# Patient Record
Sex: Male | Born: 1940 | Race: White | Hispanic: No | Marital: Married | Smoking: Former smoker
Health system: Southern US, Community
[De-identification: ages and names within clinical notes are randomized; demographics above are authoritative.]

## PROBLEM LIST (undated history)

## (undated) DIAGNOSIS — I639 Cerebral infarction, unspecified: Secondary | ICD-10-CM

## (undated) DIAGNOSIS — E039 Hypothyroidism, unspecified: Secondary | ICD-10-CM

## (undated) DIAGNOSIS — Z860100 Personal history of colon polyps, unspecified: Secondary | ICD-10-CM

## (undated) DIAGNOSIS — N200 Calculus of kidney: Secondary | ICD-10-CM

## (undated) DIAGNOSIS — R569 Unspecified convulsions: Secondary | ICD-10-CM

## (undated) DIAGNOSIS — M1990 Unspecified osteoarthritis, unspecified site: Secondary | ICD-10-CM

## (undated) DIAGNOSIS — Z87442 Personal history of urinary calculi: Secondary | ICD-10-CM

## (undated) HISTORY — PX: VASECTOMY: SHX75

## (undated) HISTORY — PX: ELBOW SURGERY: SHX618

## (undated) HISTORY — PX: TONSILLECTOMY: SUR1361

---

## 2004-10-31 ENCOUNTER — Ambulatory Visit: Payer: Self-pay | Admitting: Internal Medicine

## 2004-10-31 ENCOUNTER — Ambulatory Visit (HOSPITAL_COMMUNITY): Admission: RE | Admit: 2004-10-31 | Discharge: 2004-10-31 | Payer: Self-pay | Admitting: Internal Medicine

## 2005-11-08 ENCOUNTER — Ambulatory Visit: Payer: Self-pay | Admitting: Internal Medicine

## 2006-05-09 ENCOUNTER — Ambulatory Visit: Payer: Self-pay | Admitting: Internal Medicine

## 2009-03-10 ENCOUNTER — Ambulatory Visit: Payer: Self-pay | Admitting: Vascular Surgery

## 2009-11-08 ENCOUNTER — Encounter (INDEPENDENT_AMBULATORY_CARE_PROVIDER_SITE_OTHER): Payer: Self-pay

## 2010-08-23 NOTE — Letter (Signed)
Summary: Recall, Screening Colonoscopy Only  [facility]  [address]   [place], [place] [address]   Phone: [phone]  Fax: [phone]    November 08, 2009  [name] [address] [place], [place]  [address] 04/27/1941   Dear Mr. Teehan,   Our records indicate it is time to schedule your colonoscopy.  Please call our office at [phone] and ask for the nurse to get triaged and scheduled.   Thank you,    Hendricks Limes, LPN Cloria Spring, LPN  [facility] Ph: [phone]   Fax: [phone]

## 2010-12-06 NOTE — Procedures (Signed)
LOWER EXTREMITY VENOUS REFLUX EXAM   INDICATION:  Right lower extremity varicose vein.   EXAM:  Using color-flow imaging and pulse Doppler spectral analysis, the  right common femoral, superficial femoral, popliteal, posterior tibial,  greater and lesser saphenous veins are evaluated.  There is evidence  suggesting deep venous insufficiency in the right lower extremity.   The right saphenofemoral junction is not competent.  The right GSV is  not competent with the caliber as described below.   The right proximal short saphenous vein demonstrates competency.   GSV Diameter (used if found to be incompetent only)                                            Right    Left  Proximal Greater Saphenous Vein           0.9 cm   cm  Proximal-to-mid-thigh                     cm       cm  Mid thigh                                 0.63 cm  cm  Mid-distal thigh                          cm       cm  Distal thigh                              0.5 cm   cm  Knee                                      0.4 cm   cm   IMPRESSION:  1. Right greater saphenous vein reflux is identified as described      above and on the attached worksheet.  2. The right greater saphenous vein is not tortuous.  3. The deep venous system is not competent at the right superficial      femoral and popliteal vein levels.  4. The right lesser saphenous vein is not competent.   ___________________________________________  Larina Earthly, M.D.   CH/MEDQ  D:  03/10/2009  T:  03/11/2009  Job:  [id number]

## 2010-12-06 NOTE — Consult Note (Signed)
NEW PATIENT CONSULTATION   [name], [name]  DOB:  [birth year]                                       03/10/2009  CHART#:[id number]   [name] presents today for evaluation of right leg venous  varicosities.  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  He does have some mild  aching sensation over this but was more concerned as to whether or not  this was a sing of a more serious complication.  He does not have any  history of bleeding and does not have any history of deep venous  thrombosis.  He is quite active and he walks several miles several times  per week.   PAST MEDICAL HISTORY:  Negative for diabetes, cardiac disease, elevated  cholesterol.   FAMILY HISTORY:  Negative for premature atherosclerotic carotid disease.   SOCIAL HISTORY:  He is married with 2 children.  He is retired.  He quit  smoking 30 years ago.  He does not drink alcohol on a regular basis.   REVIEW OF SYSTEMS:  Noted for a weight of 178 pounds.  He is 5 foot 8  inches tall.  Negative cardiac, pulmonary, GI.  He does have urinary  frequency.  No history of seizures.   MEDICATION ALLERGIES:  None.   CURRENT MEDICATIONS:  Listed in his office chart.   PHYSICAL EXAM:  Blood pressure 130/68, pulse 56, respirations 18.  His  radial and dorsalis pedis pulse are 2+ bilaterally.  He does have a few  scattered varicosities in his left medial calf.  He does have marked  varicosities throughout his right medial calf and right distal thigh.  He does not have any skin changes of venous hypertension.   He underwent a focal venous duplex today and this reveals reflux  throughout his right great saphenous vein and he has mild reflux in his  right popliteal vein.  I discussed the significance of this with [name].  I explained that this is a slowly progressive  process.  I  explained the indications for fixing this is progressive pain or concern  regarding appearance.  He is not concerned regarding appearance and has  mild symptoms related to this currently.  He knows to look for signs of  progressive changes and will notify us should he wish further evaluation  and treatment.  He understands the importance of elevation when  possible.   Larina Earthly, M.D.  Electronically Signed   TFE/MEDQ  D:  03/10/2009  T:  03/11/2009  Job:  [id number]   cc:   Selinda Flavin

## 2010-12-09 NOTE — Op Note (Signed)
NAME:  [name], [name]                ACCOUNT NO.:  [id number]   MEDICAL RECORD NO.:  [id number]          PATIENT TYPE:  AMB   LOCATION:  DAY                           FACILITY:  [facility]   PHYSICIAN:  Lionel December, M.D.    DATE OF BIRTH:  1940/08/26   DATE OF PROCEDURE:  10/31/2004  DATE OF DISCHARGE:                                 OPERATIVE REPORT   PROCEDURE:  Colonoscopy.   INDICATION:  [name] is a 70 year old Caucasian male who is here for high-risk  screening colonoscopy. His last exam was six years ago. His mother who is  now 37, had surgery for colon carcinoma at age 26. He denies change in his  bowel habits or rectal bleeding. Procedure risks were reviewed with the  patient, informed consent was obtained.   PREMEDICATION:  Demerol 25 milligrams IV, Versed 5 milligrams IV in divided  dose.   FINDINGS:  Procedure performed in endoscopy suite. The patient's vital signs  and O2 sat were monitored during the procedure and remained stable. The  patient was placed left lateral position and rectal examination performed.  No abnormality noted external or digital exam. Olympus videoscope was placed  rectum and advanced under vision into sigmoid colon and beyond. Preparation  was excellent except he had some stool in the ascending colon and cecum.  Ileocecal valve was well seen. He had to be turnaround to move the stool  around and the blind end of the cecum was examined and no abnormality was  noted. Unable to clearly see the appendiceal orifice because of stool.  Pictures taken for the record. As the scope was withdrawn colonic mucosa was  carefully examined and was normal except two tiny diverticula at sigmoid  colon. Rectal mucosa was normal. Scope was retroflexed to examine anorectal  junction which was unremarkable. Endoscope was then withdrawn. The patient  tolerated the procedure well.   FINAL DIAGNOSES:  1.  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  2.  Examination of the cecum  was somewhat suboptimal because of formed      stool.   RECOMMENDATIONS:  Yearly Hemoccults.  High-fiber diet. He should have next  screening exam in no later than five years from now. October 30, 2004      NR/MEDQ  D:  10/31/2004  T:  10/31/2004  Job:  [id number]   cc:   Selinda Flavin  [address]  [place]  [place] [address]  Fax: [phone]

## 2011-07-31 DIAGNOSIS — E785 Hyperlipidemia, unspecified: Secondary | ICD-10-CM | POA: Diagnosis not present

## 2011-07-31 DIAGNOSIS — Z0000 Encounter for general adult medical examination without abnormal findings: Secondary | ICD-10-CM | POA: Diagnosis not present

## 2011-07-31 DIAGNOSIS — Z79899 Other long term (current) drug therapy: Secondary | ICD-10-CM | POA: Diagnosis not present

## 2011-07-31 DIAGNOSIS — N320 Bladder-neck obstruction: Secondary | ICD-10-CM | POA: Diagnosis not present

## 2011-08-10 ENCOUNTER — Encounter (INDEPENDENT_AMBULATORY_CARE_PROVIDER_SITE_OTHER): Payer: Self-pay | Admitting: *Deleted

## 2011-08-24 ENCOUNTER — Other Ambulatory Visit (INDEPENDENT_AMBULATORY_CARE_PROVIDER_SITE_OTHER): Payer: Self-pay | Admitting: *Deleted

## 2011-08-24 ENCOUNTER — Telehealth (INDEPENDENT_AMBULATORY_CARE_PROVIDER_SITE_OTHER): Payer: Self-pay | Admitting: *Deleted

## 2011-08-24 DIAGNOSIS — Z800 Family history of malignant neoplasm of digestive organs: Secondary | ICD-10-CM

## 2011-08-24 NOTE — Telephone Encounter (Signed)
Patient needs movi prep 

## 2011-08-25 MED ORDER — PEG-KCL-NACL-NASULF-NA ASC-C 100 G PO SOLR: 1.0000 | Freq: Once | ORAL | Status: DC

## 2011-09-19 ENCOUNTER — Encounter (INDEPENDENT_AMBULATORY_CARE_PROVIDER_SITE_OTHER): Payer: Self-pay | Admitting: *Deleted

## 2011-09-21 ENCOUNTER — Encounter (INDEPENDENT_AMBULATORY_CARE_PROVIDER_SITE_OTHER): Payer: Self-pay | Admitting: *Deleted

## 2011-09-21 DIAGNOSIS — J0100 Acute maxillary sinusitis, unspecified: Secondary | ICD-10-CM | POA: Diagnosis not present

## 2011-10-30 ENCOUNTER — Telehealth (INDEPENDENT_AMBULATORY_CARE_PROVIDER_SITE_OTHER): Payer: Self-pay | Admitting: *Deleted

## 2011-10-30 NOTE — Telephone Encounter (Signed)
Requesting MD: Dimas Aguas  PCP:  howard     Name & DOB: [name] [birth year]     Procedure: tcs  Reason/Indication:  fam hx colon ca  Has patient had this procedure before?  yes  If so, when, by whom and where?  2006  Is there a family history of colon cancer?  yes  Who?  What age when diagnosed?  mother  Is patient diabetic?   no      Does patient have prosthetic heart valve?  no  Do you have a pacemaker?  no  Has patient had joint replacement within last 12 months?  no  Is patient on Coumadin, Plavix and/or Aspirin? yes  Medications: asa 81 mg daily, zonegran 100 mg qid, oxcarbazepine 300 mg bid, multi vit   Allergies: nkda  Pharmacy: [facility]  Medication Adjustment: asa 2 days  Procedure date & time: 11/15/11 at 1030

## 2011-11-01 NOTE — Telephone Encounter (Signed)
agree

## 2011-11-14 MED ORDER — SODIUM CHLORIDE 0.45 % IV SOLN
Freq: Once | INTRAVENOUS | Status: AC
  Administered 2011-11-15: 10:00:00 via INTRAVENOUS

## 2011-11-15 ENCOUNTER — Encounter (HOSPITAL_COMMUNITY): Payer: Self-pay | Admitting: *Deleted

## 2011-11-15 ENCOUNTER — Encounter (HOSPITAL_COMMUNITY)
Admission: RE | Disposition: A | Discharge status: Home / Self Care | Payer: Self-pay | Source: Ambulatory Visit | Attending: Internal Medicine

## 2011-11-15 ENCOUNTER — Ambulatory Visit (HOSPITAL_COMMUNITY)
Admission: RE | Admit: 2011-11-15 | Discharge: 2011-11-15 | Disposition: A | Discharge status: Home / Self Care | Payer: Medicare Other | Source: Ambulatory Visit | Attending: Internal Medicine | Admitting: Internal Medicine

## 2011-11-15 DIAGNOSIS — Z7982 Long term (current) use of aspirin: Secondary | ICD-10-CM | POA: Diagnosis not present

## 2011-11-15 DIAGNOSIS — K644 Residual hemorrhoidal skin tags: Secondary | ICD-10-CM

## 2011-11-15 DIAGNOSIS — K5730 Diverticulosis of large intestine without perforation or abscess without bleeding: Secondary | ICD-10-CM

## 2011-11-15 DIAGNOSIS — Z1211 Encounter for screening for malignant neoplasm of colon: Secondary | ICD-10-CM | POA: Insufficient documentation

## 2011-11-15 DIAGNOSIS — D126 Benign neoplasm of colon, unspecified: Secondary | ICD-10-CM | POA: Insufficient documentation

## 2011-11-15 DIAGNOSIS — Z800 Family history of malignant neoplasm of digestive organs: Secondary | ICD-10-CM | POA: Insufficient documentation

## 2011-11-15 HISTORY — DX: Unspecified convulsions: R56.9

## 2011-11-15 HISTORY — PX: COLONOSCOPY: SHX5424

## 2011-11-15 SURGERY — COLONOSCOPY
Anesthesia: Moderate Sedation

## 2011-11-15 MED ORDER — MIDAZOLAM HCL 5 MG/5ML IJ SOLN
INTRAMUSCULAR | Status: AC
  Filled 2011-11-15: qty 10

## 2011-11-15 MED ORDER — MEPERIDINE HCL 50 MG/ML IJ SOLN
INTRAMUSCULAR | Status: DC | PRN
  Administered 2011-11-15 (×2): 25 mg via INTRAVENOUS

## 2011-11-15 MED ORDER — STERILE WATER FOR IRRIGATION IR SOLN
Status: DC | PRN
  Administered 2011-11-15: 12:00:00

## 2011-11-15 MED ORDER — MIDAZOLAM HCL 5 MG/5ML IJ SOLN
INTRAMUSCULAR | Status: DC | PRN
  Administered 2011-11-15: 1 mg via INTRAVENOUS
  Administered 2011-11-15: 2 mg via INTRAVENOUS

## 2011-11-15 MED ORDER — MEPERIDINE HCL 50 MG/ML IJ SOLN
INTRAMUSCULAR | Status: AC
  Filled 2011-11-15: qty 1

## 2011-11-15 NOTE — H&P (Signed)
[name] is an 71 y.o. male.   Chief Complaint: Patient is here for colonoscopy. HPI: This 71 year old Caucasian male who is in for a screening colonoscopy. His last exam was in April 2006. He denies abdominal pain change in his bowel habits or rectal bleeding. His family history significant for colon carcinoma in his mother with surgery at in her 67s and lived to be 40.  Past Medical History  Diagnosis Date  . Seizures     Past Surgical History  Procedure Date  . Tonsillectomy   . Elbow surgery     chipped bone right elbow  . Vasectomy     History reviewed. No pertinent family history. Social History:  does not have a smoking history on file. He does not have any smokeless tobacco history on file. He reports that he drinks about .6 ounces of alcohol per week. He reports that he does not use illicit drugs.  Allergies: Not on File  Medications Prior to Admission  Medication Sig Dispense Refill  . aspirin 81 MG tablet Take 81 mg by mouth daily.      . Oxcarbazepine (TRILEPTAL) 300 MG tablet Take 300 mg by mouth 2 (two) times daily.      Marland Kitchen zonisamide (ZONEGRAN) 100 MG capsule Take 400 mg by mouth daily.      . peg 3350 powder (MOVIPREP) 100 G SOLR Take 1 kit (100 g total) by mouth once.  1 kit  0    No results found for this or any previous visit (from the past 48 hour(s)). No results found.  Review of Systems  Constitutional: Negative for weight loss.  Gastrointestinal: Negative for abdominal pain, diarrhea, constipation, blood in stool and melena.    Blood pressure 125/82, pulse 70, temperature 97.6 F (36.4 C), temperature source Oral, resp. rate 19, height 5\' 8"  (1.727 m), weight 177 lb (80.287 kg). Physical Exam  Constitutional: He appears well-developed and well-nourished.  HENT:  Mouth/Throat: Oropharynx is clear and moist.  Eyes: Conjunctivae are normal. No scleral icterus.  Neck: No thyromegaly present.  Cardiovascular: Normal rate, regular rhythm and normal  heart sounds.   No murmur heard. Respiratory: Effort normal and breath sounds normal.  GI: He exhibits no distension and no mass. There is no tenderness.  Musculoskeletal: He exhibits no edema.  Lymphadenopathy:    He has no cervical adenopathy.  Neurological: He is alert.  Skin: Skin is warm and dry.     Assessment/Plan High-risk screening colonoscopy  Joely Losier U 11/15/2011, 11:38 AM

## 2011-11-15 NOTE — Discharge Instructions (Signed)
No aspirin for one week. Resume other medications as before. Resume usual diet. No driving for 24 hours. Physician Will contact him with biopsy results.  Colonoscopy Care After Read the instructions outlined below and refer to this sheet in the next few weeks. These discharge instructions provide you with general information on caring for yourself after you leave the hospital. Your doctor may also give you specific instructions. While your treatment has been planned according to the most current medical practices available, unavoidable complications occasionally occur. If you have any problems or questions after discharge, call your doctor. HOME CARE INSTRUCTIONS ACTIVITY:  You may resume your regular activity, but move at a slower pace for the next 24 hours.   Take frequent rest periods for the next 24 hours.   Walking will help get rid of the air and reduce the bloated feeling in your belly (abdomen).   No driving for 24 hours (because of the medicine (anesthesia) used during the test).   You may shower.   Do not sign any important legal documents or operate any machinery for 24 hours (because of the anesthesia used during the test).  NUTRITION:  Drink plenty of fluids.   You may resume your normal diet as instructed by your doctor.   Begin with a light meal and progress to your normal diet. Heavy or fried foods are harder to digest and may make you feel sick to your stomach (nauseated).   Avoid alcoholic beverages for 24 hours or as instructed.  MEDICATIONS:  You may resume your normal medications unless your doctor tells you otherwise.  WHAT TO EXPECT TODAY:  Some feelings of bloating in the abdomen.   Passage of more gas than usual.   Spotting of blood in your stool or on the toilet paper.  IF YOU HAD POLYPS REMOVED DURING THE COLONOSCOPY:  No aspirin products for 7 days or as instructed.   No alcohol for 7 days or as instructed.   Eat a soft diet for the next 24  hours.  FINDING OUT THE RESULTS OF YOUR TEST Not all test results are available during your visit. If your test results are not back during the visit, make an appointment with your caregiver to find out the results. Do not assume everything is normal if you have not heard from your caregiver or the medical facility. It is important for you to follow up on all of your test results.  SEEK IMMEDIATE MEDICAL CARE IF:  You have more than a spotting of blood in your stool.   Your belly is swollen (abdominal distention).   You are nauseated or vomiting.   You have a fever.   You have abdominal pain or discomfort that is severe or gets worse throughout the day.  Document Released: 02/22/2004 Document Revised: 06/29/2011 Document Reviewed: 02/20/2008 [facility] Patient Information 2012 [place], [place].

## 2011-11-15 NOTE — Op Note (Signed)
COLONOSCOPY PROCEDURE REPORT  PATIENT:  [name]  MR#:  [id number] Birthdate:  [birth year], 71 y.o., male Endoscopist:  Dr. Malissa Hippo, MD Referred By:  Dr. Gillie Manners. Dimas Aguas, MD Procedure Date: 11/15/2011  Procedure:   Colonoscopy with snare polypectomy.  Indications:  Patient is a 71 year old Caucasian male who is undergoing high-risk screening colonoscopy. Patient's mother was diagnosed with colon carcinoma in her 25s. Patient's last exam was in April 2006.  Informed Consent:  The procedure and risks were reviewed with the patient and informed consent was obtained.  Medications:  Demerol 50 mg IV Versed 3 mg IV  Description of procedure:  After a digital rectal exam was performed, that colonoscope was advanced from the anus through the rectum and colon to the area of the cecum, ileocecal valve and appendiceal orifice. The cecum was deeply intubated. These structures were well-seen and photographed for the record. From the level of the cecum and ileocecal valve, the scope was slowly and cautiously withdrawn. The mucosal surfaces were carefully surveyed utilizing scope tip to flexion to facilitate fold flattening as needed. The scope was pulled down into the rectum where a thorough exam including retroflexion was performed.  Findings:   Prep satisfactory. 6 mm polyp snared from proximal transverse colon. Most of the polyp coagulated during this process. Another 6 mm polyp snared from proximal transverse colon and once again most of the polyp was coagulated during this process. Tissue from both of these polyps weren't submitted together. Small polyp at splenic flexure was coagulated using snare tip. Few small diverticula and sigmoid colon. Small hemorrhoids below the dentate line.  Therapeutic/Diagnostic Maneuvers Performed:  See above  Complications:  None  Cecal Withdrawal Time:  22 minutes  Impression:  Examination performed to cecum. Two small polyps were snared from  proximal transverse colon mostly coagulated. Tissue from both of these polyps was submitted together. Another small polyp at splenic flexure was coagulated. Few small diverticula sigmoid colon and external hemorrhoids.  Recommendations:  Standard instructions given. I will be contacting patient with results of biopsy.  Laketha Leopard U  11/15/2011 12:20 PM  CC: Dr. Criss Rosales, MD, MD & Dr. Bonnetta Barry ref. provider found

## 2011-11-17 ENCOUNTER — Encounter (HOSPITAL_COMMUNITY): Payer: Self-pay | Admitting: Internal Medicine

## 2011-11-22 ENCOUNTER — Encounter (INDEPENDENT_AMBULATORY_CARE_PROVIDER_SITE_OTHER): Payer: Self-pay | Admitting: *Deleted

## 2012-03-22 DIAGNOSIS — R42 Dizziness and giddiness: Secondary | ICD-10-CM | POA: Diagnosis not present

## 2012-04-05 DIAGNOSIS — I658 Occlusion and stenosis of other precerebral arteries: Secondary | ICD-10-CM | POA: Diagnosis not present

## 2012-04-05 DIAGNOSIS — G459 Transient cerebral ischemic attack, unspecified: Secondary | ICD-10-CM | POA: Diagnosis not present

## 2012-06-28 DIAGNOSIS — Z23 Encounter for immunization: Secondary | ICD-10-CM | POA: Diagnosis not present

## 2012-07-03 DIAGNOSIS — N200 Calculus of kidney: Secondary | ICD-10-CM | POA: Diagnosis not present

## 2012-07-03 DIAGNOSIS — I517 Cardiomegaly: Secondary | ICD-10-CM | POA: Diagnosis not present

## 2012-07-03 DIAGNOSIS — K4020 Bilateral inguinal hernia, without obstruction or gangrene, not specified as recurrent: Secondary | ICD-10-CM | POA: Diagnosis not present

## 2012-07-03 DIAGNOSIS — Z7982 Long term (current) use of aspirin: Secondary | ICD-10-CM | POA: Diagnosis not present

## 2012-07-03 DIAGNOSIS — Z79899 Other long term (current) drug therapy: Secondary | ICD-10-CM | POA: Diagnosis not present

## 2012-07-04 DIAGNOSIS — N200 Calculus of kidney: Secondary | ICD-10-CM | POA: Diagnosis not present

## 2012-07-04 DIAGNOSIS — K4020 Bilateral inguinal hernia, without obstruction or gangrene, not specified as recurrent: Secondary | ICD-10-CM | POA: Diagnosis not present

## 2012-07-05 DIAGNOSIS — R300 Dysuria: Secondary | ICD-10-CM | POA: Diagnosis not present

## 2012-07-05 DIAGNOSIS — R112 Nausea with vomiting, unspecified: Secondary | ICD-10-CM | POA: Diagnosis not present

## 2012-07-05 DIAGNOSIS — R1011 Right upper quadrant pain: Secondary | ICD-10-CM | POA: Diagnosis not present

## 2012-07-05 DIAGNOSIS — K828 Other specified diseases of gallbladder: Secondary | ICD-10-CM | POA: Diagnosis not present

## 2012-07-10 ENCOUNTER — Encounter (INDEPENDENT_AMBULATORY_CARE_PROVIDER_SITE_OTHER): Payer: Self-pay

## 2012-07-11 ENCOUNTER — Encounter (INDEPENDENT_AMBULATORY_CARE_PROVIDER_SITE_OTHER): Payer: Self-pay | Admitting: General Surgery

## 2012-07-12 ENCOUNTER — Telehealth (INDEPENDENT_AMBULATORY_CARE_PROVIDER_SITE_OTHER): Payer: Self-pay | Admitting: General Surgery

## 2012-07-12 ENCOUNTER — Encounter (INDEPENDENT_AMBULATORY_CARE_PROVIDER_SITE_OTHER): Payer: Self-pay | Admitting: General Surgery

## 2012-07-12 ENCOUNTER — Ambulatory Visit (INDEPENDENT_AMBULATORY_CARE_PROVIDER_SITE_OTHER): Payer: Medicare Other | Admitting: General Surgery

## 2012-07-12 VITALS — BP 118/82 | HR 66 | Temp 98.3°F | Resp 16 | Ht 68.0 in | Wt 178.4 lb

## 2012-07-12 DIAGNOSIS — R1011 Right upper quadrant pain: Secondary | ICD-10-CM | POA: Insufficient documentation

## 2012-07-12 NOTE — Progress Notes (Signed)
Dr. Derrell Lolling: when you can please, we need orders put in [facility] on Dominiq Sunderlin Surg is 07/26/11 - coming for preop 07/19/12 Thank you

## 2012-07-12 NOTE — Telephone Encounter (Signed)
Called to see if patient could come in sooner for today's appt per Dr.Ramirez..called home and mobile phone lmom

## 2012-07-12 NOTE — Progress Notes (Signed)
Patient ID: [name], male   DOB: 04/19/1941, 71 y.o.   MRN: [id number]  Chief Complaint  Patient presents with  . Abdominal Pain    Evaluate gallbladder    HPI [name] is a 71 y.o. male.  Who is referred by Dr. Howard for evaluation secondary to right quadrant pain. Patient procedure ED secondary to midback pain was evaluated for possible cardiac event which is negative.  The patient is a CT scan which revealed no stones, and the patient was followed up by his PCP and underwent ultrasound which revealed thickening of the gallbladder. Patient also states that he had labs that showed symptoms of possible cholelithiasis, the laps were not sent did not have access to see.  The patient after pain had soreness in his right upper quadrant. He states this was his first occurrence. HPI  Past Medical History  Diagnosis Date  . Seizures     Past Surgical History  Procedure Date  . Tonsillectomy   . Elbow surgery     chipped bone right elbow  . Vasectomy   . Colonoscopy 11/15/2011    Procedure: COLONOSCOPY;  Surgeon: Najeeb U Rehman, MD;  Location: [facility];  Service: Endoscopy;  Laterality: N/A;  730    No family history on file.  Social History History  Substance Use Topics  . Smoking status: Never Smoker   . Smokeless tobacco: Not on file  . Alcohol Use: 0.6 oz/week    1 Glasses of wine per week     Comment: mixed drink  once in awhile    No Known Allergies  Current Outpatient Prescriptions  Medication Sig Dispense Refill  . aspirin 81 MG tablet Take 81 mg by mouth daily.      . fluticasone (FLONASE) 50 MCG/ACT nasal spray Place 2 sprays into the nose daily.      . multivitamin-iron-minerals-folic acid (CENTRUM) chewable tablet Chew 1 tablet by mouth daily.      . Oxcarbazepine (TRILEPTAL) 300 MG tablet Take 300 mg by mouth 2 (two) times daily.      . zonisamide (ZONEGRAN) 100 MG capsule Take 400 mg by mouth daily.        Review of Systems Review of Systems    Constitutional: Negative.   HENT: Negative.   Respiratory: Negative.   Cardiovascular: Negative.   Gastrointestinal: Negative.   Neurological: Negative.     Blood pressure 118/82, pulse 66, temperature 98.3 F (36.8 C), temperature source Temporal, resp. rate 16, height 5' 8" (1.727 m), weight 178 lb 6.4 oz (80.922 kg).  Physical Exam Physical Exam  Constitutional: He is oriented to person, place, and time. He appears well-developed and well-nourished.  HENT:  Head: Normocephalic and atraumatic.  Eyes: Conjunctivae normal and EOM are normal. Pupils are equal, round, and reactive to light.  Neck: Normal range of motion. Neck supple.  Cardiovascular: Normal rate and normal heart sounds.   Pulmonary/Chest: Effort normal and breath sounds normal.  Abdominal: Soft. Bowel sounds are normal. There is no tenderness. There is no rebound.  Musculoskeletal: Normal range of motion.  Neurological: He is alert and oriented to person, place, and time.    Data Reviewed Ultrasound:revealed gallbladder wall thickening no stones or sludge per ultrasound.  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    Plan    1. After discussing options with the patient the patient would like to proceed preoperative for laparoscopic   cholecystectomy. I discussed with the patient that this may not resolve his right quadrant pain or pain he had to come to the ED. He voiced understanding and wished to proceed.  2.All risks and benefits were discussed with the patient, to generally include infection, bleeding, damage to surrounding structures, and recurrence. Alternatives were offered and described.  All questions were answered and the patient voiced understanding of the procedure and wishes to proceed at this point.        Maijor Hornig Jr., [name] 07/12/2012, 2:28 PM    

## 2012-07-15 ENCOUNTER — Other Ambulatory Visit (INDEPENDENT_AMBULATORY_CARE_PROVIDER_SITE_OTHER): Payer: Self-pay | Admitting: General Surgery

## 2012-07-16 ENCOUNTER — Encounter (HOSPITAL_COMMUNITY): Payer: Self-pay | Admitting: Pharmacy Technician

## 2012-07-19 ENCOUNTER — Encounter (HOSPITAL_COMMUNITY)
Admission: RE | Admit: 2012-07-19 | Discharge: 2012-07-19 | Disposition: A | Discharge status: Home / Self Care | Payer: Medicare Other | Source: Ambulatory Visit | Attending: General Surgery | Admitting: General Surgery

## 2012-07-19 ENCOUNTER — Encounter (HOSPITAL_COMMUNITY): Payer: Self-pay

## 2012-07-19 DIAGNOSIS — K8010 Calculus of gallbladder with chronic cholecystitis without obstruction: Secondary | ICD-10-CM | POA: Diagnosis not present

## 2012-07-19 DIAGNOSIS — Z01812 Encounter for preprocedural laboratory examination: Secondary | ICD-10-CM | POA: Diagnosis not present

## 2012-07-19 HISTORY — DX: Personal history of colonic polyps: Z86.010

## 2012-07-19 HISTORY — DX: Personal history of colon polyps, unspecified: Z86.0100

## 2012-07-19 HISTORY — DX: Calculus of kidney: N20.0

## 2012-07-19 LAB — CBC
MCH: 30 pg (ref 26.0–34.0)
MCV: 89.5 fL (ref 78.0–100.0)
Platelets: 329 10*3/uL (ref 150–400)
RDW: 12.6 % (ref 11.5–15.5)
WBC: 7.7 10*3/uL (ref 4.0–10.5)

## 2012-07-19 LAB — BASIC METABOLIC PANEL
Calcium: 10.1 mg/dL (ref 8.4–10.5)
Chloride: 105 mEq/L (ref 96–112)
Creatinine, Ser: 1.02 mg/dL (ref 0.50–1.35)
GFR calc Af Amer: 85 mL/min — ABNORMAL LOW (ref >90)
GFR calc non Af Amer: 73 mL/min — ABNORMAL LOW (ref >90)

## 2012-07-19 NOTE — Patient Instructions (Addendum)
20 [name]  07/19/2012   Your procedure is scheduled on:  1-2 -2014  Report to [facility] at   0700     AM .  Call this number if you have problems the morning of surgery: [phone]  Or Presurgical Testing [phone](Savera Donson)   Remember: Follow any bowel prep instructions per MD office. For Cpap use: Bring mask and tubing only.   Do not eat food:After Midnight.    Take these medicines the morning of surgery with A SIP OF WATER: Trileptal, Zonegram   Do not wear jewelry, make-up or nail polish.  Do not wear lotions, powders, or perfumes. You may wear deodorant.  Do not shave 48 hours prior to surgery.(face and neck okay, no shaving of legs)  Do not bring valuables to the hospital.  Contacts, dentures or bridgework,body piercing,  may not be worn into surgery.  Leave suitcase in the car. After surgery it may be brought to your room.  For patients admitted to the hospital, checkout time is 11:00 AM the day of discharge.   Patients discharged the day of surgery will not be allowed to drive home. Must have responsible person with you x 24 hours once discharged.  Name and phone number of your driver: [phone] spouse -Psychologist, sport and exercise  Special Instructions: CHG Shower Use Special Wash: see special instructions.(avoid face and genitals)   Please read over the following fact sheets that you were given: MRSA Information.    Failure to follow these instructions may result in Cancellation of your surgery.   Patient signature_______________________________________________________

## 2012-07-19 NOTE — Pre-Procedure Instructions (Addendum)
07-19-12 EKG(07-03-12) with chart. 07-19-12 1550 pt. Notified Positive PCR screen for Staph aureus- will use Mupirocin Oint  As directed. W. Kennon Portela

## 2012-07-24 MED ORDER — CEFAZOLIN SODIUM-DEXTROSE 2-3 GM-% IV SOLR
2.0000 g | INTRAVENOUS | Status: AC
  Administered 2012-07-25: 2 g via INTRAVENOUS

## 2012-07-25 ENCOUNTER — Ambulatory Visit (HOSPITAL_COMMUNITY): Payer: Medicare Other | Admitting: Anesthesiology

## 2012-07-25 ENCOUNTER — Ambulatory Visit (HOSPITAL_COMMUNITY)
Admission: RE | Admit: 2012-07-25 | Discharge: 2012-07-25 | Disposition: A | Discharge status: Home / Self Care | Payer: Medicare Other | Source: Ambulatory Visit | Attending: General Surgery | Admitting: General Surgery

## 2012-07-25 ENCOUNTER — Ambulatory Visit (HOSPITAL_COMMUNITY): Payer: Medicare Other

## 2012-07-25 ENCOUNTER — Encounter (HOSPITAL_COMMUNITY): Payer: Self-pay | Admitting: Anesthesiology

## 2012-07-25 ENCOUNTER — Encounter (HOSPITAL_COMMUNITY): Payer: Self-pay | Admitting: *Deleted

## 2012-07-25 ENCOUNTER — Encounter (HOSPITAL_COMMUNITY)
Admission: RE | Disposition: A | Discharge status: Home / Self Care | Payer: Self-pay | Source: Ambulatory Visit | Attending: General Surgery

## 2012-07-25 DIAGNOSIS — K8010 Calculus of gallbladder with chronic cholecystitis without obstruction: Secondary | ICD-10-CM | POA: Insufficient documentation

## 2012-07-25 DIAGNOSIS — K838 Other specified diseases of biliary tract: Secondary | ICD-10-CM | POA: Diagnosis not present

## 2012-07-25 DIAGNOSIS — Z01812 Encounter for preprocedural laboratory examination: Secondary | ICD-10-CM | POA: Diagnosis not present

## 2012-07-25 DIAGNOSIS — K828 Other specified diseases of gallbladder: Secondary | ICD-10-CM | POA: Diagnosis not present

## 2012-07-25 HISTORY — PX: CHOLECYSTECTOMY: SHX55

## 2012-07-25 SURGERY — LAPAROSCOPIC CHOLECYSTECTOMY WITH INTRAOPERATIVE CHOLANGIOGRAM
Anesthesia: General | Wound class: Clean Contaminated

## 2012-07-25 MED ORDER — NEOSTIGMINE METHYLSULFATE 1 MG/ML IJ SOLN
INTRAMUSCULAR | Status: DC | PRN
  Administered 2012-07-25: 4 mg via INTRAVENOUS

## 2012-07-25 MED ORDER — OXYCODONE-ACETAMINOPHEN 5-325 MG PO TABS: 1.0000 | ORAL_TABLET | ORAL | Status: DC | PRN

## 2012-07-25 MED ORDER — FENTANYL CITRATE 0.05 MG/ML IJ SOLN
25.0000 ug | INTRAMUSCULAR | Status: DC | PRN
  Administered 2012-07-25 (×3): 50 ug via INTRAVENOUS

## 2012-07-25 MED ORDER — ONDANSETRON HCL 4 MG/2ML IJ SOLN: 4.0000 mg | Freq: Four times a day (QID) | INTRAMUSCULAR | Status: DC | PRN

## 2012-07-25 MED ORDER — ONDANSETRON HCL 4 MG/2ML IJ SOLN
INTRAMUSCULAR | Status: DC | PRN
  Administered 2012-07-25: 4 mg via INTRAVENOUS

## 2012-07-25 MED ORDER — ACETAMINOPHEN 10 MG/ML IV SOLN
INTRAVENOUS | Status: AC
  Filled 2012-07-25: qty 100

## 2012-07-25 MED ORDER — GLYCOPYRROLATE 0.2 MG/ML IJ SOLN
INTRAMUSCULAR | Status: DC | PRN
  Administered 2012-07-25: 0.6 mg via INTRAVENOUS

## 2012-07-25 MED ORDER — LACTATED RINGERS IV SOLN
INTRAVENOUS | Status: DC
  Administered 2012-07-25: 09:00:00 via INTRAVENOUS
  Administered 2012-07-25: 1000 mL via INTRAVENOUS

## 2012-07-25 MED ORDER — SODIUM CHLORIDE 0.9 % IJ SOLN: 3.0000 mL | Freq: Two times a day (BID) | INTRAMUSCULAR | Status: DC

## 2012-07-25 MED ORDER — FENTANYL CITRATE 0.05 MG/ML IJ SOLN
INTRAMUSCULAR | Status: DC | PRN
  Administered 2012-07-25 (×2): 50 ug via INTRAVENOUS

## 2012-07-25 MED ORDER — CISATRACURIUM BESYLATE (PF) 10 MG/5ML IV SOLN
INTRAVENOUS | Status: DC | PRN
  Administered 2012-07-25: 6 mg via INTRAVENOUS

## 2012-07-25 MED ORDER — SODIUM CHLORIDE 0.9 % IV SOLN: 250.0000 mL | INTRAVENOUS | Status: DC | PRN

## 2012-07-25 MED ORDER — LACTATED RINGERS IR SOLN
Status: DC | PRN
  Administered 2012-07-25: 1000 mL

## 2012-07-25 MED ORDER — BUPIVACAINE-EPINEPHRINE PF 0.25-1:200000 % IJ SOLN
INTRAMUSCULAR | Status: AC
  Filled 2012-07-25: qty 30

## 2012-07-25 MED ORDER — EPHEDRINE SULFATE 50 MG/ML IJ SOLN
INTRAMUSCULAR | Status: DC | PRN
  Administered 2012-07-25: 7.5 mg via INTRAVENOUS

## 2012-07-25 MED ORDER — FENTANYL CITRATE 0.05 MG/ML IJ SOLN
INTRAMUSCULAR | Status: AC
  Filled 2012-07-25: qty 2

## 2012-07-25 MED ORDER — IOHEXOL 300 MG/ML  SOLN
INTRAMUSCULAR | Status: AC
  Filled 2012-07-25: qty 1

## 2012-07-25 MED ORDER — PROPOFOL 10 MG/ML IV BOLUS
INTRAVENOUS | Status: DC | PRN
  Administered 2012-07-25: 150 mg via INTRAVENOUS

## 2012-07-25 MED ORDER — IOHEXOL 300 MG/ML  SOLN
INTRAMUSCULAR | Status: DC | PRN
  Administered 2012-07-25: 8 mL

## 2012-07-25 MED ORDER — ACETAMINOPHEN 325 MG PO TABS: 650.0000 mg | ORAL_TABLET | ORAL | Status: DC | PRN

## 2012-07-25 MED ORDER — ACETAMINOPHEN 650 MG RE SUPP
650.0000 mg | RECTAL | Status: DC | PRN
  Filled 2012-07-25: qty 1

## 2012-07-25 MED ORDER — PROMETHAZINE HCL 25 MG/ML IJ SOLN: 6.2500 mg | INTRAMUSCULAR | Status: DC | PRN

## 2012-07-25 MED ORDER — SODIUM CHLORIDE 0.9 % IJ SOLN: 3.0000 mL | INTRAMUSCULAR | Status: DC | PRN

## 2012-07-25 MED ORDER — BUPIVACAINE-EPINEPHRINE PF 0.25-1:200000 % IJ SOLN
INTRAMUSCULAR | Status: DC | PRN
  Administered 2012-07-25: 14 mL

## 2012-07-25 MED ORDER — KETOROLAC TROMETHAMINE 30 MG/ML IJ SOLN: 15.0000 mg | Freq: Once | INTRAMUSCULAR | Status: DC | PRN

## 2012-07-25 MED ORDER — OXYCODONE HCL 5 MG PO TABS
5.0000 mg | ORAL_TABLET | ORAL | Status: DC | PRN
  Administered 2012-07-25: 5 mg via ORAL
  Filled 2012-07-25: qty 1

## 2012-07-25 MED ORDER — SUCCINYLCHOLINE CHLORIDE 20 MG/ML IJ SOLN
INTRAMUSCULAR | Status: DC | PRN
  Administered 2012-07-25: 100 mg via INTRAVENOUS

## 2012-07-25 MED ORDER — CEFAZOLIN SODIUM-DEXTROSE 2-3 GM-% IV SOLR
INTRAVENOUS | Status: AC
  Filled 2012-07-25: qty 50

## 2012-07-25 MED ORDER — MIDAZOLAM HCL 5 MG/5ML IJ SOLN
INTRAMUSCULAR | Status: DC | PRN
  Administered 2012-07-25: 2 mg via INTRAVENOUS

## 2012-07-25 MED ORDER — ACETAMINOPHEN 10 MG/ML IV SOLN
INTRAVENOUS | Status: DC | PRN
  Administered 2012-07-25: 1000 mg via INTRAVENOUS

## 2012-07-25 SURGICAL SUPPLY — 47 items
APL SKNCLS STERI-STRIP NONHPOA (GAUZE/BANDAGES/DRESSINGS) ×2
APPLIER CLIP 5 13 M/L LIGAMAX5 (MISCELLANEOUS) ×3
APR CLP MED LRG 5 ANG JAW (MISCELLANEOUS) ×2
BAG SPEC RTRVL LRG 6X4 10 (ENDOMECHANICALS)
BENZOIN TINCTURE PRP APPL 2/3 (GAUZE/BANDAGES/DRESSINGS) ×3 IMPLANT
CABLE HIGH FREQUENCY MONO STRZ (ELECTRODE) ×3 IMPLANT
CANISTER SUCTION 2500CC (MISCELLANEOUS) ×3 IMPLANT
CHLORAPREP W/TINT 26ML (MISCELLANEOUS) ×3 IMPLANT
CLIP APPLIE 5 13 M/L LIGAMAX5 (MISCELLANEOUS) ×2 IMPLANT
CLOTH BEACON ORANGE TIMEOUT ST (SAFETY) ×3 IMPLANT
COVER MAYO STAND STRL (DRAPES) ×2 IMPLANT
DECANTER SPIKE VIAL GLASS SM (MISCELLANEOUS) ×3 IMPLANT
DEVICE TROCAR PUNCTURE CLOSURE (ENDOMECHANICALS) ×3 IMPLANT
DRAPE C-ARM 42X72 X-RAY (DRAPES) ×2 IMPLANT
DRAPE LAPAROSCOPIC ABDOMINAL (DRAPES) ×3 IMPLANT
ELECT REM PT RETURN 9FT ADLT (ELECTROSURGICAL) ×3
ELECTRODE REM PT RTRN 9FT ADLT (ELECTROSURGICAL) ×2 IMPLANT
GAUZE SPONGE 2X2 8PLY STRL LF (GAUZE/BANDAGES/DRESSINGS) ×2 IMPLANT
GLOVE BIO SURGEON STRL SZ7.5 (GLOVE) ×3 IMPLANT
GLOVE BIOGEL PI IND STRL 7.0 (GLOVE) ×1 IMPLANT
GLOVE BIOGEL PI INDICATOR 7.0 (GLOVE) ×1
GLOVE SURG SS PI 6.5 STRL IVOR (GLOVE) ×2 IMPLANT
GLOVE SURG SS PI 7.0 STRL IVOR (GLOVE) ×2 IMPLANT
GOWN STRL NON-REIN LRG LVL3 (GOWN DISPOSABLE) ×3 IMPLANT
GOWN STRL REIN XL XLG (GOWN DISPOSABLE) ×6 IMPLANT
HEMOSTAT SURGICEL 4X8 (HEMOSTASIS) IMPLANT
KIT BASIN OR (CUSTOM PROCEDURE TRAY) ×3 IMPLANT
NDL INSUFFLATION 14GA 120MM (NEEDLE) ×1 IMPLANT
NEEDLE INSUFFLATION 14GA 120MM (NEEDLE) ×3 IMPLANT
NS IRRIG 1000ML POUR BTL (IV SOLUTION) IMPLANT
POUCH SPECIMEN RETRIEVAL 10MM (ENDOMECHANICALS) IMPLANT
SCISSORS LAP 5X35 DISP (ENDOMECHANICALS) ×3 IMPLANT
SET CHOLANGIOGRAPH MIX (MISCELLANEOUS) IMPLANT
SET IRRIG TUBING LAPAROSCOPIC (IRRIGATION / IRRIGATOR) ×3 IMPLANT
SOLUTION ANTI FOG 6CC (MISCELLANEOUS) ×3 IMPLANT
SPONGE GAUZE 2X2 STER 10/PKG (GAUZE/BANDAGES/DRESSINGS) ×1
SPONGE GAUZE 4X4 12PLY (GAUZE/BANDAGES/DRESSINGS) ×3 IMPLANT
STRIP CLOSURE SKIN 1/2X4 (GAUZE/BANDAGES/DRESSINGS) ×3 IMPLANT
SUT MNCRL AB 4-0 PS2 18 (SUTURE) ×3 IMPLANT
SUT VIC AB 0 CTX 27 (SUTURE) ×2 IMPLANT
TAPE CLOTH SURG 4X10 WHT LF (GAUZE/BANDAGES/DRESSINGS) ×2 IMPLANT
TOWEL OR 17X26 10 PK STRL BLUE (TOWEL DISPOSABLE) ×3 IMPLANT
TRAY LAP CHOLE (CUSTOM PROCEDURE TRAY) ×3 IMPLANT
TROCAR BLADELESS OPT 5 75 (ENDOMECHANICALS) ×5 IMPLANT
TROCAR SLEEVE XCEL 5X75 (ENDOMECHANICALS) ×4 IMPLANT
TROCAR XCEL NON-BLD 11X100MML (ENDOMECHANICALS) ×3 IMPLANT
TUBING INSUFFLATION 10FT LAP (TUBING) ×3 IMPLANT

## 2012-07-25 NOTE — Interval H&P Note (Signed)
History and Physical Interval Note:  07/25/2012 8:21 AM  [name]  has presented today for surgery, with the diagnosis of bilinary dyskensia  The various methods of treatment have been discussed with the patient and family. After consideration of risks, benefits and other options for treatment, the patient has consented to  Procedure(s) (LRB) with comments: LAPAROSCOPIC CHOLECYSTECTOMY (N/A) as a surgical intervention .  The patient's history has been reviewed, patient examined, no change in status, stable for surgery.  I have reviewed the patient's chart and labs.  Questions were answered to the patient's satisfaction.     Marigene Ehlers., Jed Limerick

## 2012-07-25 NOTE — Transfer of Care (Signed)
Immediate Anesthesia Transfer of Care Note  Patient: [name]  Procedure(s) Performed: Procedure(s) (LRB) with comments: LAPAROSCOPIC CHOLECYSTECTOMY WITH INTRAOPERATIVE CHOLANGIOGRAM ()  Patient Location: PACU  Anesthesia Type:General  Level of Consciousness: awake, oriented, sedated and patient cooperative  Airway & Oxygen Therapy: Patient Spontanous Breathing and Patient connected to face mask oxygen  Post-op Assessment: Report given to PACU RN and Post -op Vital signs reviewed and stable  Post vital signs: Reviewed and stable  Complications: No apparent anesthesia complications

## 2012-07-25 NOTE — Op Note (Signed)
Pre Operative Diagnosis: sx gallstones  Post Operative Diagnosis: same  Surgeon: Dr. Axel Filler   Procedure: lap chole with IOC  Assistant: none  Anesthesia: Gen. Endotracheal anesthesia   EBL: 5cc  Complications:  Counts: reported as correct x 2   Findings: The patient had normal IOC  Indications for procedure: Pt is a 72 y/o M who prev presented to the ED for cardiac issues.  He was cleared from this and was seen to have Gallstones on [place].  Pt was seen in clinic and decided to have this electively removed.  Details of the procedure:  The patient was taken to the operating and placed in the supine position with bilateral SCDs in place. A time out was called and all facts were verified. A pneumoperitoneum was obtained via A Veress needle technique to a pressure of 14mm of mercury. A 5mm trochar was then placed in the right upper quadrant under visualization, and there were no injuries to any abdominal organs. A 11 mm port was then placed in the umbilical region after infiltrating with local anesthesia under direct visualization. A second and third epigastric port and right lower quadrant port placement under direct visualization, respectively. The gallbladder was identified and retracted, the peritoneum was then sharply dissected from the gallbladder and this dissection was carried down to Calot's triangle. The gallbladder was identified and stripped away circumferentially and seen going into the gallbladder 360. A Cook catheter was used to perform an intraoperative cholangiogram. The biliary radicals as well as the cystic duct and common bile duct were seen free of filling defects.  2 clips were placed proximally one distally and the cystic duct transected. The cystic artery was identified and 2 clips placed proximally and one distally and transected.  We then proceeded to remove the gallbladder off the hepatic fossa with Bovie cautery. A latex retrieval bag was then placed in the  abdomen and gallbladder placed in the bag. The hepatic fossa was then reexamined and hemostasis was achieved with Bovie cautery and was excellent at the end of the case. The subhepatic fossa and perihepatic fossa was then irrigated until the effluent was clear. The 11 mm trocar fascia was reapproximated with the Endo Close #1 Vicryl.  The pneumoperitoneum was evacuated and all trochars removed under direct visulalization.  The skin was then closed with 4-0 Monocryl and the skin dressed with Steri-Strips, gauze, and tape.  The patient was awaken from general anesthesia and taken to the recovery room in stable condition.

## 2012-07-25 NOTE — Anesthesia Postprocedure Evaluation (Signed)
  Anesthesia Post-op Note  Patient: [name]  Procedure(s) Performed: Procedure(s) (LRB): LAPAROSCOPIC CHOLECYSTECTOMY WITH INTRAOPERATIVE CHOLANGIOGRAM ()  Patient Location: PACU  Anesthesia Type: General  Level of Consciousness: awake and alert   Airway and Oxygen Therapy: Patient Spontanous Breathing  Post-op Pain: mild  Post-op Assessment: Post-op Vital signs reviewed, Patient's Cardiovascular Status Stable, Respiratory Function Stable, Patent Airway and No signs of Nausea or vomiting  Last Vitals:  Filed Vitals:   07/25/12 1039  BP:   Pulse: 71  Temp:   Resp: 17    Post-op Vital Signs: stable   Complications: No apparent anesthesia complications

## 2012-07-25 NOTE — Anesthesia Preprocedure Evaluation (Addendum)
Anesthesia Evaluation  Patient identified by MRN, date of birth, ID band Patient awake    Reviewed: Allergy & Precautions, H&P , NPO status , Patient's Chart, lab work & pertinent test results  Airway Mallampati: III TM Distance: <3 FB Neck ROM: Full    Dental  (+) Caps and Dental Advisory Given   Pulmonary neg pulmonary ROS,  breath sounds clear to auscultation  Pulmonary exam normal       Cardiovascular negative cardio ROS  Rhythm:Regular Rate:Normal     Neuro/Psych Seizures -, Well Controlled,  negative psych ROS   GI/Hepatic negative GI ROS, Neg liver ROS,   Endo/Other  negative endocrine ROS  Renal/GU negative Renal ROS  negative genitourinary   Musculoskeletal negative musculoskeletal ROS (+)   Abdominal   Peds negative pediatric ROS (+)  Hematology negative hematology ROS (+)   Anesthesia Other Findings   Reproductive/Obstetrics negative OB ROS                          Anesthesia Physical Anesthesia Plan  ASA: II  Anesthesia Plan: General   Post-op Pain Management:    Induction: Intravenous  Airway Management Planned: Oral ETT  Additional Equipment:   Intra-op Plan:   Post-operative Plan: Extubation in OR  Informed Consent: I have reviewed the patients History and Physical, chart, labs and discussed the procedure including the risks, benefits and alternatives for the proposed anesthesia with the patient or authorized representative who has indicated his/her understanding and acceptance.   Dental advisory given  Plan Discussed with: CRNA and Surgeon  Anesthesia Plan Comments:         Anesthesia Quick Evaluation

## 2012-07-25 NOTE — H&P (View-Only) (Signed)
Patient ID: [name], male   DOB: December 31, 1940, 72 y.o.   MRN: [id number]  Chief Complaint  Patient presents with  . Abdominal Pain    Evaluate gallbladder    HPI [name] is a 72 y.o. male.  Who is referred by Dr. Dimas Aguas for evaluation secondary to right quadrant pain. Patient procedure ED secondary to midback pain was evaluated for possible cardiac event which is negative.  The patient is a CT scan which revealed no stones, and the patient was followed up by his PCP and underwent ultrasound which revealed thickening of the gallbladder. Patient also states that he had labs that showed symptoms of possible cholelithiasis, the laps were not sent did not have access to see.  The patient after pain had soreness in his right upper quadrant. He states this was his first occurrence. HPI  Past Medical History  Diagnosis Date  . Seizures     Past Surgical History  Procedure Date  . Tonsillectomy   . Elbow surgery     chipped bone right elbow  . Vasectomy   . Colonoscopy 11/15/2011    Procedure: COLONOSCOPY;  Surgeon: Malissa Hippo, MD;  Location: [facility];  Service: Endoscopy;  Laterality: N/A;  730    No family history on file.  Social History History  Substance Use Topics  . Smoking status: Never Smoker   . Smokeless tobacco: Not on file  . Alcohol Use: 0.6 oz/week    1 Glasses of wine per week     Comment: mixed drink  once in awhile    No Known Allergies  Current Outpatient Prescriptions  Medication Sig Dispense Refill  . aspirin 81 MG tablet Take 81 mg by mouth daily.      . fluticasone (FLONASE) 50 MCG/ACT nasal spray Place 2 sprays into the nose daily.      . multivitamin-iron-minerals-folic acid (CENTRUM) chewable tablet Chew 1 tablet by mouth daily.      . Oxcarbazepine (TRILEPTAL) 300 MG tablet Take 300 mg by mouth 2 (two) times daily.      Marland Kitchen zonisamide (ZONEGRAN) 100 MG capsule Take 400 mg by mouth daily.        Review of Systems Review of Systems    Constitutional: Negative.   HENT: Negative.   Respiratory: Negative.   Cardiovascular: Negative.   Gastrointestinal: Negative.   Neurological: Negative.     Blood pressure 118/82, pulse 66, temperature 98.3 F (36.8 C), temperature source Temporal, resp. rate 16, height 5\' 8"  (1.727 m), weight 178 lb 6.4 oz (80.922 kg).  Physical Exam Physical Exam  Constitutional: He is oriented to person, place, and time. He appears well-developed and well-nourished.  HENT:  Head: Normocephalic and atraumatic.  Eyes: Conjunctivae normal and EOM are normal. Pupils are equal, round, and reactive to light.  Neck: Normal range of motion. Neck supple.  Cardiovascular: Normal rate and normal heart sounds.   Pulmonary/Chest: Effort normal and breath sounds normal.  Abdominal: Soft. Bowel sounds are normal. There is no tenderness. There is no rebound.  Musculoskeletal: Normal range of motion.  Neurological: He is alert and oriented to person, place, and time.    Data Reviewed Ultrasound:revealed gallbladder wall thickening no stones or sludge per ultrasound.  Assessment    72 year old male with likely biliary dyskinesia versus symptomatic cholelithiasis which is consistent with his ultrasound.    Plan    1. After discussing options with the patient the patient would like to proceed preoperative for laparoscopic  cholecystectomy. I discussed with the patient that this may not resolve his right quadrant pain or pain he had to come to the ED. He voiced understanding and wished to proceed.  2.All risks and benefits were discussed with the patient, to generally include infection, bleeding, damage to surrounding structures, and recurrence. Alternatives were offered and described.  All questions were answered and the patient voiced understanding of the procedure and wishes to proceed at this point.        Marigene Ehlers., Kyndall Amero 07/12/2012, 2:28 PM

## 2012-07-26 ENCOUNTER — Encounter (HOSPITAL_COMMUNITY): Payer: Self-pay | Admitting: General Surgery

## 2012-07-26 ENCOUNTER — Telehealth (INDEPENDENT_AMBULATORY_CARE_PROVIDER_SITE_OTHER): Payer: Self-pay | Admitting: General Surgery

## 2012-07-26 NOTE — Telephone Encounter (Signed)
Wife called with post op question.  Navel today looks slightly red.  Dressing has been removed and pt has showered.  No pain and no fever at this time.  Wife will monitor this overnight and call back if worsens or if fever develops.

## 2012-07-28 ENCOUNTER — Telehealth (INDEPENDENT_AMBULATORY_CARE_PROVIDER_SITE_OTHER): Payer: Self-pay | Admitting: General Surgery

## 2012-07-28 NOTE — Telephone Encounter (Signed)
Pt had lap chole last week with dr. Derrell Lolling.  He was having temps of 100 and some redness of umbilical wound. Also had recent MRSA + result.  Called in bactrim DS and told to come to urgent office tomorrow.

## 2012-07-29 ENCOUNTER — Ambulatory Visit (INDEPENDENT_AMBULATORY_CARE_PROVIDER_SITE_OTHER): Payer: Medicare Other | Admitting: General Surgery

## 2012-07-29 ENCOUNTER — Encounter (INDEPENDENT_AMBULATORY_CARE_PROVIDER_SITE_OTHER): Payer: Self-pay | Admitting: General Surgery

## 2012-07-29 VITALS — BP 132/78 | HR 70 | Temp 98.0°F | Ht 68.0 in | Wt 173.6 lb

## 2012-07-29 DIAGNOSIS — T8149XA Infection following a procedure, other surgical site, initial encounter: Secondary | ICD-10-CM

## 2012-07-29 DIAGNOSIS — T8140XA Infection following a procedure, unspecified, initial encounter: Secondary | ICD-10-CM

## 2012-07-29 NOTE — Patient Instructions (Addendum)
Observe the area of redness and call if it is enlarging or more red or if there is fever or other concerns. Take antibiotics until they are gone. May shower.

## 2012-07-29 NOTE — Progress Notes (Signed)
Chief complaint: Redness of umbilical incision post cholecystectomy  History:  Patient is 4 days following laparoscopic cholecystectomy for symptomatic gallstones. The first postoperative day the patient and his wife noticed an area of significant redness around the patient's umbilical incision. They called the physician on call and he was started on oral Bactrim. The following day, Saturday, was improved. Yesterday the area looked worse again but today they state it is definitely improved with less redness. He had fever to 100 yesterday.  He has some soreness around the umbilicus but no generalized abdominal pain or other worrisome symptoms.  Exam: BP 132/78  Pulse 70  Temp 98 F (36.7 C) (Temporal)  Ht 5\' 8"  (1.727 m)  Wt 173 lb 9.6 oz (78.744 kg)  BMI 26.40 kg/m2  SpO2 97% General: Does not appear ill Abdomen: There is an area of mild to moderate erythema extending about 10 cm out in all directions( 20 cm total diameter) away from his umbilical incision. There is mild induration of the incision but no fluctuance or drainage.  Assessment and plan: Wound cellulitis post cholecystectomy. The patient and his family status definitely looks and feels better today. We will therefore continue the oral Bactrim and plan close follow up later this week. They understand a call immediately if the area of redness gets any larger or more intense or if he has fever or other worrisome symptoms.

## 2012-08-01 ENCOUNTER — Encounter (INDEPENDENT_AMBULATORY_CARE_PROVIDER_SITE_OTHER): Payer: Medicare Other | Admitting: General Surgery

## 2012-08-02 ENCOUNTER — Encounter (INDEPENDENT_AMBULATORY_CARE_PROVIDER_SITE_OTHER): Payer: Self-pay | Admitting: General Surgery

## 2012-08-02 ENCOUNTER — Ambulatory Visit (INDEPENDENT_AMBULATORY_CARE_PROVIDER_SITE_OTHER): Payer: Medicare Other | Admitting: General Surgery

## 2012-08-02 VITALS — BP 140/68 | HR 68 | Temp 99.8°F | Resp 18 | Ht 68.0 in | Wt 172.4 lb

## 2012-08-02 DIAGNOSIS — L0390 Cellulitis, unspecified: Secondary | ICD-10-CM

## 2012-08-02 DIAGNOSIS — Z9049 Acquired absence of other specified parts of digestive tract: Secondary | ICD-10-CM

## 2012-08-02 DIAGNOSIS — L0291 Cutaneous abscess, unspecified: Secondary | ICD-10-CM

## 2012-08-02 DIAGNOSIS — Z9889 Other specified postprocedural states: Secondary | ICD-10-CM

## 2012-08-02 MED ORDER — SULFAMETHOXAZOLE-TRIMETHOPRIM 800-160 MG PO TABS: 1.0000 | ORAL_TABLET | Freq: Two times a day (BID) | ORAL | Status: AC

## 2012-08-02 NOTE — Progress Notes (Signed)
Patient ID: [name], male   DOB: Jan 21, 1941, 72 y.o.   MRN: [id number] Patient is a 72 year old male status post laparoscopic cholecystectomy.  Patient is superficial periumbilical cellulitis and treated with Bactrim. This is improved since that time. The area of erythema has decreased to be just around the umbilicus. He complains of no pain or drainage area. His appetite slowly returned normal. His previous pain in her quadrant has since resolved.  On exam: His clean dry and intact, his perineal wound has a small area of erythema surrounding the inferior edge.  Assessment and plan: I Will extend his antibiotics for 3 more days for a total of 10 days.  Patient's followup appointment on January 16 which she will keep to reevaluate his cellulitis.

## 2012-08-05 ENCOUNTER — Other Ambulatory Visit (INDEPENDENT_AMBULATORY_CARE_PROVIDER_SITE_OTHER): Payer: Self-pay | Admitting: General Surgery

## 2012-08-05 ENCOUNTER — Telehealth (INDEPENDENT_AMBULATORY_CARE_PROVIDER_SITE_OTHER): Payer: Self-pay | Admitting: General Surgery

## 2012-08-05 MED ORDER — METHYLPREDNISOLONE (PAK) 4 MG PO TABS: 4.0000 mg | ORAL_TABLET | Freq: Every day | ORAL | Status: AC

## 2012-08-05 NOTE — Telephone Encounter (Signed)
Dr Derrell Lolling stated the pt didn't need to be on anymore antibiotics.  He also ordered a steroid dose pack to go to his pharmacy for him to take.  I notified the wife.

## 2012-08-05 NOTE — Telephone Encounter (Signed)
Patient spouse, Gigi Gin called in stating that her husband had surgery on 07/25/12 and developed a rash from the antibiotic issued. Patient was taking it for 7 days and on Saturday when she called in was told to take him off the medication. She stated the rash has gone down some, but still visible. Also, the patient has been running a fever off and on. Saturday it was 101, no fever on Sunday until that evening before going to bed it was 100.5. Patient had chills and presents with no fever today. I advised I would page Dr. Derrell Lolling to consult and find out if the patient can take topical benadryl due to his allergy to antihistamine. She agreed to call back and confirmed number we have is correct.

## 2012-08-08 ENCOUNTER — Encounter (INDEPENDENT_AMBULATORY_CARE_PROVIDER_SITE_OTHER): Payer: Self-pay | Admitting: General Surgery

## 2012-08-08 ENCOUNTER — Ambulatory Visit (INDEPENDENT_AMBULATORY_CARE_PROVIDER_SITE_OTHER): Payer: Medicare Other | Admitting: General Surgery

## 2012-08-08 VITALS — BP 130/76 | HR 70 | Temp 97.5°F | Resp 12 | Ht 68.0 in | Wt 173.6 lb

## 2012-08-08 DIAGNOSIS — T8140XA Infection following a procedure, unspecified, initial encounter: Secondary | ICD-10-CM

## 2012-08-08 DIAGNOSIS — Z9049 Acquired absence of other specified parts of digestive tract: Secondary | ICD-10-CM

## 2012-08-08 DIAGNOSIS — Z9089 Acquired absence of other organs: Secondary | ICD-10-CM

## 2012-08-08 NOTE — Progress Notes (Signed)
Patient ID: [name], male   DOB: [birth year], 72 y.o.   MRN: [id number] The patient is a 72 year old illness status post cholecystectomy. Patient had an umbilical infection and treated with Bactrim. Patient subsequently has had it allergic reaction likely to sulfa. Patient has had previous contraindications to antihistamine and was started on steroid treatment. Patient currently has approximately 2 days left of treatment his rash is better.  On exam: His umbilical wound is nonerythematous no drainage She has a resolving rash in his anterior abdominal wall chest wall.  Assessment and plan: Return to clinic in one month The patient can just call if there is any other complications .

## 2012-08-14 DIAGNOSIS — G40909 Epilepsy, unspecified, not intractable, without status epilepticus: Secondary | ICD-10-CM | POA: Diagnosis not present

## 2012-08-14 DIAGNOSIS — Z79899 Other long term (current) drug therapy: Secondary | ICD-10-CM | POA: Diagnosis not present

## 2012-08-14 DIAGNOSIS — I803 Phlebitis and thrombophlebitis of lower extremities, unspecified: Secondary | ICD-10-CM | POA: Diagnosis not present

## 2012-08-14 DIAGNOSIS — E785 Hyperlipidemia, unspecified: Secondary | ICD-10-CM | POA: Diagnosis not present

## 2012-08-14 DIAGNOSIS — Z0000 Encounter for general adult medical examination without abnormal findings: Secondary | ICD-10-CM | POA: Diagnosis not present

## 2012-08-14 DIAGNOSIS — L719 Rosacea, unspecified: Secondary | ICD-10-CM | POA: Diagnosis not present

## 2012-09-12 ENCOUNTER — Encounter (INDEPENDENT_AMBULATORY_CARE_PROVIDER_SITE_OTHER): Payer: Self-pay | Admitting: General Surgery

## 2012-09-12 ENCOUNTER — Ambulatory Visit (INDEPENDENT_AMBULATORY_CARE_PROVIDER_SITE_OTHER): Payer: Medicare Other | Admitting: General Surgery

## 2012-09-12 VITALS — BP 124/83 | HR 70 | Temp 97.2°F | Resp 14 | Ht 67.5 in | Wt 179.6 lb

## 2012-09-12 DIAGNOSIS — Z9049 Acquired absence of other specified parts of digestive tract: Secondary | ICD-10-CM | POA: Insufficient documentation

## 2012-09-12 NOTE — Progress Notes (Signed)
Patient ID: [name], male   DOB: [birth year], 72 y.o.   MRN: [id number] The patient is a 72 year old male status post laparoscopic cholecystectomy. The patient has done well postoperatively. He continues E. Normal diet and has normal bowel function.  On exam: Wounds are clean dry and intact  Pathology: Chronic cholecystitis and cholelithiasis  Assessment & clinical:  72 year old male status post laparoscopic cholecystectomy. 1.the patient follow up when necessary

## 2012-12-12 DIAGNOSIS — N200 Calculus of kidney: Secondary | ICD-10-CM | POA: Diagnosis not present

## 2012-12-12 DIAGNOSIS — R3915 Urgency of urination: Secondary | ICD-10-CM | POA: Diagnosis not present

## 2012-12-12 DIAGNOSIS — R39198 Other difficulties with micturition: Secondary | ICD-10-CM | POA: Diagnosis not present

## 2012-12-12 DIAGNOSIS — R310 Gross hematuria: Secondary | ICD-10-CM | POA: Diagnosis not present

## 2012-12-17 DIAGNOSIS — R42 Dizziness and giddiness: Secondary | ICD-10-CM | POA: Diagnosis not present

## 2012-12-18 DIAGNOSIS — R3915 Urgency of urination: Secondary | ICD-10-CM | POA: Diagnosis not present

## 2012-12-18 DIAGNOSIS — R310 Gross hematuria: Secondary | ICD-10-CM | POA: Diagnosis not present

## 2012-12-18 DIAGNOSIS — Z125 Encounter for screening for malignant neoplasm of prostate: Secondary | ICD-10-CM | POA: Diagnosis not present

## 2012-12-19 DIAGNOSIS — N200 Calculus of kidney: Secondary | ICD-10-CM | POA: Diagnosis not present

## 2012-12-19 DIAGNOSIS — R310 Gross hematuria: Secondary | ICD-10-CM | POA: Diagnosis not present

## 2012-12-19 DIAGNOSIS — N201 Calculus of ureter: Secondary | ICD-10-CM | POA: Diagnosis not present

## 2013-01-07 DIAGNOSIS — R3915 Urgency of urination: Secondary | ICD-10-CM | POA: Diagnosis not present

## 2013-01-07 DIAGNOSIS — R39198 Other difficulties with micturition: Secondary | ICD-10-CM | POA: Diagnosis not present

## 2013-01-07 DIAGNOSIS — N200 Calculus of kidney: Secondary | ICD-10-CM | POA: Diagnosis not present

## 2013-01-07 DIAGNOSIS — R310 Gross hematuria: Secondary | ICD-10-CM | POA: Diagnosis not present

## 2013-05-05 DIAGNOSIS — G40109 Localization-related (focal) (partial) symptomatic epilepsy and epileptic syndromes with simple partial seizures, not intractable, without status epilepticus: Secondary | ICD-10-CM | POA: Diagnosis not present

## 2013-05-05 DIAGNOSIS — Z79899 Other long term (current) drug therapy: Secondary | ICD-10-CM | POA: Diagnosis not present

## 2013-06-24 DIAGNOSIS — L719 Rosacea, unspecified: Secondary | ICD-10-CM | POA: Diagnosis not present

## 2013-06-24 DIAGNOSIS — L570 Actinic keratosis: Secondary | ICD-10-CM | POA: Diagnosis not present

## 2013-06-24 DIAGNOSIS — L738 Other specified follicular disorders: Secondary | ICD-10-CM | POA: Diagnosis not present

## 2013-06-24 DIAGNOSIS — D485 Neoplasm of uncertain behavior of skin: Secondary | ICD-10-CM | POA: Diagnosis not present

## 2013-07-03 DIAGNOSIS — D485 Neoplasm of uncertain behavior of skin: Secondary | ICD-10-CM | POA: Diagnosis not present

## 2013-07-09 DIAGNOSIS — R310 Gross hematuria: Secondary | ICD-10-CM | POA: Diagnosis not present

## 2013-07-09 DIAGNOSIS — R39198 Other difficulties with micturition: Secondary | ICD-10-CM | POA: Diagnosis not present

## 2013-07-09 DIAGNOSIS — R3915 Urgency of urination: Secondary | ICD-10-CM | POA: Diagnosis not present

## 2013-07-09 DIAGNOSIS — N200 Calculus of kidney: Secondary | ICD-10-CM | POA: Diagnosis not present

## 2013-07-23 DIAGNOSIS — Z23 Encounter for immunization: Secondary | ICD-10-CM | POA: Diagnosis not present

## 2013-08-18 DIAGNOSIS — L719 Rosacea, unspecified: Secondary | ICD-10-CM | POA: Insufficient documentation

## 2013-08-18 DIAGNOSIS — N200 Calculus of kidney: Secondary | ICD-10-CM | POA: Diagnosis not present

## 2013-08-18 DIAGNOSIS — Z1331 Encounter for screening for depression: Secondary | ICD-10-CM | POA: Diagnosis not present

## 2013-08-18 DIAGNOSIS — G40909 Epilepsy, unspecified, not intractable, without status epilepticus: Secondary | ICD-10-CM | POA: Diagnosis not present

## 2013-08-18 DIAGNOSIS — G40309 Generalized idiopathic epilepsy and epileptic syndromes, not intractable, without status epilepticus: Secondary | ICD-10-CM | POA: Insufficient documentation

## 2013-08-18 DIAGNOSIS — N400 Enlarged prostate without lower urinary tract symptoms: Secondary | ICD-10-CM | POA: Diagnosis not present

## 2013-08-18 DIAGNOSIS — E7800 Pure hypercholesterolemia, unspecified: Secondary | ICD-10-CM | POA: Diagnosis not present

## 2013-08-18 DIAGNOSIS — L570 Actinic keratosis: Secondary | ICD-10-CM | POA: Diagnosis not present

## 2013-08-18 DIAGNOSIS — R42 Dizziness and giddiness: Secondary | ICD-10-CM | POA: Diagnosis not present

## 2013-08-18 DIAGNOSIS — Z0000 Encounter for general adult medical examination without abnormal findings: Secondary | ICD-10-CM | POA: Diagnosis not present

## 2013-08-18 DIAGNOSIS — Z23 Encounter for immunization: Secondary | ICD-10-CM | POA: Diagnosis not present

## 2013-11-24 DIAGNOSIS — H52229 Regular astigmatism, unspecified eye: Secondary | ICD-10-CM | POA: Diagnosis not present

## 2013-11-24 DIAGNOSIS — H2510 Age-related nuclear cataract, unspecified eye: Secondary | ICD-10-CM | POA: Diagnosis not present

## 2013-11-24 DIAGNOSIS — H25019 Cortical age-related cataract, unspecified eye: Secondary | ICD-10-CM | POA: Diagnosis not present

## 2013-11-24 DIAGNOSIS — H5200 Hypermetropia, unspecified eye: Secondary | ICD-10-CM | POA: Diagnosis not present

## 2014-01-06 DIAGNOSIS — R39198 Other difficulties with micturition: Secondary | ICD-10-CM | POA: Diagnosis not present

## 2014-01-06 DIAGNOSIS — R310 Gross hematuria: Secondary | ICD-10-CM | POA: Diagnosis not present

## 2014-01-06 DIAGNOSIS — R3915 Urgency of urination: Secondary | ICD-10-CM | POA: Diagnosis not present

## 2014-06-24 DIAGNOSIS — L570 Actinic keratosis: Secondary | ICD-10-CM | POA: Diagnosis not present

## 2014-06-24 DIAGNOSIS — D225 Melanocytic nevi of trunk: Secondary | ICD-10-CM | POA: Diagnosis not present

## 2014-06-24 DIAGNOSIS — D235 Other benign neoplasm of skin of trunk: Secondary | ICD-10-CM | POA: Diagnosis not present

## 2014-06-24 DIAGNOSIS — D485 Neoplasm of uncertain behavior of skin: Secondary | ICD-10-CM | POA: Diagnosis not present

## 2014-06-24 DIAGNOSIS — L820 Inflamed seborrheic keratosis: Secondary | ICD-10-CM | POA: Diagnosis not present

## 2014-07-07 DIAGNOSIS — N200 Calculus of kidney: Secondary | ICD-10-CM | POA: Diagnosis not present

## 2014-07-07 DIAGNOSIS — R3915 Urgency of urination: Secondary | ICD-10-CM | POA: Diagnosis not present

## 2014-07-07 DIAGNOSIS — R3919 Other difficulties with micturition: Secondary | ICD-10-CM | POA: Diagnosis not present

## 2014-07-07 DIAGNOSIS — R319 Hematuria, unspecified: Secondary | ICD-10-CM | POA: Diagnosis not present

## 2014-07-14 IMAGING — RF DG CHOLANGIOGRAM OPERATIVE
1 series · 4 of 4 positions shown · non-contrast
Comparison: None.

CLINICAL DATA: Biliary dyskinesia.

INTRAOPERATIVE CHOLANGIOGRAM
TECHNIQUE: Cholangiographic images from the C-arm fluoroscopic
device were submitted for interpretation post-operatively.  Please
see the procedural report for the amount of contrast and the
fluoroscopy time utilized.

[Series 1: run · 4 of 54 frames shown]
[frame 9/54]
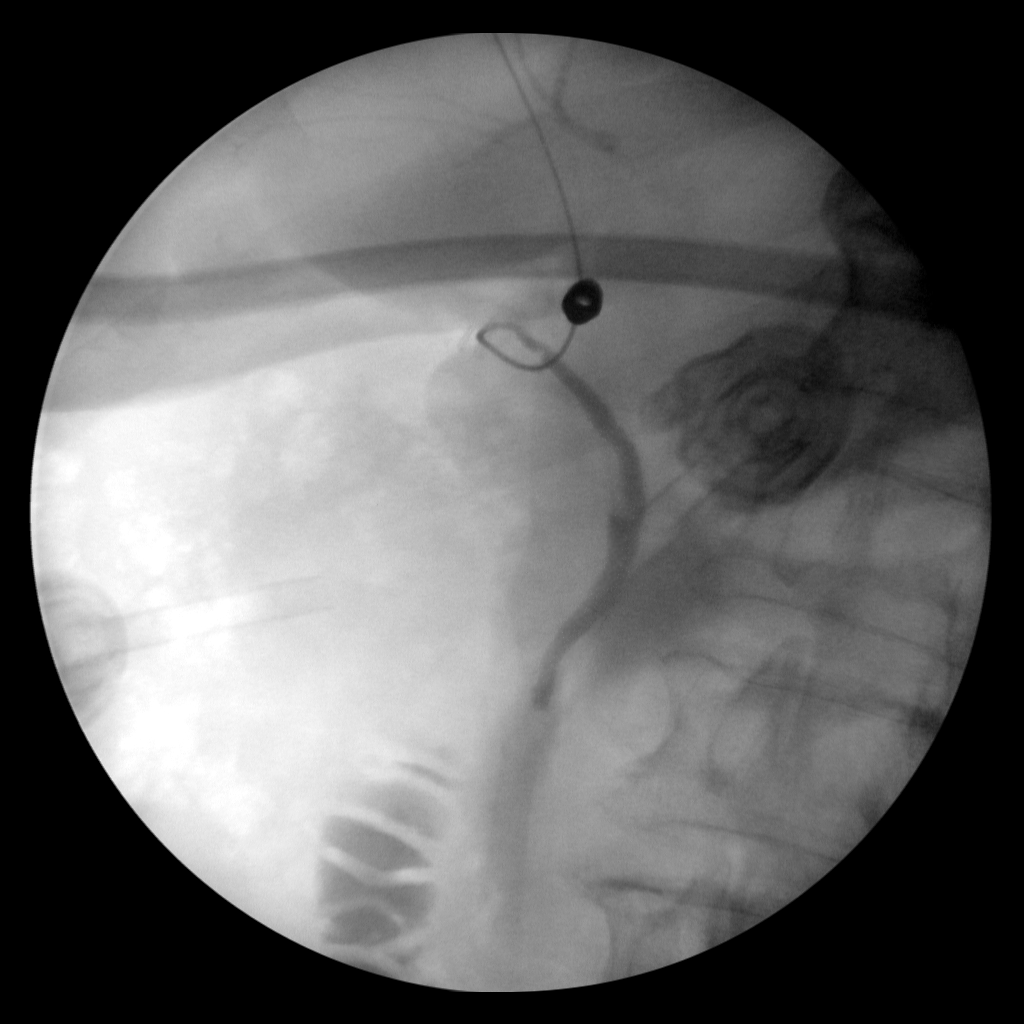
[frame 28/54]
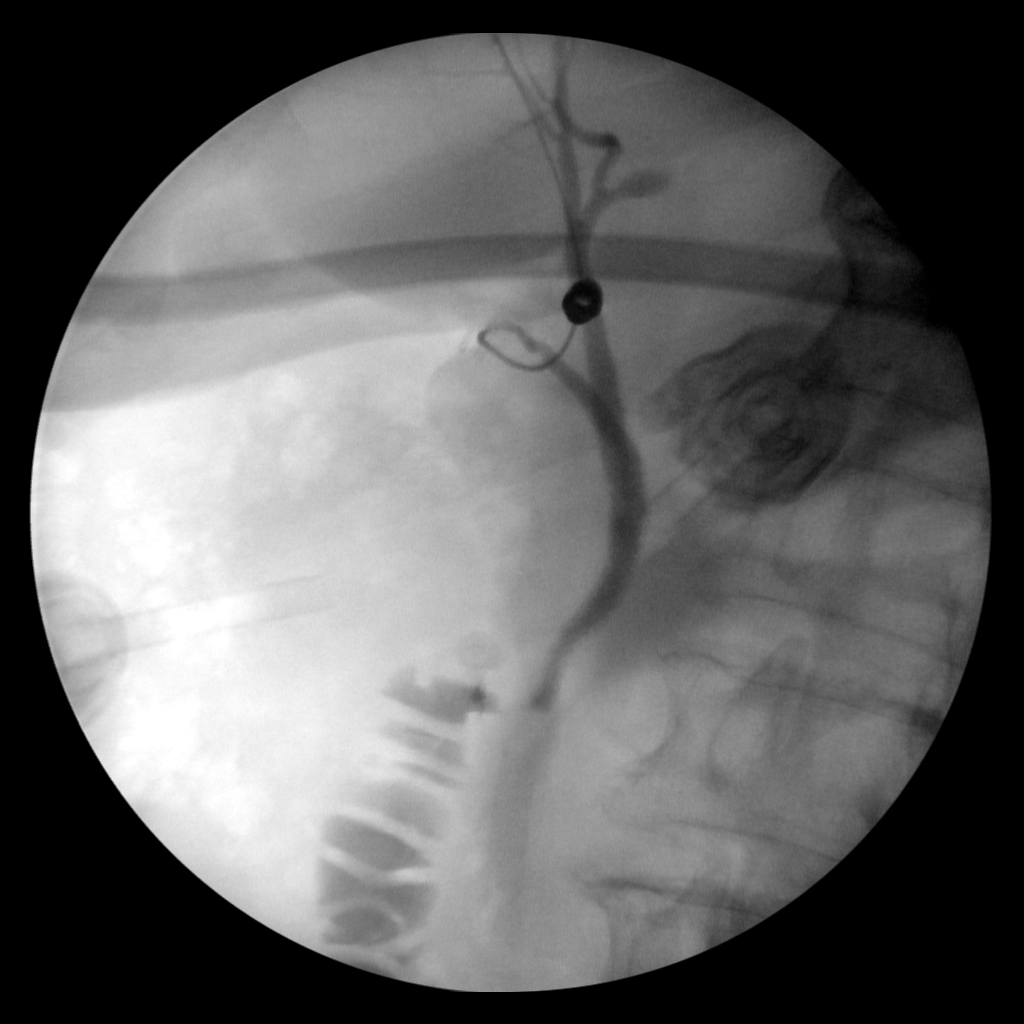
[frame 46/54]
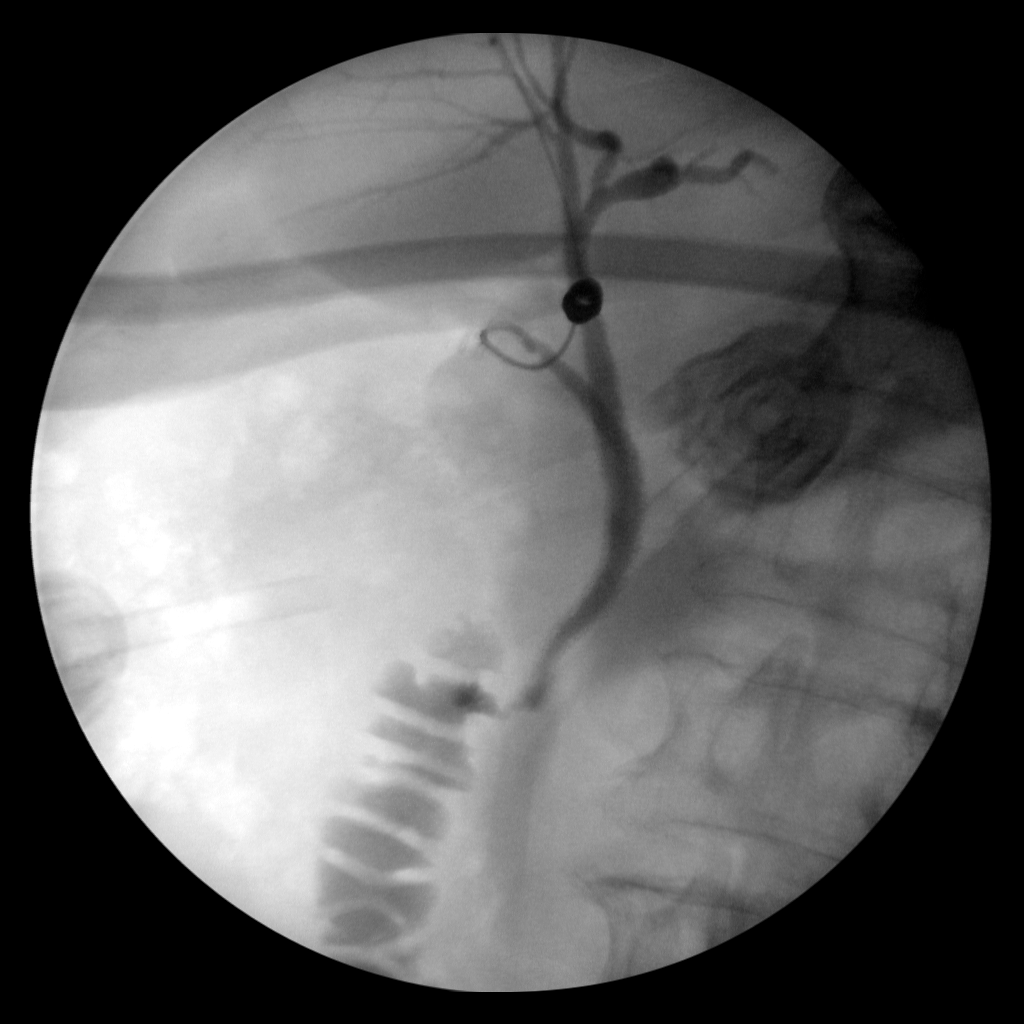
[frame 51/54]
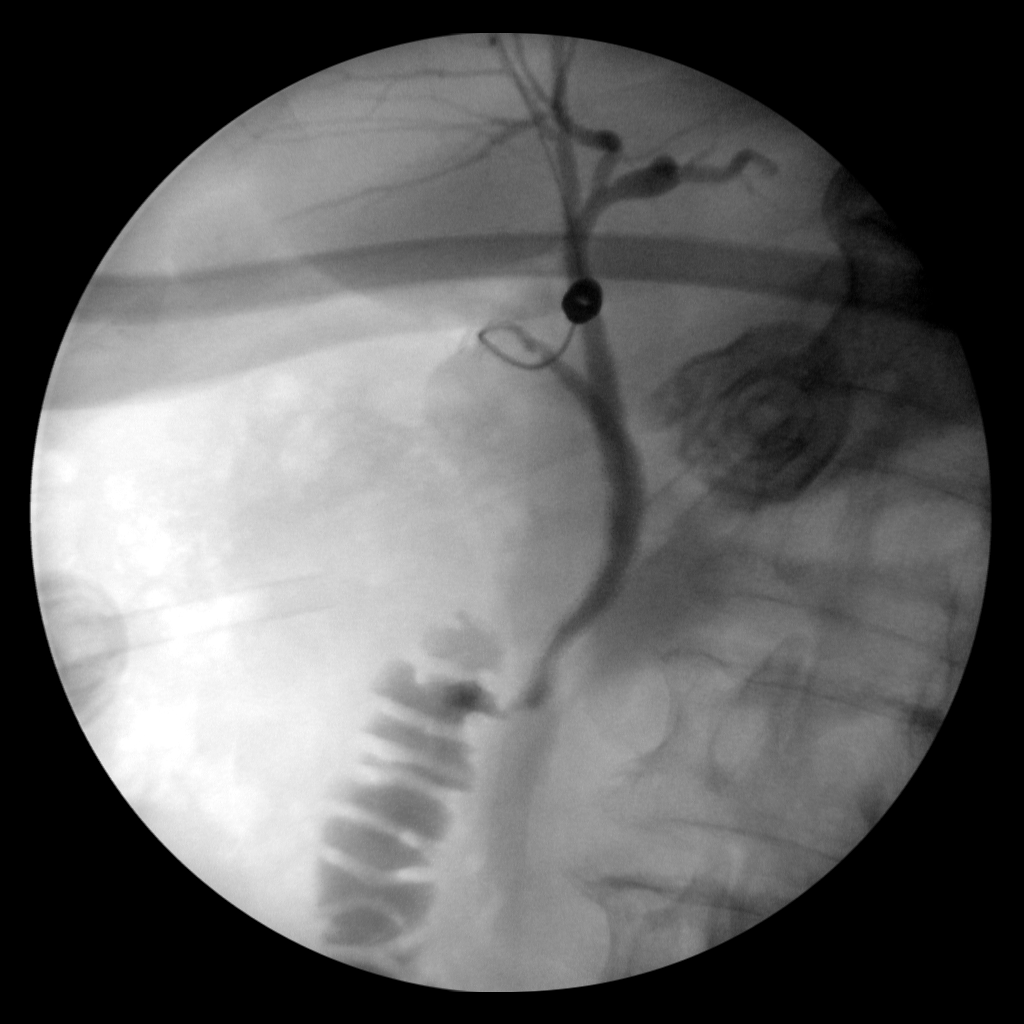

[4 of 4 positions shown; findings below may reference images not displayed]

FINDINGS: The gallbladder has been removed and the cystic duct
cannulated.  There is good opacification of the common bile duct,
common hepatic duct, and intrahepatic ducts.  No filling defects
are seen.  There is prompt passage of contrast into the duodenum.
IMPRESSION: Negative operative cholangiogram.

## 2014-08-05 DIAGNOSIS — Z23 Encounter for immunization: Secondary | ICD-10-CM | POA: Diagnosis not present

## 2014-08-19 DIAGNOSIS — N320 Bladder-neck obstruction: Secondary | ICD-10-CM | POA: Diagnosis not present

## 2014-08-19 DIAGNOSIS — Z1389 Encounter for screening for other disorder: Secondary | ICD-10-CM | POA: Diagnosis not present

## 2014-08-19 DIAGNOSIS — G40309 Generalized idiopathic epilepsy and epileptic syndromes, not intractable, without status epilepticus: Secondary | ICD-10-CM | POA: Diagnosis not present

## 2014-08-19 DIAGNOSIS — Z0000 Encounter for general adult medical examination without abnormal findings: Secondary | ICD-10-CM | POA: Diagnosis not present

## 2014-08-19 DIAGNOSIS — R5382 Chronic fatigue, unspecified: Secondary | ICD-10-CM | POA: Diagnosis not present

## 2014-08-19 DIAGNOSIS — L719 Rosacea, unspecified: Secondary | ICD-10-CM | POA: Diagnosis not present

## 2014-08-19 DIAGNOSIS — M5431 Sciatica, right side: Secondary | ICD-10-CM | POA: Diagnosis not present

## 2014-08-19 DIAGNOSIS — E780 Pure hypercholesterolemia: Secondary | ICD-10-CM | POA: Diagnosis not present

## 2014-08-21 DIAGNOSIS — M5441 Lumbago with sciatica, right side: Secondary | ICD-10-CM | POA: Diagnosis not present

## 2014-08-21 DIAGNOSIS — M6281 Muscle weakness (generalized): Secondary | ICD-10-CM | POA: Diagnosis not present

## 2014-08-21 DIAGNOSIS — M79604 Pain in right leg: Secondary | ICD-10-CM | POA: Diagnosis not present

## 2014-08-26 DIAGNOSIS — M6281 Muscle weakness (generalized): Secondary | ICD-10-CM | POA: Diagnosis not present

## 2014-08-26 DIAGNOSIS — M5441 Lumbago with sciatica, right side: Secondary | ICD-10-CM | POA: Diagnosis not present

## 2014-08-28 DIAGNOSIS — M5441 Lumbago with sciatica, right side: Secondary | ICD-10-CM | POA: Diagnosis not present

## 2014-08-28 DIAGNOSIS — M6281 Muscle weakness (generalized): Secondary | ICD-10-CM | POA: Diagnosis not present

## 2014-08-31 DIAGNOSIS — M6281 Muscle weakness (generalized): Secondary | ICD-10-CM | POA: Diagnosis not present

## 2014-08-31 DIAGNOSIS — M5441 Lumbago with sciatica, right side: Secondary | ICD-10-CM | POA: Diagnosis not present

## 2014-09-03 DIAGNOSIS — M5441 Lumbago with sciatica, right side: Secondary | ICD-10-CM | POA: Diagnosis not present

## 2014-09-03 DIAGNOSIS — M6281 Muscle weakness (generalized): Secondary | ICD-10-CM | POA: Diagnosis not present

## 2014-09-08 DIAGNOSIS — M5441 Lumbago with sciatica, right side: Secondary | ICD-10-CM | POA: Diagnosis not present

## 2014-09-08 DIAGNOSIS — M6281 Muscle weakness (generalized): Secondary | ICD-10-CM | POA: Diagnosis not present

## 2014-09-10 DIAGNOSIS — M5441 Lumbago with sciatica, right side: Secondary | ICD-10-CM | POA: Diagnosis not present

## 2014-09-10 DIAGNOSIS — M6281 Muscle weakness (generalized): Secondary | ICD-10-CM | POA: Diagnosis not present

## 2014-09-23 DIAGNOSIS — M5441 Lumbago with sciatica, right side: Secondary | ICD-10-CM | POA: Diagnosis not present

## 2014-09-23 DIAGNOSIS — M6281 Muscle weakness (generalized): Secondary | ICD-10-CM | POA: Diagnosis not present

## 2014-09-25 DIAGNOSIS — M6281 Muscle weakness (generalized): Secondary | ICD-10-CM | POA: Diagnosis not present

## 2014-09-25 DIAGNOSIS — M5441 Lumbago with sciatica, right side: Secondary | ICD-10-CM | POA: Diagnosis not present

## 2014-09-29 DIAGNOSIS — M5441 Lumbago with sciatica, right side: Secondary | ICD-10-CM | POA: Diagnosis not present

## 2014-09-29 DIAGNOSIS — M6281 Muscle weakness (generalized): Secondary | ICD-10-CM | POA: Diagnosis not present

## 2014-10-01 DIAGNOSIS — M6281 Muscle weakness (generalized): Secondary | ICD-10-CM | POA: Diagnosis not present

## 2014-10-01 DIAGNOSIS — M5441 Lumbago with sciatica, right side: Secondary | ICD-10-CM | POA: Diagnosis not present

## 2014-10-06 DIAGNOSIS — M6281 Muscle weakness (generalized): Secondary | ICD-10-CM | POA: Diagnosis not present

## 2014-10-06 DIAGNOSIS — M5441 Lumbago with sciatica, right side: Secondary | ICD-10-CM | POA: Diagnosis not present

## 2014-10-08 DIAGNOSIS — M5441 Lumbago with sciatica, right side: Secondary | ICD-10-CM | POA: Diagnosis not present

## 2014-10-08 DIAGNOSIS — M6281 Muscle weakness (generalized): Secondary | ICD-10-CM | POA: Diagnosis not present

## 2014-11-24 DIAGNOSIS — R5383 Other fatigue: Secondary | ICD-10-CM | POA: Diagnosis not present

## 2014-11-24 DIAGNOSIS — G40309 Generalized idiopathic epilepsy and epileptic syndromes, not intractable, without status epilepticus: Secondary | ICD-10-CM | POA: Diagnosis not present

## 2015-01-19 DIAGNOSIS — H524 Presbyopia: Secondary | ICD-10-CM | POA: Diagnosis not present

## 2015-01-19 DIAGNOSIS — H25813 Combined forms of age-related cataract, bilateral: Secondary | ICD-10-CM | POA: Diagnosis not present

## 2015-01-19 DIAGNOSIS — H52223 Regular astigmatism, bilateral: Secondary | ICD-10-CM | POA: Diagnosis not present

## 2015-01-19 DIAGNOSIS — H5203 Hypermetropia, bilateral: Secondary | ICD-10-CM | POA: Diagnosis not present

## 2015-03-01 DIAGNOSIS — E039 Hypothyroidism, unspecified: Secondary | ICD-10-CM | POA: Diagnosis not present

## 2015-03-01 DIAGNOSIS — G40309 Generalized idiopathic epilepsy and epileptic syndromes, not intractable, without status epilepticus: Secondary | ICD-10-CM | POA: Diagnosis not present

## 2015-03-08 DIAGNOSIS — E039 Hypothyroidism, unspecified: Secondary | ICD-10-CM | POA: Diagnosis not present

## 2015-03-08 DIAGNOSIS — G40309 Generalized idiopathic epilepsy and epileptic syndromes, not intractable, without status epilepticus: Secondary | ICD-10-CM | POA: Diagnosis not present

## 2015-04-15 DIAGNOSIS — G40109 Localization-related (focal) (partial) symptomatic epilepsy and epileptic syndromes with simple partial seizures, not intractable, without status epilepticus: Secondary | ICD-10-CM | POA: Diagnosis not present

## 2015-04-15 DIAGNOSIS — Z5181 Encounter for therapeutic drug level monitoring: Secondary | ICD-10-CM | POA: Diagnosis not present

## 2015-04-22 DIAGNOSIS — B029 Zoster without complications: Secondary | ICD-10-CM | POA: Diagnosis not present

## 2015-06-28 DIAGNOSIS — L710 Perioral dermatitis: Secondary | ICD-10-CM | POA: Diagnosis not present

## 2015-06-28 DIAGNOSIS — L570 Actinic keratosis: Secondary | ICD-10-CM | POA: Diagnosis not present

## 2015-07-08 DIAGNOSIS — R3915 Urgency of urination: Secondary | ICD-10-CM | POA: Diagnosis not present

## 2015-08-20 DIAGNOSIS — L719 Rosacea, unspecified: Secondary | ICD-10-CM | POA: Diagnosis not present

## 2015-08-20 DIAGNOSIS — G40309 Generalized idiopathic epilepsy and epileptic syndromes, not intractable, without status epilepticus: Secondary | ICD-10-CM | POA: Diagnosis not present

## 2015-08-20 DIAGNOSIS — Z125 Encounter for screening for malignant neoplasm of prostate: Secondary | ICD-10-CM | POA: Diagnosis not present

## 2015-08-20 DIAGNOSIS — M5431 Sciatica, right side: Secondary | ICD-10-CM | POA: Diagnosis not present

## 2015-08-20 DIAGNOSIS — E039 Hypothyroidism, unspecified: Secondary | ICD-10-CM | POA: Diagnosis not present

## 2015-08-27 DIAGNOSIS — N200 Calculus of kidney: Secondary | ICD-10-CM | POA: Diagnosis not present

## 2015-08-27 DIAGNOSIS — E039 Hypothyroidism, unspecified: Secondary | ICD-10-CM | POA: Diagnosis not present

## 2015-08-27 DIAGNOSIS — G40309 Generalized idiopathic epilepsy and epileptic syndromes, not intractable, without status epilepticus: Secondary | ICD-10-CM | POA: Diagnosis not present

## 2015-08-27 DIAGNOSIS — Z0001 Encounter for general adult medical examination with abnormal findings: Secondary | ICD-10-CM | POA: Diagnosis not present

## 2015-08-27 DIAGNOSIS — L719 Rosacea, unspecified: Secondary | ICD-10-CM | POA: Diagnosis not present

## 2015-09-06 DIAGNOSIS — Z23 Encounter for immunization: Secondary | ICD-10-CM | POA: Diagnosis not present

## 2015-12-27 DIAGNOSIS — E039 Hypothyroidism, unspecified: Secondary | ICD-10-CM | POA: Diagnosis not present

## 2016-03-28 ENCOUNTER — Other Ambulatory Visit: Payer: Self-pay

## 2016-06-26 DIAGNOSIS — L309 Dermatitis, unspecified: Secondary | ICD-10-CM | POA: Diagnosis not present

## 2016-06-26 DIAGNOSIS — L570 Actinic keratosis: Secondary | ICD-10-CM | POA: Diagnosis not present

## 2016-07-06 DIAGNOSIS — R319 Hematuria, unspecified: Secondary | ICD-10-CM | POA: Diagnosis not present

## 2016-08-16 DIAGNOSIS — Z23 Encounter for immunization: Secondary | ICD-10-CM | POA: Diagnosis not present

## 2016-08-24 DIAGNOSIS — E039 Hypothyroidism, unspecified: Secondary | ICD-10-CM | POA: Diagnosis not present

## 2016-08-24 DIAGNOSIS — N400 Enlarged prostate without lower urinary tract symptoms: Secondary | ICD-10-CM | POA: Diagnosis not present

## 2016-08-24 DIAGNOSIS — G40309 Generalized idiopathic epilepsy and epileptic syndromes, not intractable, without status epilepticus: Secondary | ICD-10-CM | POA: Diagnosis not present

## 2016-08-24 DIAGNOSIS — L719 Rosacea, unspecified: Secondary | ICD-10-CM | POA: Diagnosis not present

## 2016-08-24 DIAGNOSIS — M5431 Sciatica, right side: Secondary | ICD-10-CM | POA: Diagnosis not present

## 2016-08-30 DIAGNOSIS — Z6828 Body mass index (BMI) 28.0-28.9, adult: Secondary | ICD-10-CM | POA: Diagnosis not present

## 2016-08-30 DIAGNOSIS — L719 Rosacea, unspecified: Secondary | ICD-10-CM | POA: Diagnosis not present

## 2016-08-30 DIAGNOSIS — E039 Hypothyroidism, unspecified: Secondary | ICD-10-CM | POA: Diagnosis not present

## 2016-08-30 DIAGNOSIS — Z23 Encounter for immunization: Secondary | ICD-10-CM | POA: Diagnosis not present

## 2016-08-30 DIAGNOSIS — G40309 Generalized idiopathic epilepsy and epileptic syndromes, not intractable, without status epilepticus: Secondary | ICD-10-CM | POA: Diagnosis not present

## 2016-08-30 DIAGNOSIS — Z0001 Encounter for general adult medical examination with abnormal findings: Secondary | ICD-10-CM | POA: Diagnosis not present

## 2016-11-03 ENCOUNTER — Encounter (INDEPENDENT_AMBULATORY_CARE_PROVIDER_SITE_OTHER): Payer: Self-pay | Admitting: *Deleted

## 2016-11-03 DIAGNOSIS — E039 Hypothyroidism, unspecified: Secondary | ICD-10-CM | POA: Diagnosis not present

## 2016-11-06 DIAGNOSIS — Z6828 Body mass index (BMI) 28.0-28.9, adult: Secondary | ICD-10-CM | POA: Diagnosis not present

## 2016-11-06 DIAGNOSIS — E039 Hypothyroidism, unspecified: Secondary | ICD-10-CM | POA: Diagnosis not present

## 2017-02-09 DIAGNOSIS — H0230 Blepharochalasis unspecified eye, unspecified eyelid: Secondary | ICD-10-CM | POA: Diagnosis not present

## 2017-02-09 DIAGNOSIS — H5203 Hypermetropia, bilateral: Secondary | ICD-10-CM | POA: Diagnosis not present

## 2017-02-09 DIAGNOSIS — H25813 Combined forms of age-related cataract, bilateral: Secondary | ICD-10-CM | POA: Diagnosis not present

## 2017-02-09 DIAGNOSIS — H52223 Regular astigmatism, bilateral: Secondary | ICD-10-CM | POA: Diagnosis not present

## 2017-02-24 DIAGNOSIS — N410 Acute prostatitis: Secondary | ICD-10-CM | POA: Diagnosis not present

## 2017-02-24 DIAGNOSIS — Z6828 Body mass index (BMI) 28.0-28.9, adult: Secondary | ICD-10-CM | POA: Diagnosis not present

## 2017-03-07 DIAGNOSIS — E039 Hypothyroidism, unspecified: Secondary | ICD-10-CM | POA: Diagnosis not present

## 2017-03-12 DIAGNOSIS — G40109 Localization-related (focal) (partial) symptomatic epilepsy and epileptic syndromes with simple partial seizures, not intractable, without status epilepticus: Secondary | ICD-10-CM | POA: Diagnosis not present

## 2017-03-12 DIAGNOSIS — Z5181 Encounter for therapeutic drug level monitoring: Secondary | ICD-10-CM | POA: Diagnosis not present

## 2017-03-15 DIAGNOSIS — H35372 Puckering of macula, left eye: Secondary | ICD-10-CM | POA: Diagnosis not present

## 2017-03-15 DIAGNOSIS — H25013 Cortical age-related cataract, bilateral: Secondary | ICD-10-CM | POA: Diagnosis not present

## 2017-03-15 DIAGNOSIS — H2513 Age-related nuclear cataract, bilateral: Secondary | ICD-10-CM | POA: Diagnosis not present

## 2017-03-15 DIAGNOSIS — H35033 Hypertensive retinopathy, bilateral: Secondary | ICD-10-CM | POA: Diagnosis not present

## 2017-03-15 DIAGNOSIS — H2512 Age-related nuclear cataract, left eye: Secondary | ICD-10-CM | POA: Diagnosis not present

## 2017-03-17 DIAGNOSIS — S8002XA Contusion of left knee, initial encounter: Secondary | ICD-10-CM | POA: Diagnosis not present

## 2017-03-17 DIAGNOSIS — S60221A Contusion of right hand, initial encounter: Secondary | ICD-10-CM | POA: Diagnosis not present

## 2017-03-17 DIAGNOSIS — S0083XA Contusion of other part of head, initial encounter: Secondary | ICD-10-CM | POA: Diagnosis not present

## 2017-03-17 DIAGNOSIS — S8001XA Contusion of right knee, initial encounter: Secondary | ICD-10-CM | POA: Diagnosis not present

## 2017-03-17 DIAGNOSIS — Z6827 Body mass index (BMI) 27.0-27.9, adult: Secondary | ICD-10-CM | POA: Diagnosis not present

## 2017-05-15 DIAGNOSIS — H2512 Age-related nuclear cataract, left eye: Secondary | ICD-10-CM | POA: Diagnosis not present

## 2017-05-15 DIAGNOSIS — H25812 Combined forms of age-related cataract, left eye: Secondary | ICD-10-CM | POA: Diagnosis not present

## 2017-06-04 DIAGNOSIS — H25011 Cortical age-related cataract, right eye: Secondary | ICD-10-CM | POA: Diagnosis not present

## 2017-06-04 DIAGNOSIS — H2511 Age-related nuclear cataract, right eye: Secondary | ICD-10-CM | POA: Diagnosis not present

## 2017-06-12 DIAGNOSIS — H2511 Age-related nuclear cataract, right eye: Secondary | ICD-10-CM | POA: Diagnosis not present

## 2017-06-12 DIAGNOSIS — H25811 Combined forms of age-related cataract, right eye: Secondary | ICD-10-CM | POA: Diagnosis not present

## 2017-06-26 DIAGNOSIS — D485 Neoplasm of uncertain behavior of skin: Secondary | ICD-10-CM | POA: Diagnosis not present

## 2017-06-26 DIAGNOSIS — L710 Perioral dermatitis: Secondary | ICD-10-CM | POA: Diagnosis not present

## 2017-06-26 DIAGNOSIS — D0439 Carcinoma in situ of skin of other parts of face: Secondary | ICD-10-CM | POA: Diagnosis not present

## 2017-06-26 DIAGNOSIS — L570 Actinic keratosis: Secondary | ICD-10-CM | POA: Diagnosis not present

## 2017-07-03 DIAGNOSIS — C44329 Squamous cell carcinoma of skin of other parts of face: Secondary | ICD-10-CM | POA: Diagnosis not present

## 2017-07-30 DIAGNOSIS — Z23 Encounter for immunization: Secondary | ICD-10-CM | POA: Diagnosis not present

## 2017-09-06 DIAGNOSIS — E039 Hypothyroidism, unspecified: Secondary | ICD-10-CM | POA: Diagnosis not present

## 2017-09-06 DIAGNOSIS — R739 Hyperglycemia, unspecified: Secondary | ICD-10-CM | POA: Diagnosis not present

## 2017-09-06 DIAGNOSIS — G40309 Generalized idiopathic epilepsy and epileptic syndromes, not intractable, without status epilepticus: Secondary | ICD-10-CM | POA: Diagnosis not present

## 2017-09-06 DIAGNOSIS — R946 Abnormal results of thyroid function studies: Secondary | ICD-10-CM | POA: Diagnosis not present

## 2017-09-06 DIAGNOSIS — Z13220 Encounter for screening for lipoid disorders: Secondary | ICD-10-CM | POA: Diagnosis not present

## 2017-09-10 DIAGNOSIS — L719 Rosacea, unspecified: Secondary | ICD-10-CM | POA: Diagnosis not present

## 2017-09-10 DIAGNOSIS — E039 Hypothyroidism, unspecified: Secondary | ICD-10-CM | POA: Diagnosis not present

## 2017-09-10 DIAGNOSIS — M542 Cervicalgia: Secondary | ICD-10-CM | POA: Diagnosis not present

## 2017-09-10 DIAGNOSIS — G40309 Generalized idiopathic epilepsy and epileptic syndromes, not intractable, without status epilepticus: Secondary | ICD-10-CM | POA: Diagnosis not present

## 2017-09-10 DIAGNOSIS — Z6828 Body mass index (BMI) 28.0-28.9, adult: Secondary | ICD-10-CM | POA: Diagnosis not present

## 2017-09-18 DIAGNOSIS — M4802 Spinal stenosis, cervical region: Secondary | ICD-10-CM | POA: Diagnosis not present

## 2017-09-18 DIAGNOSIS — M47812 Spondylosis without myelopathy or radiculopathy, cervical region: Secondary | ICD-10-CM | POA: Diagnosis not present

## 2017-09-18 DIAGNOSIS — M5023 Other cervical disc displacement, cervicothoracic region: Secondary | ICD-10-CM | POA: Diagnosis not present

## 2017-12-13 DIAGNOSIS — M5412 Radiculopathy, cervical region: Secondary | ICD-10-CM | POA: Diagnosis not present

## 2017-12-13 DIAGNOSIS — Z6828 Body mass index (BMI) 28.0-28.9, adult: Secondary | ICD-10-CM | POA: Diagnosis not present

## 2017-12-13 DIAGNOSIS — R030 Elevated blood-pressure reading, without diagnosis of hypertension: Secondary | ICD-10-CM | POA: Diagnosis not present

## 2017-12-13 DIAGNOSIS — M47812 Spondylosis without myelopathy or radiculopathy, cervical region: Secondary | ICD-10-CM | POA: Diagnosis not present

## 2017-12-13 DIAGNOSIS — M542 Cervicalgia: Secondary | ICD-10-CM | POA: Diagnosis not present

## 2017-12-25 DIAGNOSIS — D0439 Carcinoma in situ of skin of other parts of face: Secondary | ICD-10-CM | POA: Diagnosis not present

## 2017-12-25 DIAGNOSIS — D485 Neoplasm of uncertain behavior of skin: Secondary | ICD-10-CM | POA: Diagnosis not present

## 2017-12-25 DIAGNOSIS — L570 Actinic keratosis: Secondary | ICD-10-CM | POA: Diagnosis not present

## 2017-12-25 DIAGNOSIS — Z85828 Personal history of other malignant neoplasm of skin: Secondary | ICD-10-CM | POA: Diagnosis not present

## 2018-01-17 DIAGNOSIS — C44329 Squamous cell carcinoma of skin of other parts of face: Secondary | ICD-10-CM | POA: Diagnosis not present

## 2018-02-22 ENCOUNTER — Other Ambulatory Visit: Payer: Self-pay

## 2018-03-12 DIAGNOSIS — Z5181 Encounter for therapeutic drug level monitoring: Secondary | ICD-10-CM | POA: Diagnosis not present

## 2018-03-12 DIAGNOSIS — G40109 Localization-related (focal) (partial) symptomatic epilepsy and epileptic syndromes with simple partial seizures, not intractable, without status epilepticus: Secondary | ICD-10-CM | POA: Diagnosis not present

## 2018-07-02 DIAGNOSIS — D485 Neoplasm of uncertain behavior of skin: Secondary | ICD-10-CM | POA: Diagnosis not present

## 2018-07-02 DIAGNOSIS — D040 Carcinoma in situ of skin of lip: Secondary | ICD-10-CM | POA: Diagnosis not present

## 2018-07-02 DIAGNOSIS — L570 Actinic keratosis: Secondary | ICD-10-CM | POA: Diagnosis not present

## 2018-07-25 DIAGNOSIS — C4402 Squamous cell carcinoma of skin of lip: Secondary | ICD-10-CM | POA: Diagnosis not present

## 2018-08-23 DIAGNOSIS — Z6827 Body mass index (BMI) 27.0-27.9, adult: Secondary | ICD-10-CM | POA: Diagnosis not present

## 2018-08-23 DIAGNOSIS — J101 Influenza due to other identified influenza virus with other respiratory manifestations: Secondary | ICD-10-CM | POA: Diagnosis not present

## 2018-08-23 DIAGNOSIS — J069 Acute upper respiratory infection, unspecified: Secondary | ICD-10-CM | POA: Diagnosis not present

## 2018-08-29 DIAGNOSIS — M940 Chondrocostal junction syndrome [Tietze]: Secondary | ICD-10-CM | POA: Diagnosis not present

## 2018-08-29 DIAGNOSIS — R05 Cough: Secondary | ICD-10-CM | POA: Diagnosis not present

## 2018-08-29 DIAGNOSIS — Z6828 Body mass index (BMI) 28.0-28.9, adult: Secondary | ICD-10-CM | POA: Diagnosis not present

## 2018-09-11 DIAGNOSIS — R946 Abnormal results of thyroid function studies: Secondary | ICD-10-CM | POA: Diagnosis not present

## 2018-09-11 DIAGNOSIS — R739 Hyperglycemia, unspecified: Secondary | ICD-10-CM | POA: Diagnosis not present

## 2018-09-11 DIAGNOSIS — Z13220 Encounter for screening for lipoid disorders: Secondary | ICD-10-CM | POA: Diagnosis not present

## 2018-09-11 DIAGNOSIS — E039 Hypothyroidism, unspecified: Secondary | ICD-10-CM | POA: Diagnosis not present

## 2018-09-20 DIAGNOSIS — Z6828 Body mass index (BMI) 28.0-28.9, adult: Secondary | ICD-10-CM | POA: Diagnosis not present

## 2018-09-20 DIAGNOSIS — Z0000 Encounter for general adult medical examination without abnormal findings: Secondary | ICD-10-CM | POA: Diagnosis not present

## 2018-12-31 DIAGNOSIS — Z85828 Personal history of other malignant neoplasm of skin: Secondary | ICD-10-CM | POA: Diagnosis not present

## 2018-12-31 DIAGNOSIS — L819 Disorder of pigmentation, unspecified: Secondary | ICD-10-CM | POA: Diagnosis not present

## 2018-12-31 DIAGNOSIS — D485 Neoplasm of uncertain behavior of skin: Secondary | ICD-10-CM | POA: Diagnosis not present

## 2018-12-31 DIAGNOSIS — L570 Actinic keratosis: Secondary | ICD-10-CM | POA: Diagnosis not present

## 2018-12-31 DIAGNOSIS — D0439 Carcinoma in situ of skin of other parts of face: Secondary | ICD-10-CM | POA: Diagnosis not present

## 2019-01-09 DIAGNOSIS — C44329 Squamous cell carcinoma of skin of other parts of face: Secondary | ICD-10-CM | POA: Diagnosis not present

## 2019-02-03 DIAGNOSIS — Z6827 Body mass index (BMI) 27.0-27.9, adult: Secondary | ICD-10-CM | POA: Diagnosis not present

## 2019-02-03 DIAGNOSIS — G5790 Unspecified mononeuropathy of unspecified lower limb: Secondary | ICD-10-CM | POA: Insufficient documentation

## 2019-02-03 DIAGNOSIS — T24231A Burn of second degree of right lower leg, initial encounter: Secondary | ICD-10-CM | POA: Diagnosis not present

## 2019-03-13 DIAGNOSIS — Z7189 Other specified counseling: Secondary | ICD-10-CM | POA: Diagnosis not present

## 2019-03-13 DIAGNOSIS — Z79899 Other long term (current) drug therapy: Secondary | ICD-10-CM | POA: Diagnosis not present

## 2019-03-13 DIAGNOSIS — G40109 Localization-related (focal) (partial) symptomatic epilepsy and epileptic syndromes with simple partial seizures, not intractable, without status epilepticus: Secondary | ICD-10-CM | POA: Diagnosis not present

## 2019-03-13 DIAGNOSIS — Z5181 Encounter for therapeutic drug level monitoring: Secondary | ICD-10-CM | POA: Diagnosis not present

## 2019-05-16 DIAGNOSIS — Z23 Encounter for immunization: Secondary | ICD-10-CM | POA: Diagnosis not present

## 2019-07-02 DIAGNOSIS — L821 Other seborrheic keratosis: Secondary | ICD-10-CM | POA: Diagnosis not present

## 2019-07-02 DIAGNOSIS — Z85828 Personal history of other malignant neoplasm of skin: Secondary | ICD-10-CM | POA: Diagnosis not present

## 2019-07-02 DIAGNOSIS — L570 Actinic keratosis: Secondary | ICD-10-CM | POA: Diagnosis not present

## 2019-07-02 DIAGNOSIS — D239 Other benign neoplasm of skin, unspecified: Secondary | ICD-10-CM | POA: Diagnosis not present

## 2019-08-13 ENCOUNTER — Ambulatory Visit: Payer: Medicare Other | Attending: Internal Medicine

## 2019-08-13 DIAGNOSIS — Z23 Encounter for immunization: Secondary | ICD-10-CM | POA: Diagnosis not present

## 2019-08-13 NOTE — Progress Notes (Signed)
   Covid-19 Vaccination Clinic  Name:  [name]    MRN: [id number] DOB: [birth year]  08/13/2019  [name] was observed post Covid-19 immunization for 15 minutes without incidence. He was provided with Vaccine Information Sheet and instruction to access the V-Safe system.   [name] was instructed to call 911 with any severe reactions post vaccine: Marland Kitchen Difficulty breathing  . Swelling of your face and throat  . A fast heartbeat  . A bad rash all over your body  . Dizziness and weakness    Immunizations Administered    Name Date Dose VIS Date Route   Pfizer COVID-19 Vaccine 08/13/2019  1:11 PM 0.3 mL 07/04/2019 Intramuscular   Manufacturer: [place]   Lot: [id number]   Newborn: [id number]

## 2019-09-03 ENCOUNTER — Ambulatory Visit: Payer: Medicare Other | Attending: Internal Medicine

## 2019-09-03 DIAGNOSIS — Z23 Encounter for immunization: Secondary | ICD-10-CM | POA: Insufficient documentation

## 2019-09-03 NOTE — Progress Notes (Signed)
   Covid-19 Vaccination Clinic  Name:  [name]    MRN: [id number] DOB: [birth year]  09/03/2019  [name] was observed post Covid-19 immunization for 15 minutes without incidence. He was provided with Vaccine Information Sheet and instruction to access the V-Safe system.   [name] was instructed to call 911 with any severe reactions post vaccine: Marland Kitchen Difficulty breathing  . Swelling of your face and throat  . A fast heartbeat  . A bad rash all over your body  . Dizziness and weakness    Immunizations Administered    Name Date Dose VIS Date Route   Pfizer COVID-19 Vaccine 09/03/2019  8:10 AM 0.3 mL 07/04/2019 Intramuscular   Manufacturer: [employer]   Lot: [id number]   [place]: [id number]

## 2019-10-08 DIAGNOSIS — Z23 Encounter for immunization: Secondary | ICD-10-CM | POA: Diagnosis not present

## 2019-10-13 DIAGNOSIS — E039 Hypothyroidism, unspecified: Secondary | ICD-10-CM | POA: Diagnosis not present

## 2019-10-13 DIAGNOSIS — R739 Hyperglycemia, unspecified: Secondary | ICD-10-CM | POA: Diagnosis not present

## 2019-10-13 DIAGNOSIS — R946 Abnormal results of thyroid function studies: Secondary | ICD-10-CM | POA: Diagnosis not present

## 2019-10-13 DIAGNOSIS — Z13220 Encounter for screening for lipoid disorders: Secondary | ICD-10-CM | POA: Diagnosis not present

## 2019-10-13 DIAGNOSIS — N400 Enlarged prostate without lower urinary tract symptoms: Secondary | ICD-10-CM | POA: Diagnosis not present

## 2019-10-13 DIAGNOSIS — G40309 Generalized idiopathic epilepsy and epileptic syndromes, not intractable, without status epilepticus: Secondary | ICD-10-CM | POA: Diagnosis not present

## 2019-10-17 DIAGNOSIS — E039 Hypothyroidism, unspecified: Secondary | ICD-10-CM | POA: Diagnosis not present

## 2019-10-17 DIAGNOSIS — Z0000 Encounter for general adult medical examination without abnormal findings: Secondary | ICD-10-CM | POA: Diagnosis not present

## 2019-10-17 DIAGNOSIS — Z6827 Body mass index (BMI) 27.0-27.9, adult: Secondary | ICD-10-CM | POA: Diagnosis not present

## 2019-10-17 DIAGNOSIS — L719 Rosacea, unspecified: Secondary | ICD-10-CM | POA: Diagnosis not present

## 2019-10-17 DIAGNOSIS — G40309 Generalized idiopathic epilepsy and epileptic syndromes, not intractable, without status epilepticus: Secondary | ICD-10-CM | POA: Diagnosis not present

## 2019-10-17 DIAGNOSIS — Z23 Encounter for immunization: Secondary | ICD-10-CM | POA: Diagnosis not present

## 2019-10-22 ENCOUNTER — Encounter (INDEPENDENT_AMBULATORY_CARE_PROVIDER_SITE_OTHER): Payer: Self-pay | Admitting: *Deleted

## 2019-11-05 DIAGNOSIS — Z23 Encounter for immunization: Secondary | ICD-10-CM | POA: Diagnosis not present

## 2020-04-13 DIAGNOSIS — G40109 Localization-related (focal) (partial) symptomatic epilepsy and epileptic syndromes with simple partial seizures, not intractable, without status epilepticus: Secondary | ICD-10-CM | POA: Diagnosis not present

## 2020-04-21 DIAGNOSIS — H35033 Hypertensive retinopathy, bilateral: Secondary | ICD-10-CM | POA: Diagnosis not present

## 2020-04-21 DIAGNOSIS — H35363 Drusen (degenerative) of macula, bilateral: Secondary | ICD-10-CM | POA: Diagnosis not present

## 2020-04-21 DIAGNOSIS — H40013 Open angle with borderline findings, low risk, bilateral: Secondary | ICD-10-CM | POA: Diagnosis not present

## 2020-04-21 DIAGNOSIS — H3561 Retinal hemorrhage, right eye: Secondary | ICD-10-CM | POA: Diagnosis not present

## 2020-05-03 DIAGNOSIS — R319 Hematuria, unspecified: Secondary | ICD-10-CM | POA: Diagnosis not present

## 2020-05-03 DIAGNOSIS — E039 Hypothyroidism, unspecified: Secondary | ICD-10-CM | POA: Diagnosis not present

## 2020-05-03 DIAGNOSIS — G40309 Generalized idiopathic epilepsy and epileptic syndromes, not intractable, without status epilepticus: Secondary | ICD-10-CM | POA: Diagnosis not present

## 2020-05-11 DIAGNOSIS — Z0000 Encounter for general adult medical examination without abnormal findings: Secondary | ICD-10-CM | POA: Diagnosis not present

## 2020-05-11 DIAGNOSIS — N200 Calculus of kidney: Secondary | ICD-10-CM | POA: Diagnosis not present

## 2020-05-11 DIAGNOSIS — K429 Umbilical hernia without obstruction or gangrene: Secondary | ICD-10-CM | POA: Diagnosis not present

## 2020-05-11 DIAGNOSIS — N210 Calculus in bladder: Secondary | ICD-10-CM | POA: Diagnosis not present

## 2020-05-11 DIAGNOSIS — R310 Gross hematuria: Secondary | ICD-10-CM | POA: Diagnosis not present

## 2020-06-29 DIAGNOSIS — D485 Neoplasm of uncertain behavior of skin: Secondary | ICD-10-CM | POA: Diagnosis not present

## 2020-06-29 DIAGNOSIS — L570 Actinic keratosis: Secondary | ICD-10-CM | POA: Diagnosis not present

## 2020-06-29 DIAGNOSIS — L309 Dermatitis, unspecified: Secondary | ICD-10-CM | POA: Diagnosis not present

## 2020-06-29 DIAGNOSIS — Z85828 Personal history of other malignant neoplasm of skin: Secondary | ICD-10-CM | POA: Diagnosis not present

## 2020-06-29 DIAGNOSIS — B352 Tinea manuum: Secondary | ICD-10-CM | POA: Diagnosis not present

## 2020-07-07 ENCOUNTER — Ambulatory Visit (INDEPENDENT_AMBULATORY_CARE_PROVIDER_SITE_OTHER): Payer: Medicare Other | Admitting: Urology

## 2020-07-07 ENCOUNTER — Encounter: Payer: Self-pay | Admitting: Urology

## 2020-07-07 ENCOUNTER — Other Ambulatory Visit: Payer: Self-pay

## 2020-07-07 VITALS — BP 143/74 | HR 61 | Temp 97.7°F | Ht 67.0 in | Wt 165.0 lb

## 2020-07-07 DIAGNOSIS — R310 Gross hematuria: Secondary | ICD-10-CM | POA: Diagnosis not present

## 2020-07-07 DIAGNOSIS — N210 Calculus in bladder: Secondary | ICD-10-CM | POA: Diagnosis not present

## 2020-07-07 MED ORDER — TAMSULOSIN HCL 0.4 MG PO CAPS: 0.4000 mg | ORAL_CAPSULE | Freq: Every day | ORAL | 11 refills | Status: DC

## 2020-07-07 MED ORDER — CIPROFLOXACIN HCL 500 MG PO TABS
500.0000 mg | ORAL_TABLET | Freq: Once | ORAL | Status: AC
  Administered 2020-07-07: 500 mg via ORAL

## 2020-07-07 NOTE — Progress Notes (Signed)
07/07/2020 12:05 PM   [name] 24-Mar-1941 [id number]  Referring provider: Rory Percy, MD [place],  [place] [address]  Gross hematuria  HPI: [name] is a 79yo here for evaluation of gross hematuria. 6-8 weeks ago he develop 3 episodes of gross painless hematuria. He previously seen by Dr. Exie Parody for nephrolithiasis. He had cystoscopy 5-6 years ago with Dr. Exie Parody. He passes small calculi multiple times per year. He has nocturia 3x, intermittent dysuria.  He has a 15pk yea smoking hx.  He underwent CT on 05/11/2020 which showed bilateral renal calculi and multiple bladder calculi   PMH: Past Medical History:  Diagnosis Date  . History of colon polyps    removed with colonoscopy  . Kidney stone    in past-has one at present, not bothersome  . Seizures ([place])    none in 10 yrs- controlled with meds(Dr. Ned Clines [phone])    Surgical History: Past Surgical History:  Procedure Laterality Date  . CHOLECYSTECTOMY  07/25/2012   Procedure: LAPAROSCOPIC CHOLECYSTECTOMY WITH INTRAOPERATIVE CHOLANGIOGRAM;  Surgeon: Ralene Ok, MD;  Location: [facility];  Service: General;;  . COLONOSCOPY  11/15/2011   Procedure: COLONOSCOPY;  Surgeon: Rogene Houston, MD;  Location: [facility];  Service: Endoscopy;  Laterality: N/A;  730  . ELBOW SURGERY     chipped bone right elbow  . TONSILLECTOMY    . VASECTOMY      Home Medications:  Allergies as of 07/07/2020      Reactions   Sulfa Antibiotics Hives, Rash      Medication List       Accurate as of July 07, 2020 12:05 PM. If you have any questions, ask your nurse or doctor.        STOP taking these medications   oxyCODONE-acetaminophen 5-325 MG tablet Commonly known as: Roxicet Stopped by: Nicolette Bang, MD     TAKE these medications   amoxicillin 500 MG capsule Commonly known as: AMOXIL Take 500 mg by mouth 3 (three) times daily.   aspirin 81 MG tablet Take 81 mg by mouth at bedtime.   aspirin  81 MG EC tablet Take by mouth.   Centrum Silver Adult 50+ Tabs Take 1 tablet by mouth daily.   metroNIDAZOLE 1 % gel Commonly known as: METROGEL Apply 1 application topically daily as needed. Apply to face   Multi-Vitamin tablet Take 1 tablet by mouth daily.   Oxcarbazepine 300 MG tablet Commonly known as: TRILEPTAL Take 300 mg by mouth 2 (two) times daily.   sodium chloride 0.65 % nasal spray Commonly known as: OCEAN Place 1 spray into the nose daily as needed. For nasal congestion   Synthroid 50 MCG tablet Generic drug: levothyroxine Take 50 mcg by mouth daily.   triamcinolone ointment 0.5 % Commonly known as: KENALOG Apply 1 application topically 2 (two) times daily.   zonisamide 100 MG capsule Commonly known as: ZONEGRAN Take 400 mg by mouth daily.       Allergies:  Allergies  Allergen Reactions  . Sulfa Antibiotics Hives and Rash    Family History: History reviewed. No pertinent family history.  Social History:  reports that he quit smoking about 37 years ago. His smoking use included cigarettes. He smoked 0.50 packs per day. He does not have any smokeless tobacco history on file. He reports current alcohol use of about 1.0 standard drink of alcohol per week. He reports that he does not use drugs.  ROS: All other review of  systems were reviewed and are negative except what is noted above in HPI  Physical Exam: BP (!) 143/74   Pulse 61   Temp 97.7 F (36.5 C)   Ht 5\' 7"  (1.702 m)   Wt 165 lb (74.8 kg)   BMI 25.84 kg/m   Constitutional:  Alert and oriented, No acute distress. HEENT: [place] AT, moist mucus membranes.  Trachea midline, no masses. Cardiovascular: No clubbing, cyanosis, or edema. Respiratory: Normal respiratory effort, no increased work of breathing. GI: Abdomen is soft, nontender, nondistended, no abdominal masses GU: No CVA tenderness.  Lymph: No cervical or inguinal lymphadenopathy. Skin: No rashes, bruises or suspicious  lesions. Neurologic: Grossly intact, no focal deficits, moving all 4 extremities. Psychiatric: Normal mood and affect.  Laboratory Data: Lab Results  Component Value Date   WBC 7.7 07/19/2012   HGB 14.3 07/19/2012   HCT 42.6 07/19/2012   MCV 89.5 07/19/2012   PLT 329 07/19/2012    Lab Results  Component Value Date   CREATININE 1.02 07/19/2012    No results found for: PSA  No results found for: TESTOSTERONE  No results found for: HGBA1C  Urinalysis No results found for: COLORURINE, APPEARANCEUR, LABSPEC, PHURINE, GLUCOSEU, HGBUR, BILIRUBINUR, KETONESUR, PROTEINUR, UROBILINOGEN, NITRITE, LEUKOCYTESUR  No results found for: LABMICR, [place], RBCUA, LABEPIT, MUCUS, BACTERIA  Pertinent Imaging: CT 05/11/2020: Images reviewed and discussed with the patient No results found for this or any previous visit.  No results found for this or any previous visit.  No results found for this or any previous visit.  No results found for this or any previous visit.  No results found for this or any previous visit.  No results found for this or any previous visit.  No results found for this or any previous visit.  No results found for this or any previous visit.   Assessment & Plan:    1. Gross hematuria -likely related to bladder calculus - BLADDER SCAN AMB NON-IMAGING  2. Bladder calculi: -we discussed the management including cystolithalopaxy and the patient defers therapy at this time     Nicolette Bang, MD  [facility]      Cystoscopy Procedure Note  Patient identification was confirmed, informed consent was obtained, and patient was prepped using Betadine solution.  Lidocaine jelly was administered per urethral meatus.     Pre-Procedure: - Inspection reveals a normal caliber ureteral meatus.  Procedure: The flexible cystoscope was introduced without difficulty - No urethral strictures/lesions are present. - Enlarged prostate  -  Elevated bladder neck - Bilateral ureteral orifices identified - Bladder mucosa  reveals no ulcers, tumors, or lesions - numerous 2-85mm bladder stones - No trabeculation  Retroflexion shows 3cm intravesical prostatic protrusion   Post-Procedure: - Patient tolerated the procedure well  Assessment/ Plan:   Return in about 4 weeks (around 08/04/2020) for pvr.  Nicolette Bang, MD

## 2020-07-07 NOTE — Progress Notes (Signed)
Bladder Scan Patient can void: 78 ml Performed By: Judaea Burgoon,lpn   Urological Symptom Review  Patient is experiencing the following symptoms: Frequent urination Hard to postpone urination Get up at night to urinate Blood in urine Erection problems (male only)  Kidney stones  Review of Systems  Gastrointestinal (upper)  : Negative for upper GI symptoms  Gastrointestinal (lower) : Negative for lower GI symptoms  Constitutional : Negative for symptoms  Skin: Skin rash/lesion  Eyes: Negative for eye symptoms  Ear/Nose/Throat : Negative for Ear/Nose/Throat symptoms  Hematologic/Lymphatic: Easy bruising  Cardiovascular : Negative for cardiovascular symptoms  Respiratory : Negative for respiratory symptoms  Endocrine: Negative for endocrine symptoms  Musculoskeletal: Joint pain  Neurological: Negative for neurological symptoms  Psychologic: Negative for psychiatric symptoms

## 2020-07-07 NOTE — Patient Instructions (Signed)
Hematuria, Adult Hematuria is blood in the urine. Blood may be visible in the urine, or it may be identified with a test. This condition can be caused by infections of the bladder, urethra, kidney, or prostate. Other possible causes include:  Kidney stones.  Cancer of the urinary tract.  Too much calcium in the urine.  Conditions that are passed from parent to child (inherited conditions).  Exercise that requires a lot of energy. Infections can usually be treated with medicine, and a kidney stone usually will pass through your urine. If neither of these is the cause of your hematuria, more tests may be needed to identify the cause of your symptoms. It is very important to tell your health care provider about any blood in your urine, even if it is painless or the blood stops without treatment. Blood in the urine, when it happens and then stops and then happens again, can be a symptom of a very serious condition, including cancer. There is no pain in the initial stages of many urinary cancers. Follow these instructions at home: Medicines  Take over-the-counter and prescription medicines only as told by your health care provider.  If you were prescribed an antibiotic medicine, take it as told by your health care provider. Do not stop taking the antibiotic even if you start to feel better. Eating and drinking  Drink enough fluid to keep your urine clear or pale yellow. It is recommended that you drink 3-4 quarts (2.8-3.8 L) a day. If you have been diagnosed with an infection, it is recommended that you drink cranberry juice in addition to large amounts of water.  Avoid caffeine, tea, and carbonated beverages. These tend to irritate the bladder.  Avoid alcohol because it may irritate the prostate (men). General instructions  If you have been diagnosed with a kidney stone, follow your health care provider's instructions about straining your urine to catch the stone.  Empty your bladder  often. Avoid holding urine for long periods of time.  If you are male: ? After a bowel movement, wipe from front to back and use each piece of toilet paper only once. ? Empty your bladder before and after sex.  Pay attention to any changes in your symptoms. Tell your health care provider about any changes or any new symptoms.  It is your responsibility to get your test results. Ask your health care provider, or the department performing the test, when your results will be ready.  Keep all follow-up visits as told by your health care provider. This is important. Contact a health care provider if:  You develop back pain.  You have a fever.  You have nausea or vomiting.  Your symptoms do not improve after 3 days.  Your symptoms get worse. Get help right away if:  You develop severe vomiting and are unable take medicine without vomiting.  You develop severe pain in your back or abdomen even though you are taking medicine.  You pass a large amount of blood in your urine.  You pass blood clots in your urine.  You feel very weak or like you might faint.  You faint. Summary  Hematuria is blood in the urine. It has many possible causes.  It is very important that you tell your health care provider about any blood in your urine, even if it is painless or the blood stops without treatment.  Take over-the-counter and prescription medicines only as told by your health care provider.  Drink enough fluid to keep   your urine clear or pale yellow. This information is not intended to replace advice given to you by your health care provider. Make sure you discuss any questions you have with your health care provider. Document Revised: 12/04/2018 Document Reviewed: 08/12/2016 Elsevier Patient Education  2020 Elsevier Inc.  

## 2020-07-12 DIAGNOSIS — Z23 Encounter for immunization: Secondary | ICD-10-CM | POA: Diagnosis not present

## 2020-08-06 ENCOUNTER — Other Ambulatory Visit: Payer: Self-pay

## 2020-08-06 ENCOUNTER — Encounter: Payer: Self-pay | Admitting: Urology

## 2020-08-06 ENCOUNTER — Ambulatory Visit (INDEPENDENT_AMBULATORY_CARE_PROVIDER_SITE_OTHER): Payer: Medicare Other | Admitting: Urology

## 2020-08-06 VITALS — BP 124/75 | HR 62 | Temp 98.1°F | Ht 67.5 in | Wt 164.0 lb

## 2020-08-06 DIAGNOSIS — N210 Calculus in bladder: Secondary | ICD-10-CM | POA: Diagnosis not present

## 2020-08-06 DIAGNOSIS — R310 Gross hematuria: Secondary | ICD-10-CM | POA: Insufficient documentation

## 2020-08-06 LAB — URINALYSIS, ROUTINE W REFLEX MICROSCOPIC
Bilirubin, UA: NEGATIVE
Glucose, UA: NEGATIVE
Nitrite, UA: NEGATIVE
Specific Gravity, UA: 1.02 (ref 1.005–1.030)
Urobilinogen, Ur: 0.2 mg/dL (ref 0.2–1.0)
pH, UA: 7 (ref 5.0–7.5)

## 2020-08-06 LAB — BLADDER SCAN AMB NON-IMAGING: Scan Result: 56

## 2020-08-06 LAB — MICROSCOPIC EXAMINATION
Bacteria, UA: NONE SEEN
Epithelial Cells (non renal): NONE SEEN /hpf (ref 0–10)
Renal Epithel, UA: NONE SEEN /hpf

## 2020-08-06 NOTE — Progress Notes (Signed)
Bladder Scan Patient can void: 56 ml Performed By: Cypher Paule,lpn   Urological Symptom Review  Patient is experiencing the following symptoms: Frequent urination Hard to postpone urination Get up at night to urinate Blood in urine Erection problems (male only)  Kidney stones  Review of Systems  Gastrointestinal (upper)  : Negative for upper GI symptoms  Gastrointestinal (lower) : Negative for lower GI symptoms  Constitutional : Negative for symptoms  Skin: Negative for skin symptoms  Eyes: Negative for eye symptoms  Ear/Nose/Throat : Negative for Ear/Nose/Throat symptoms  Hematologic/Lymphatic: Negative for Hematologic/Lymphatic symptoms  Cardiovascular : Negative for cardiovascular symptoms  Respiratory : Negative for respiratory symptoms  Endocrine: Negative for endocrine symptoms  Musculoskeletal: Joint pain  Neurological: Negative for neurological symptoms  Psychologic: Negative for psychiatric symptoms

## 2020-08-06 NOTE — Progress Notes (Signed)
08/06/2020 11:20 AM   [name] Oct 19, 1940 [id number]  Referring provider: Rory Percy, MD [place],  [place] [address]  followup gross hematuria  HPI: [name] is a 80yo here for followup for gross hematuria and bladder calculi. He has passed 2 calculi since last visit. No gross hematuria. He was started on flomax 0.4mg  daily last visit and he notes improvement in his urinary stream. Nocturia 2-3x. Hesitancy, frequency, urgency and dysuria improved significantly. Overall he is happy with his urination. No new complaints today.   PMH: Past Medical History:  Diagnosis Date  . History of colon polyps    removed with colonoscopy  . Kidney stone    in past-has one at present, not bothersome  . Seizures ([place])    none in 10 yrs- controlled with meds([name] [phone])    Surgical History: Past Surgical History:  Procedure Laterality Date  . CHOLECYSTECTOMY  07/25/2012   Procedure: LAPAROSCOPIC CHOLECYSTECTOMY WITH INTRAOPERATIVE CHOLANGIOGRAM;  Surgeon: Ralene Ok, MD;  Location: [facility];  Service: General;;  . COLONOSCOPY  11/15/2011   Procedure: COLONOSCOPY;  Surgeon: Rogene Houston, MD;  Location: [facility];  Service: Endoscopy;  Laterality: N/A;  730  . ELBOW SURGERY     chipped bone right elbow  . TONSILLECTOMY    . VASECTOMY      Home Medications:  Allergies as of 08/06/2020      Reactions   Sulfa Antibiotics Hives, Rash      Medication List       Accurate as of August 06, 2020 11:20 AM. If you have any questions, ask your nurse or doctor.        STOP taking these medications   amoxicillin 500 MG capsule Commonly known as: AMOXIL Stopped by: Fernando Bang, MD     TAKE these medications   aspirin 81 MG tablet Take 81 mg by mouth at bedtime.   aspirin 81 MG EC tablet Take by mouth.   Centrum Silver Adult 50+ Tabs Take 1 tablet by mouth daily.   metroNIDAZOLE 1 % gel Commonly known as: METROGEL Apply 1 application  topically daily as needed. Apply to face   Multi-Vitamin tablet Take 1 tablet by mouth daily.   Oxcarbazepine 300 MG tablet Commonly known as: TRILEPTAL Take 300 mg by mouth 2 (two) times daily.   sodium chloride 0.65 % nasal spray Commonly known as: OCEAN Place 1 spray into the nose daily as needed. For nasal congestion   Synthroid 50 MCG tablet Generic drug: levothyroxine Take 50 mcg by mouth daily.   tamsulosin 0.4 MG Caps capsule Commonly known as: FLOMAX Take 1 capsule (0.4 mg total) by mouth daily.   triamcinolone ointment 0.5 % Commonly known as: KENALOG Apply 1 application topically 2 (two) times daily.   zonisamide 100 MG capsule Commonly known as: ZONEGRAN Take 400 mg by mouth daily.       Allergies:  Allergies  Allergen Reactions  . Sulfa Antibiotics Hives and Rash    Family History: No family history on file.  Social History:  reports that he quit smoking about 38 years ago. His smoking use included cigarettes. He smoked 0.50 packs per day. He does not have any smokeless tobacco history on file. He reports current alcohol use of about 1.0 standard drink of alcohol per week. He reports that he does not use drugs.  ROS: All other review of systems were reviewed and are negative except what is noted above in HPI  Physical Exam: BP 124/75   Pulse 62   Temp 98.1 F (36.7 C)   Ht 5' 7.5" (1.715 m)   Wt 164 lb (74.4 kg)   BMI 25.31 kg/m   Constitutional:  Alert and oriented, No acute distress. HEENT: [place] AT, moist mucus membranes.  Trachea midline, no masses. Cardiovascular: No clubbing, cyanosis, or edema. Respiratory: Normal respiratory effort, no increased work of breathing. GI: Abdomen is soft, nontender, nondistended, no abdominal masses GU: No CVA tenderness.  Lymph: No cervical or inguinal lymphadenopathy. Skin: No rashes, bruises or suspicious lesions. Neurologic: Grossly intact, no focal deficits, moving all 4 extremities. Psychiatric:  Normal mood and affect.  Laboratory Data: Lab Results  Component Value Date   WBC 7.7 07/19/2012   HGB 14.3 07/19/2012   HCT 42.6 07/19/2012   MCV 89.5 07/19/2012   PLT 329 07/19/2012    Lab Results  Component Value Date   CREATININE 1.02 07/19/2012    No results found for: PSA  No results found for: TESTOSTERONE  No results found for: HGBA1C  Urinalysis No results found for: COLORURINE, APPEARANCEUR, LABSPEC, PHURINE, GLUCOSEU, HGBUR, BILIRUBINUR, KETONESUR, PROTEINUR, UROBILINOGEN, NITRITE, LEUKOCYTESUR  No results found for: LABMICR, [place], RBCUA, LABEPIT, MUCUS, BACTERIA  Pertinent Imaging:  No results found for this or any previous visit.  No results found for this or any previous visit.  No results found for this or any previous visit.  No results found for this or any previous visit.  No results found for this or any previous visit.  No results found for this or any previous visit.  No results found for this or any previous visit.  No results found for this or any previous visit.   Assessment & Plan:    1. Gross hematuria -resolved - BLADDER SCAN AMB NON-IMAGING - Urinalysis, Routine w reflex microscopic  2. BPH with LUTS, incomplete emptying -Continue flomax 0.4mg  daily -RTC 6 months with PVR   No follow-ups on file.  Fernando Bang, MD  [facility] [place]

## 2020-08-06 NOTE — Patient Instructions (Signed)

## 2020-08-17 DIAGNOSIS — N210 Calculus in bladder: Secondary | ICD-10-CM | POA: Insufficient documentation

## 2020-08-26 DIAGNOSIS — L570 Actinic keratosis: Secondary | ICD-10-CM | POA: Diagnosis not present

## 2020-08-26 DIAGNOSIS — H40013 Open angle with borderline findings, low risk, bilateral: Secondary | ICD-10-CM | POA: Diagnosis not present

## 2020-08-26 DIAGNOSIS — H3561 Retinal hemorrhage, right eye: Secondary | ICD-10-CM | POA: Diagnosis not present

## 2020-08-26 DIAGNOSIS — H35372 Puckering of macula, left eye: Secondary | ICD-10-CM | POA: Diagnosis not present

## 2020-08-26 DIAGNOSIS — H35363 Drusen (degenerative) of macula, bilateral: Secondary | ICD-10-CM | POA: Diagnosis not present

## 2020-08-26 DIAGNOSIS — H35033 Hypertensive retinopathy, bilateral: Secondary | ICD-10-CM | POA: Diagnosis not present

## 2020-09-15 DIAGNOSIS — Z20828 Contact with and (suspected) exposure to other viral communicable diseases: Secondary | ICD-10-CM | POA: Diagnosis not present

## 2020-09-15 DIAGNOSIS — J069 Acute upper respiratory infection, unspecified: Secondary | ICD-10-CM | POA: Diagnosis not present

## 2020-09-15 DIAGNOSIS — J0100 Acute maxillary sinusitis, unspecified: Secondary | ICD-10-CM | POA: Diagnosis not present

## 2020-10-15 DIAGNOSIS — R946 Abnormal results of thyroid function studies: Secondary | ICD-10-CM | POA: Diagnosis not present

## 2020-10-15 DIAGNOSIS — R739 Hyperglycemia, unspecified: Secondary | ICD-10-CM | POA: Diagnosis not present

## 2020-10-15 DIAGNOSIS — Z13220 Encounter for screening for lipoid disorders: Secondary | ICD-10-CM | POA: Diagnosis not present

## 2020-10-15 DIAGNOSIS — E039 Hypothyroidism, unspecified: Secondary | ICD-10-CM | POA: Diagnosis not present

## 2020-10-15 DIAGNOSIS — G40309 Generalized idiopathic epilepsy and epileptic syndromes, not intractable, without status epilepticus: Secondary | ICD-10-CM | POA: Diagnosis not present

## 2020-10-18 DIAGNOSIS — E039 Hypothyroidism, unspecified: Secondary | ICD-10-CM | POA: Diagnosis not present

## 2020-10-18 DIAGNOSIS — Z0001 Encounter for general adult medical examination with abnormal findings: Secondary | ICD-10-CM | POA: Diagnosis not present

## 2020-10-18 DIAGNOSIS — Z6827 Body mass index (BMI) 27.0-27.9, adult: Secondary | ICD-10-CM | POA: Diagnosis not present

## 2020-10-18 DIAGNOSIS — G40309 Generalized idiopathic epilepsy and epileptic syndromes, not intractable, without status epilepticus: Secondary | ICD-10-CM | POA: Diagnosis not present

## 2020-10-18 DIAGNOSIS — R319 Hematuria, unspecified: Secondary | ICD-10-CM | POA: Diagnosis not present

## 2020-10-19 ENCOUNTER — Encounter (INDEPENDENT_AMBULATORY_CARE_PROVIDER_SITE_OTHER): Payer: Self-pay | Admitting: *Deleted

## 2020-11-18 ENCOUNTER — Other Ambulatory Visit (INDEPENDENT_AMBULATORY_CARE_PROVIDER_SITE_OTHER): Payer: Self-pay | Admitting: *Deleted

## 2020-11-18 ENCOUNTER — Telehealth (INDEPENDENT_AMBULATORY_CARE_PROVIDER_SITE_OTHER): Payer: Self-pay | Admitting: *Deleted

## 2020-11-18 ENCOUNTER — Other Ambulatory Visit (INDEPENDENT_AMBULATORY_CARE_PROVIDER_SITE_OTHER): Payer: Self-pay

## 2020-11-18 ENCOUNTER — Encounter (INDEPENDENT_AMBULATORY_CARE_PROVIDER_SITE_OTHER): Payer: Self-pay | Admitting: *Deleted

## 2020-11-18 DIAGNOSIS — Z800 Family history of malignant neoplasm of digestive organs: Secondary | ICD-10-CM

## 2020-11-18 DIAGNOSIS — Z86010 Personal history of colonic polyps: Secondary | ICD-10-CM

## 2020-11-18 MED ORDER — PEG 3350-KCL-NA BICARB-NACL 420 G PO SOLR: 4000.0000 mL | Freq: Once | ORAL | 0 refills | Status: AC

## 2020-11-18 NOTE — Telephone Encounter (Signed)
Referring MD/PCP: thompson  Procedure: tcs w propofol  Reason/Indication:  Hx polyps, fam hx colon ca  Has patient had this procedure before?  Yes, 2013  If so, when, by whom and where?    Is there a family history of colon cancer?  Yes, mother  Who?  What age when diagnosed?    Is patient diabetic? If yes, Type 1 or Type 2   no      Does patient have prosthetic heart valve or mechanical valve?  no  Do you have a pacemaker/defibrillator?  no  Has patient ever had endocarditis/atrial fibrillation? no  Have you had a stroke/heart attack last 6 mths? no  Does patient use oxygen? no  Has patient had joint replacement within last 12 months?  no  Is patient constipated or do they take laxatives? no  Does patient have a history of alcohol/drug use?  no  Is patient on blood thinner such as Coumadin, Plavix and/or Aspirin? yes  Do you take medicine for weight loss?  no  For male patients,: do you still have your menstrual cycle? n/a  Medications: zonegran 700 mg 4 caps daily, oxcarbazepine 300 mg tid, synthroid 50 mcg daily, tamsulosin 0.4 mg daily, mvi daily, metronidazole daily  Allergies: sulfur drugs  Medication Adjustment per Dr Rehman/Dr Jenetta Downer   Procedure date & time: 12/08/20

## 2020-11-18 NOTE — Telephone Encounter (Signed)
Patient needs trilyte 

## 2020-11-18 NOTE — Telephone Encounter (Signed)
Done

## 2020-11-29 ENCOUNTER — Telehealth (INDEPENDENT_AMBULATORY_CARE_PROVIDER_SITE_OTHER): Payer: Self-pay

## 2020-11-29 MED ORDER — PEG 3350-KCL-NA BICARB-NACL 420 G PO SOLR: 4000.0000 mL | ORAL | 0 refills | Status: DC

## 2020-11-29 NOTE — Telephone Encounter (Signed)
LeighAnn Abisai Coble, CMA  

## 2020-12-03 NOTE — Patient Instructions (Signed)
[name]  12/03/2020     @PREFPERIOPPHARMACY @   Your procedure is scheduled on  12/08/2020   Report to [facility] at  [place].M.   Call this number if you have problems the morning of surgery:  [phone]   Remember:  Follow the diet and pre instructions given to you by the office.                   Take these medicines the morning of surgery with A SIP OF WATER  Trileptal, synthroid, zonegran.     Please brush your teeth.  Do not wear jewelry, make-up or nail polish.  Do not wear lotions, powders, or perfumes, or deodorant.  Do not shave 48 hours prior to surgery.  Men may shave face and neck.  Do not bring valuables to the hospital.  [facility] is not responsible for any belongings or valuables.  Contacts, dentures or bridgework may not be worn into surgery.  Leave your suitcase in the car.  After surgery it may be brought to your room.  For patients admitted to the hospital, discharge time will be determined by your treatment team.  Patients discharged the day of surgery will not be allowed to drive home and must have someone with them for 24 hours.   Special instructions:  DO NOT smoke tobacco or vape for 24 hours before your procedure.  Please read over the following fact sheets that you were given. Anesthesia Post-op Instructions and Care and Recovery After Surgery       Colonoscopy, Adult, Care After This sheet gives you information about how to care for yourself after your procedure. Your health care provider may also give you more specific instructions. If you have problems or questions, contact your health care provider. What can I expect after the procedure? After the procedure, it is common to have:  A small amount of blood in your stool for 24 hours after the procedure.  Some gas.  Mild cramping or bloating of your abdomen. Follow these instructions at home: Eating and drinking  Drink enough fluid to keep your urine pale  yellow.  Follow instructions from your health care provider about eating or drinking restrictions.  Resume your normal diet as instructed by your health care provider. Avoid heavy or fried foods that are hard to digest.   Activity  Rest as told by your health care provider.  Avoid sitting for a long time without moving. Get up to take short walks every 1-2 hours. This is important to improve blood flow and breathing. Ask for help if you feel weak or unsteady.  Return to your normal activities as told by your health care provider. Ask your health care provider what activities are safe for you. Managing cramping and bloating  Try walking around when you have cramps or feel bloated.  Apply heat to your abdomen as told by your health care provider. Use the heat source that your health care provider recommends, such as a moist heat pack or a heating pad. ? Place a towel between your skin and the heat source. ? Leave the heat on for 20-30 minutes. ? Remove the heat if your skin turns bright red. This is especially important if you are unable to feel pain, heat, or cold. You may have a greater risk of getting burned.   General instructions  If you were given a sedative during the procedure, it can affect you for  several hours. Do not drive or operate machinery until your health care provider says that it is safe.  For the first 24 hours after the procedure: ? Do not sign important documents. ? Do not drink alcohol. ? Do your regular daily activities at a slower pace than normal. ? Eat soft foods that are easy to digest.  Take over-the-counter and prescription medicines only as told by your health care provider.  Keep all follow-up visits as told by your health care provider. This is important. Contact a health care provider if:  You have blood in your stool 2-3 days after the procedure. Get help right away if you have:  More than a small spotting of blood in your stool.  Large blood  clots in your stool.  Swelling of your abdomen.  Nausea or vomiting.  A fever.  Increasing pain in your abdomen that is not relieved with medicine. Summary  After the procedure, it is common to have a small amount of blood in your stool. You may also have mild cramping and bloating of your abdomen.  If you were given a sedative during the procedure, it can affect you for several hours. Do not drive or operate machinery until your health care provider says that it is safe.  Get help right away if you have a lot of blood in your stool, nausea or vomiting, a fever, or increased pain in your abdomen. This information is not intended to replace advice given to you by your health care provider. Make sure you discuss any questions you have with your health care provider. Document Revised: 07/04/2019 Document Reviewed: 02/03/2019 Elsevier Patient Education  2021 [employer] [place] After This sheet gives you information about how to care for yourself after your procedure. Your health care provider may also give you more specific instructions. If you have problems or questions, contact your health care provider. What can I expect after the procedure? After the procedure, it is common to have:  Tiredness.  Forgetfulness about what happened after the procedure.  Impaired judgment for important decisions.  Nausea or vomiting.  Some difficulty with balance. Follow these instructions at home: For the time period you were told by your health care provider:  Rest as needed.  Do not participate in activities where you could fall or become injured.  Do not drive or use machinery.  Do not drink alcohol.  Do not take sleeping pills or medicines that cause drowsiness.  Do not make important decisions or sign legal documents.  Do not take care of children on your own.      Eating and drinking  Follow the diet that is recommended by your health care  provider.  Drink enough fluid to keep your urine pale yellow.  If you vomit: ? Drink water, juice, or soup when you can drink without vomiting. ? Make sure you have little or no nausea before eating solid foods. General instructions  Have a responsible adult stay with you for the time you are told. It is important to have someone help care for you until you are awake and alert.  Take over-the-counter and prescription medicines only as told by your health care provider.  If you have sleep apnea, surgery and certain medicines can increase your risk for breathing problems. Follow instructions from your health care provider about wearing your sleep device: ? Anytime you are sleeping, including during daytime naps. ? While taking prescription pain medicines, sleeping medicines, or medicines that make  you drowsy.  Avoid smoking.  Keep all follow-up visits as told by your health care provider. This is important. Contact a health care provider if:  You keep feeling nauseous or you keep vomiting.  You feel light-headed.  You are still sleepy or having trouble with balance after 24 hours.  You develop a rash.  You have a fever.  You have redness or swelling around the IV site. Get help right away if:  You have trouble breathing.  You have new-onset confusion at home. Summary  For several hours after your procedure, you may feel tired. You may also be forgetful and have poor judgment.  Have a responsible adult stay with you for the time you are told. It is important to have someone help care for you until you are awake and alert.  Rest as told. Do not drive or operate machinery. Do not drink alcohol or take sleeping pills.  Get help right away if you have trouble breathing, or if you suddenly become confused. This information is not intended to replace advice given to you by your health care provider. Make sure you discuss any questions you have with your health care  provider. Document Revised: 03/25/2020 Document Reviewed: 06/12/2019 Elsevier Patient Education  2021 [employer].

## 2020-12-06 ENCOUNTER — Encounter (HOSPITAL_COMMUNITY)
Admission: RE | Admit: 2020-12-06 | Discharge: 2020-12-06 | Disposition: A | Discharge status: Home / Self Care | Payer: Medicare Other | Source: Ambulatory Visit | Attending: Internal Medicine | Admitting: Internal Medicine

## 2020-12-06 ENCOUNTER — Other Ambulatory Visit (INDEPENDENT_AMBULATORY_CARE_PROVIDER_SITE_OTHER): Payer: Self-pay

## 2020-12-06 ENCOUNTER — Encounter (HOSPITAL_COMMUNITY): Payer: Self-pay

## 2020-12-06 ENCOUNTER — Other Ambulatory Visit: Payer: Self-pay

## 2020-12-06 ENCOUNTER — Other Ambulatory Visit (HOSPITAL_COMMUNITY)
Admission: RE | Admit: 2020-12-06 | Discharge: 2020-12-06 | Disposition: A | Discharge status: Home / Self Care | Payer: Medicare Other | Source: Ambulatory Visit | Attending: Internal Medicine | Admitting: Internal Medicine

## 2020-12-06 DIAGNOSIS — Z01812 Encounter for preprocedural laboratory examination: Secondary | ICD-10-CM | POA: Insufficient documentation

## 2020-12-06 DIAGNOSIS — U071 COVID-19: Secondary | ICD-10-CM | POA: Insufficient documentation

## 2020-12-06 DIAGNOSIS — Z860100 Personal history of colon polyps, unspecified: Secondary | ICD-10-CM

## 2020-12-06 DIAGNOSIS — Z800 Family history of malignant neoplasm of digestive organs: Secondary | ICD-10-CM

## 2020-12-06 LAB — SARS CORONAVIRUS 2 (TAT 6-24 HRS): SARS Coronavirus 2: POSITIVE — AB

## 2020-12-07 ENCOUNTER — Telehealth: Payer: Self-pay

## 2020-12-07 NOTE — Telephone Encounter (Signed)
Called to discuss with patient about COVID-19 symptoms and the use of one of the available treatments for those with mild to moderate Covid symptoms and at a high risk of hospitalization.  Pt may appear to qualify for outpatient treatment due to co-morbid conditions and/or a member of an at-risk group in accordance with the FDA Emergency Use Authorization.      Unable to reach pt - LMOM   Janine Ores, RN

## 2020-12-07 NOTE — Progress Notes (Signed)
Dr. Laural Golden notified of patient being covid positive. Dr. Laural Golden to call patient.

## 2020-12-08 ENCOUNTER — Encounter (HOSPITAL_COMMUNITY): Admission: RE | Payer: Self-pay | Source: Home / Self Care

## 2020-12-08 ENCOUNTER — Ambulatory Visit (HOSPITAL_COMMUNITY): Admission: RE | Admit: 2020-12-08 | Payer: Medicare Other | Source: Home / Self Care | Admitting: Internal Medicine

## 2020-12-08 SURGERY — COLONOSCOPY WITH PROPOFOL
Anesthesia: Monitor Anesthesia Care

## 2020-12-28 DIAGNOSIS — L570 Actinic keratosis: Secondary | ICD-10-CM | POA: Diagnosis not present

## 2020-12-30 DIAGNOSIS — H40013 Open angle with borderline findings, low risk, bilateral: Secondary | ICD-10-CM | POA: Diagnosis not present

## 2020-12-30 DIAGNOSIS — H35363 Drusen (degenerative) of macula, bilateral: Secondary | ICD-10-CM | POA: Diagnosis not present

## 2020-12-30 DIAGNOSIS — H3561 Retinal hemorrhage, right eye: Secondary | ICD-10-CM | POA: Diagnosis not present

## 2020-12-30 DIAGNOSIS — H35372 Puckering of macula, left eye: Secondary | ICD-10-CM | POA: Diagnosis not present

## 2021-02-04 ENCOUNTER — Encounter: Payer: Self-pay | Admitting: Urology

## 2021-02-04 ENCOUNTER — Other Ambulatory Visit: Payer: Self-pay

## 2021-02-04 ENCOUNTER — Ambulatory Visit (INDEPENDENT_AMBULATORY_CARE_PROVIDER_SITE_OTHER): Payer: Medicare Other | Admitting: Urology

## 2021-02-04 VITALS — BP 132/68 | HR 83

## 2021-02-04 DIAGNOSIS — R339 Retention of urine, unspecified: Secondary | ICD-10-CM | POA: Diagnosis not present

## 2021-02-04 DIAGNOSIS — N210 Calculus in bladder: Secondary | ICD-10-CM | POA: Diagnosis not present

## 2021-02-04 DIAGNOSIS — N401 Enlarged prostate with lower urinary tract symptoms: Secondary | ICD-10-CM | POA: Diagnosis not present

## 2021-02-04 DIAGNOSIS — N138 Other obstructive and reflux uropathy: Secondary | ICD-10-CM | POA: Insufficient documentation

## 2021-02-04 LAB — URINALYSIS, ROUTINE W REFLEX MICROSCOPIC
Bilirubin, UA: NEGATIVE
Glucose, UA: NEGATIVE
Ketones, UA: NEGATIVE
Nitrite, UA: NEGATIVE
Protein,UA: NEGATIVE
Specific Gravity, UA: 1.025 (ref 1.005–1.030)
Urobilinogen, Ur: 0.2 mg/dL (ref 0.2–1.0)
pH, UA: 6 (ref 5.0–7.5)

## 2021-02-04 LAB — BLADDER SCAN AMB NON-IMAGING: Scan Result: 7

## 2021-02-04 LAB — MICROSCOPIC EXAMINATION: Renal Epithel, UA: NONE SEEN /hpf

## 2021-02-04 MED ORDER — TAMSULOSIN HCL 0.4 MG PO CAPS: 0.4000 mg | ORAL_CAPSULE | Freq: Every day | ORAL | 3 refills | Status: DC

## 2021-02-04 NOTE — Progress Notes (Signed)
post void residual=7  Urological Symptom Review  Patient is experiencing the following symptoms: Frequent urination Hard to postpone urination Get up at night to urinate Erection problems (male only)   Review of Systems  Gastrointestinal (upper)  : Negative for upper GI symptoms  Gastrointestinal (lower) : Negative for lower GI symptoms  Constitutional : Negative for symptoms  Skin: Negative for skin symptoms  Eyes: Negative for eye symptoms  Ear/Nose/Throat : Negative for Ear/Nose/Throat symptoms  Hematologic/Lymphatic: Easy bruising  Cardiovascular : Negative for cardiovascular symptoms  Respiratory : Negative for respiratory symptoms  Endocrine: Negative for endocrine symptoms  Musculoskeletal: Joint pain  Neurological: Negative for neurological symptoms  Psychologic: Negative for psychiatric symptoms

## 2021-02-04 NOTE — Patient Instructions (Signed)
Benign Prostatic Hyperplasia  Benign prostatic hyperplasia (BPH) is an enlarged prostate gland that is caused by the normal aging process and not by cancer. The prostate is a walnut-sized gland that is involved in the production of semen. It is located in front of the rectum and below the bladder. The bladder stores urine and the urethra is the tube that carries the urine out of the body. The prostate may get bigger asa man gets older. An enlarged prostate can press on the urethra. This can make it harder to pass urine. The build-up of urine in the bladder can cause infection. Back pressure and infection may progress to bladder damage and kidney (renal) failure. What are the causes? This condition is part of a normal aging process. However, not all men develop problems from this condition. If the prostate enlarges away from the urethra, urine flow will not be blocked. If it enlarges toward the urethra andcompresses it, there will be problems passing urine. What increases the risk? This condition is more likely to develop in men over the age of 50 years. What are the signs or symptoms? Symptoms of this condition include: Getting up often during the night to urinate. Needing to urinate frequently during the day. Difficulty starting urine flow. Decrease in size and strength of your urine stream. Leaking (dribbling) after urinating. Inability to pass urine. This needs immediate treatment. Inability to completely empty your bladder. Pain when you pass urine. This is more common if there is also an infection. Urinary tract infection (UTI). How is this diagnosed? This condition is diagnosed based on your medical history, a physical exam, and your symptoms. Tests will also be done, such as: A post-void bladder scan. This measures any amount of urine that may remain in your bladder after you finish urinating. A digital rectal exam. In a rectal exam, your health care provider checks your prostate by  putting a lubricated, gloved finger into your rectum to feel the back of your prostate gland. This exam detects the size of your gland and any abnormal lumps or growths. An exam of your urine (urinalysis). A prostate specific antigen (PSA) screening. This is a blood test used to screen for prostate cancer. An ultrasound. This test uses sound waves to electronically produce a picture of your prostate gland. Your health care provider may refer you to a specialist in kidney and prostate diseases (urologist). How is this treated? Once symptoms begin, your health care provider will monitor your condition (active surveillance or watchful waiting). Treatment for this condition will depend on the severity of your condition. Treatment may include: Observation and yearly exams. This may be the only treatment needed if your condition and symptoms are mild. Medicines to relieve your symptoms, including: Medicines to shrink the prostate. Medicines to relax the muscle of the prostate. Surgery in severe cases. Surgery may include: Prostatectomy. In this procedure, the prostate tissue is removed completely through an open incision or with a laparoscope or robotics. Transurethral resection of the prostate (TURP). In this procedure, a tool is inserted through the opening at the tip of the penis (urethra). It is used to cut away tissue of the inner core of the prostate. The pieces are removed through the same opening of the penis. This removes the blockage. Transurethral incision (TUIP). In this procedure, small cuts are made in the prostate. This lessens the prostate's pressure on the urethra. Transurethral microwave thermotherapy (TUMT). This procedure uses microwaves to create heat. The heat destroys and removes a small   amount of prostate tissue. Transurethral needle ablation (TUNA). This procedure uses radio frequencies to destroy and remove a small amount of prostate tissue. Interstitial laser coagulation (ILC).  This procedure uses a laser to destroy and remove a small amount of prostate tissue. Transurethral electrovaporization (TUVP). This procedure uses electrodes to destroy and remove a small amount of prostate tissue. Prostatic urethral lift. This procedure inserts an implant to push the lobes of the prostate away from the urethra. Follow these instructions at home: Take over-the-counter and prescription medicines only as told by your health care provider. Monitor your symptoms for any changes. Contact your health care provider with any changes. Avoid drinking large amounts of liquid before going to bed or out in public. Avoid or reduce how much caffeine or alcohol you drink. Give yourself time when you urinate. Keep all follow-up visits as told by your health care provider. This is important. Contact a health care provider if: You have unexplained back pain. Your symptoms do not get better with treatment. You develop side effects from the medicine you are taking. Your urine becomes very dark or has a bad smell. Your lower abdomen becomes distended and you have trouble passing your urine. Get help right away if: You have a fever or chills. You suddenly cannot urinate. You feel lightheaded, or very dizzy, or you faint. There are large amounts of blood or clots in the urine. Your urinary problems become hard to manage. You develop moderate to severe low back or flank pain. The flank is the side of your body between the ribs and the hip. These symptoms may represent a serious problem that is an emergency. Do not wait to see if the symptoms will go away. Get medical help right away. Call your local emergency services (911 in the [place] Do not drive yourself to the hospital. Summary Benign prostatic hyperplasia (BPH) is an enlarged prostate that is caused by the normal aging process and not by cancer. An enlarged prostate can press on the urethra. This can make it hard to pass urine. This  condition is part of a normal aging process and is more likely to develop in men over the age of 50 years. Get help right away if you suddenly cannot urinate. This information is not intended to replace advice given to you by your health care provider. Make sure you discuss any questions you have with your healthcare provider. Document Revised: 03/18/2020 Document Reviewed: 03/18/2020 Elsevier Patient Education  2022 [employer] Inc[employer]  

## 2021-02-04 NOTE — Progress Notes (Signed)
02/04/2021 10:30 AM   [name] October 27, 1940 [id number]  Referring provider: Rory Percy, MD [place],  [place] [address]  Followup incomplete emptying   HPI: [name] is a 80yo here for followup for BPH and incomplete emptying. IPSS 11 QOL 2. Nocturia 2-3x. No other complaints today. He remains on flomax 0.4mg  daily   PMH: Past Medical History:  Diagnosis Date   History of colon polyps    removed with colonoscopy   Kidney stone    in past-has one at present, not bothersome   Seizures ([place])    none in 10 yrs- controlled with meds(Dr. Ned Clines [phone])    Surgical History: Past Surgical History:  Procedure Laterality Date   CHOLECYSTECTOMY  07/25/2012   Procedure: LAPAROSCOPIC CHOLECYSTECTOMY WITH INTRAOPERATIVE CHOLANGIOGRAM;  Surgeon: Ralene Ok, MD;  Location: [facility];  Service: General;;   COLONOSCOPY  11/15/2011   Procedure: COLONOSCOPY;  Surgeon: Rogene Houston, MD;  Location: [facility];  Service: Endoscopy;  Laterality: N/A;  730   ELBOW SURGERY     chipped bone right elbow   TONSILLECTOMY     VASECTOMY      Home Medications:  Allergies as of 02/04/2021       Reactions   Sulfa Antibiotics Hives, Rash        Medication List        Accurate as of February 04, 2021 10:30 AM. If you have any questions, ask your nurse or doctor.          aspirin 81 MG tablet Take 81 mg by mouth at bedtime.   Centrum Silver Adult 50+ Tabs Take 1 tablet by mouth daily.   metroNIDAZOLE 1 % gel Commonly known as: METROGEL Apply 1 application topically daily as needed (Rosacea). Apply to face   Oxcarbazepine 300 MG tablet Commonly known as: TRILEPTAL Take 300 mg by mouth 2 (two) times daily.   polyethylene glycol-electrolytes 420 g solution Commonly known as: TriLyte Take 4,000 mLs by mouth as directed.   sodium chloride 0.65 % nasal spray Commonly known as: OCEAN Place 1 spray into the nose daily as needed for congestion.   Synthroid  50 MCG tablet Generic drug: levothyroxine Take 50 mcg by mouth daily.   tamsulosin 0.4 MG Caps capsule Commonly known as: FLOMAX Take 1 capsule (0.4 mg total) by mouth daily. What changed: when to take this   zonisamide 100 MG capsule Commonly known as: ZONEGRAN Take 400 mg by mouth daily.        Allergies:  Allergies  Allergen Reactions   Sulfa Antibiotics Hives and Rash    Family History: No family history on file.  Social History:  reports that he quit smoking about 38 years ago. His smoking use included cigarettes. He smoked an average of .5 packs per day. He has never used smokeless tobacco. He reports current alcohol use of about 1.0 standard drink of alcohol per week. He reports that he does not use drugs.  ROS: All other review of systems were reviewed and are negative except what is noted above in HPI  Physical Exam: BP 132/68   Pulse 83   Constitutional:  Alert and oriented, No acute distress. HEENT: [name] AT, moist mucus membranes.  Trachea midline, no masses. Cardiovascular: No clubbing, cyanosis, or edema. Respiratory: Normal respiratory effort, no increased work of breathing. GI: Abdomen is soft, nontender, nondistended, no abdominal masses GU: No CVA tenderness.  Lymph: No cervical or inguinal lymphadenopathy. Skin: No rashes,  bruises or suspicious lesions. Neurologic: Grossly intact, no focal deficits, moving all 4 extremities. Psychiatric: Normal mood and affect.  Laboratory Data: Lab Results  Component Value Date   WBC 7.7 07/19/2012   HGB 14.3 07/19/2012   HCT 42.6 07/19/2012   MCV 89.5 07/19/2012   PLT 329 07/19/2012    Lab Results  Component Value Date   CREATININE 1.02 07/19/2012    No results found for: PSA  No results found for: TESTOSTERONE  No results found for: HGBA1C  Urinalysis    Component Value Date/Time   APPEARANCEUR Clear 08/06/2020 1104   GLUCOSEU Negative 08/06/2020 1104   BILIRUBINUR Negative 08/06/2020 1104    PROTEINUR Trace (A) 08/06/2020 1104   NITRITE Negative 08/06/2020 1104   LEUKOCYTESUR Trace (A) 08/06/2020 1104    Lab Results  Component Value Date   LABMICR See below: 08/06/2020   WBCUA 6-10 (A) 08/06/2020   LABEPIT None seen 08/06/2020   MUCUS Present 08/06/2020   BACTERIA None seen 08/06/2020    Pertinent Imaging:  No results found for this or any previous visit.  No results found for this or any previous visit.  No results found for this or any previous visit.  No results found for this or any previous visit.  No results found for this or any previous visit.  No results found for this or any previous visit.  No results found for this or any previous visit.  No results found for this or any previous visit.   Assessment & Plan:    1. Incomplete emptying of bladder -Continue flomax 0.4mg  daily - BLADDER SCAN AMB NON-IMAGING - Urinalysis, Routine w reflex microscopic  2. Benign prostatic hyperplasia with urinary obstruction -continue continue flomax   No follow-ups on file.  Nicolette Bang, MD  [facility] [place]

## 2021-03-07 DIAGNOSIS — B351 Tinea unguium: Secondary | ICD-10-CM | POA: Diagnosis not present

## 2021-03-07 DIAGNOSIS — Z6827 Body mass index (BMI) 27.0-27.9, adult: Secondary | ICD-10-CM | POA: Diagnosis not present

## 2021-04-14 DIAGNOSIS — G40109 Localization-related (focal) (partial) symptomatic epilepsy and epileptic syndromes with simple partial seizures, not intractable, without status epilepticus: Secondary | ICD-10-CM | POA: Diagnosis not present

## 2021-04-23 DIAGNOSIS — Z6827 Body mass index (BMI) 27.0-27.9, adult: Secondary | ICD-10-CM | POA: Diagnosis not present

## 2021-04-23 DIAGNOSIS — H9212 Otorrhea, left ear: Secondary | ICD-10-CM | POA: Diagnosis not present

## 2021-04-23 DIAGNOSIS — H6092 Unspecified otitis externa, left ear: Secondary | ICD-10-CM | POA: Diagnosis not present

## 2021-05-17 DIAGNOSIS — Z23 Encounter for immunization: Secondary | ICD-10-CM | POA: Diagnosis not present

## 2021-05-17 DIAGNOSIS — Z20828 Contact with and (suspected) exposure to other viral communicable diseases: Secondary | ICD-10-CM | POA: Diagnosis not present

## 2021-06-13 ENCOUNTER — Encounter (HOSPITAL_COMMUNITY): Payer: Self-pay | Admitting: Emergency Medicine

## 2021-06-13 ENCOUNTER — Other Ambulatory Visit: Payer: Self-pay

## 2021-06-13 ENCOUNTER — Inpatient Hospital Stay (HOSPITAL_COMMUNITY)
Admission: EM | Admit: 2021-06-13 | Discharge: 2021-06-15 | DRG: 696 | Disposition: A | Discharge status: Home / Self Care | Payer: Medicare Other | Attending: Internal Medicine | Admitting: Internal Medicine

## 2021-06-13 ENCOUNTER — Emergency Department (HOSPITAL_COMMUNITY): Payer: Medicare Other

## 2021-06-13 DIAGNOSIS — R569 Unspecified convulsions: Secondary | ICD-10-CM

## 2021-06-13 DIAGNOSIS — R338 Other retention of urine: Secondary | ICD-10-CM | POA: Diagnosis present

## 2021-06-13 DIAGNOSIS — N138 Other obstructive and reflux uropathy: Secondary | ICD-10-CM | POA: Diagnosis not present

## 2021-06-13 DIAGNOSIS — R102 Pelvic and perineal pain: Secondary | ICD-10-CM | POA: Diagnosis present

## 2021-06-13 DIAGNOSIS — R300 Dysuria: Secondary | ICD-10-CM | POA: Diagnosis present

## 2021-06-13 DIAGNOSIS — N401 Enlarged prostate with lower urinary tract symptoms: Secondary | ICD-10-CM

## 2021-06-13 DIAGNOSIS — K4020 Bilateral inguinal hernia, without obstruction or gangrene, not specified as recurrent: Secondary | ICD-10-CM | POA: Diagnosis not present

## 2021-06-13 DIAGNOSIS — G40909 Epilepsy, unspecified, not intractable, without status epilepticus: Secondary | ICD-10-CM | POA: Diagnosis present

## 2021-06-13 DIAGNOSIS — Z79899 Other long term (current) drug therapy: Secondary | ICD-10-CM

## 2021-06-13 DIAGNOSIS — Z20822 Contact with and (suspected) exposure to covid-19: Secondary | ICD-10-CM | POA: Diagnosis present

## 2021-06-13 DIAGNOSIS — M5432 Sciatica, left side: Secondary | ICD-10-CM | POA: Diagnosis not present

## 2021-06-13 DIAGNOSIS — Z9049 Acquired absence of other specified parts of digestive tract: Secondary | ICD-10-CM

## 2021-06-13 DIAGNOSIS — Z79890 Hormone replacement therapy (postmenopausal): Secondary | ICD-10-CM

## 2021-06-13 DIAGNOSIS — Z87891 Personal history of nicotine dependence: Secondary | ICD-10-CM

## 2021-06-13 DIAGNOSIS — N210 Calculus in bladder: Secondary | ICD-10-CM | POA: Diagnosis not present

## 2021-06-13 DIAGNOSIS — E039 Hypothyroidism, unspecified: Secondary | ICD-10-CM

## 2021-06-13 DIAGNOSIS — R319 Hematuria, unspecified: Secondary | ICD-10-CM | POA: Diagnosis present

## 2021-06-13 DIAGNOSIS — N139 Obstructive and reflux uropathy, unspecified: Secondary | ICD-10-CM | POA: Diagnosis not present

## 2021-06-13 DIAGNOSIS — N2889 Other specified disorders of kidney and ureter: Secondary | ICD-10-CM | POA: Diagnosis not present

## 2021-06-13 DIAGNOSIS — N132 Hydronephrosis with renal and ureteral calculous obstruction: Secondary | ICD-10-CM | POA: Diagnosis not present

## 2021-06-13 DIAGNOSIS — N131 Hydronephrosis with ureteral stricture, not elsewhere classified: Secondary | ICD-10-CM | POA: Diagnosis not present

## 2021-06-13 DIAGNOSIS — Z8719 Personal history of other diseases of the digestive system: Secondary | ICD-10-CM

## 2021-06-13 DIAGNOSIS — R3915 Urgency of urination: Secondary | ICD-10-CM | POA: Diagnosis present

## 2021-06-13 DIAGNOSIS — Z87442 Personal history of urinary calculi: Secondary | ICD-10-CM

## 2021-06-13 DIAGNOSIS — N200 Calculus of kidney: Secondary | ICD-10-CM

## 2021-06-13 DIAGNOSIS — R310 Gross hematuria: Secondary | ICD-10-CM | POA: Diagnosis not present

## 2021-06-13 DIAGNOSIS — Z881 Allergy status to other antibiotic agents status: Secondary | ICD-10-CM

## 2021-06-13 DIAGNOSIS — Z7982 Long term (current) use of aspirin: Secondary | ICD-10-CM

## 2021-06-13 DIAGNOSIS — M5430 Sciatica, unspecified side: Secondary | ICD-10-CM

## 2021-06-13 DIAGNOSIS — N400 Enlarged prostate without lower urinary tract symptoms: Secondary | ICD-10-CM | POA: Diagnosis not present

## 2021-06-13 LAB — BASIC METABOLIC PANEL
Anion gap: 6 (ref 5–15)
BUN: 19 mg/dL (ref 8–23)
CO2: 23 mmol/L (ref 22–32)
Calcium: 9.6 mg/dL (ref 8.9–10.3)
Chloride: 106 mmol/L (ref 98–111)
Creatinine, Ser: 1 mg/dL (ref 0.61–1.24)
GFR, Estimated: >60 mL/min (ref >60)
Glucose, Bld: 114 mg/dL — ABNORMAL HIGH (ref 70–99)
Potassium: 3.7 mmol/L (ref 3.5–5.1)
Sodium: 135 mmol/L (ref 135–145)

## 2021-06-13 LAB — CBC WITH DIFFERENTIAL/PLATELET
Abs Immature Granulocytes: 0.03 10*3/uL (ref 0.00–0.07)
Basophils Absolute: 0 10*3/uL (ref 0.0–0.1)
Basophils Relative: 0 %
Eosinophils Absolute: 0 10*3/uL (ref 0.0–0.5)
Eosinophils Relative: 1 %
HCT: 39.4 % (ref 39.0–52.0)
Hemoglobin: 13.6 g/dL (ref 13.0–17.0)
Immature Granulocytes: 1 %
Lymphocytes Relative: 16 %
Lymphs Abs: 0.9 10*3/uL (ref 0.7–4.0)
MCH: 32.2 pg (ref 26.0–34.0)
MCHC: 34.5 g/dL (ref 30.0–36.0)
MCV: 93.1 fL (ref 80.0–100.0)
Monocytes Absolute: 0.4 10*3/uL (ref 0.1–1.0)
Monocytes Relative: 8 %
Neutro Abs: 3.9 10*3/uL (ref 1.7–7.7)
Neutrophils Relative %: 74 %
Platelets: 185 10*3/uL (ref 150–400)
RBC: 4.23 MIL/uL (ref 4.22–5.81)
RDW: 12.3 % (ref 11.5–15.5)
WBC: 5.3 10*3/uL (ref 4.0–10.5)
nRBC: 0 % (ref 0.0–0.2)

## 2021-06-13 LAB — PROTIME-INR
INR: 1.1 (ref 0.8–1.2)
Prothrombin Time: 14.1 seconds (ref 11.4–15.2)

## 2021-06-13 LAB — RESP PANEL BY RT-PCR (FLU A&B, COVID) ARPGX2
Influenza A by PCR: NEGATIVE
Influenza B by PCR: NEGATIVE
SARS Coronavirus 2 by RT PCR: NEGATIVE

## 2021-06-13 MED ORDER — LEVOTHYROXINE SODIUM 50 MCG PO TABS
50.0000 ug | ORAL_TABLET | Freq: Every day | ORAL | Status: DC
  Administered 2021-06-14 – 2021-06-15 (×2): 50 ug via ORAL
  Filled 2021-06-13 (×2): qty 1

## 2021-06-13 MED ORDER — HYDRALAZINE HCL 20 MG/ML IJ SOLN: 2.0000 mg | INTRAMUSCULAR | Status: DC | PRN

## 2021-06-13 MED ORDER — SODIUM CHLORIDE 0.9 % IV SOLN
2.0000 g | Freq: Once | INTRAVENOUS | Status: AC
  Administered 2021-06-13: 2 g via INTRAVENOUS
  Filled 2021-06-13: qty 20

## 2021-06-13 MED ORDER — ACETAMINOPHEN 650 MG RE SUPP: 650.0000 mg | Freq: Four times a day (QID) | RECTAL | Status: DC | PRN

## 2021-06-13 MED ORDER — KETOROLAC TROMETHAMINE 15 MG/ML IJ SOLN
15.0000 mg | Freq: Once | INTRAMUSCULAR | Status: AC
  Administered 2021-06-13: 15 mg via INTRAVENOUS
  Filled 2021-06-13: qty 1

## 2021-06-13 MED ORDER — OXCARBAZEPINE 300 MG PO TABS
300.0000 mg | ORAL_TABLET | Freq: Two times a day (BID) | ORAL | Status: DC
  Administered 2021-06-14 – 2021-06-15 (×3): 300 mg via ORAL
  Filled 2021-06-13 (×4): qty 1

## 2021-06-13 MED ORDER — SALINE SPRAY 0.65 % NA SOLN: 1.0000 | Freq: Every day | NASAL | Status: DC | PRN

## 2021-06-13 MED ORDER — ONDANSETRON HCL 4 MG PO TABS: 4.0000 mg | ORAL_TABLET | Freq: Four times a day (QID) | ORAL | Status: DC | PRN

## 2021-06-13 MED ORDER — KETOROLAC TROMETHAMINE 30 MG/ML IJ SOLN: 30.0000 mg | Freq: Four times a day (QID) | INTRAMUSCULAR | Status: DC | PRN

## 2021-06-13 MED ORDER — ZONISAMIDE 100 MG PO CAPS
400.0000 mg | ORAL_CAPSULE | Freq: Every day | ORAL | Status: DC
  Administered 2021-06-14 – 2021-06-15 (×2): 400 mg via ORAL
  Filled 2021-06-13 (×4): qty 4

## 2021-06-13 MED ORDER — TAMSULOSIN HCL 0.4 MG PO CAPS
0.4000 mg | ORAL_CAPSULE | Freq: Every day | ORAL | Status: DC
  Administered 2021-06-14: 0.4 mg via ORAL
  Filled 2021-06-13 (×2): qty 1

## 2021-06-13 MED ORDER — KETOROLAC TROMETHAMINE 30 MG/ML IJ SOLN
30.0000 mg | Freq: Two times a day (BID) | INTRAMUSCULAR | Status: DC | PRN
  Administered 2021-06-14: 30 mg via INTRAVENOUS
  Filled 2021-06-13: qty 1

## 2021-06-13 MED ORDER — METHOCARBAMOL 1000 MG/10ML IJ SOLN
500.0000 mg | Freq: Four times a day (QID) | INTRAVENOUS | Status: DC | PRN
  Filled 2021-06-13: qty 5

## 2021-06-13 MED ORDER — CIPROFLOXACIN HCL 250 MG PO TABS
500.0000 mg | ORAL_TABLET | Freq: Two times a day (BID) | ORAL | Status: DC
  Administered 2021-06-14 – 2021-06-15 (×3): 500 mg via ORAL
  Filled 2021-06-13 (×3): qty 2

## 2021-06-13 MED ORDER — ACETAMINOPHEN 325 MG PO TABS: 650.0000 mg | ORAL_TABLET | Freq: Four times a day (QID) | ORAL | Status: DC | PRN

## 2021-06-13 MED ORDER — ONDANSETRON HCL 4 MG/2ML IJ SOLN: 4.0000 mg | Freq: Four times a day (QID) | INTRAMUSCULAR | Status: DC | PRN

## 2021-06-13 MED ORDER — SODIUM CHLORIDE 0.9 % IR SOLN
3000.0000 mL | Status: DC
  Administered 2021-06-13: 3000 mL

## 2021-06-13 MED ORDER — HEPARIN SODIUM (PORCINE) 5000 UNIT/ML IJ SOLN
5000.0000 [IU] | Freq: Three times a day (TID) | INTRAMUSCULAR | Status: DC
  Administered 2021-06-14 – 2021-06-15 (×4): 5000 [IU] via SUBCUTANEOUS
  Filled 2021-06-13 (×4): qty 1

## 2021-06-13 NOTE — Consult Note (Signed)
Urology Consult  Referring physician: Dr. Kathrynn Humble Reason for referral: Gross hematuria, difficult foley placement  Chief Complaint: gross hematuria  History of Present Illness: [name] is a 80yo wit a history of nephrolithiasis and BPH who presented to the ER with a 14 hour inability to urinate. He developed gross hematuria Friday with associated urinary urgency, frequency and dysuria. No prior hx of UTI. The hematuria worsened and he then developed an inability to urinate. Multiple attempts were made int he ER by the nursing staff to place a foley which was unsuccessful. He currently has sharp, constant severe suprapubic pain. No other associated symptoms. NO exacerbating/alleviating events.   Past Medical History:  Diagnosis Date   History of colon polyps    removed with colonoscopy   Kidney stone    in past-has one at present, not bothersome   Seizures ([place])    none in 10 yrs- controlled with meds(Dr. Ned Clines [phone])   Past Surgical History:  Procedure Laterality Date   CHOLECYSTECTOMY  07/25/2012   Procedure: LAPAROSCOPIC CHOLECYSTECTOMY WITH INTRAOPERATIVE CHOLANGIOGRAM;  Surgeon: Ralene Ok, MD;  Location: [facility];  Service: General;;   COLONOSCOPY  11/15/2011   Procedure: COLONOSCOPY;  Surgeon: Rogene Houston, MD;  [facility];  Service: Endoscopy;  Laterality: N/A;  730   ELBOW SURGERY     chipped bone right elbow   TONSILLECTOMY     VASECTOMY      Medications: I have reviewed the patient's current medications. Allergies:  Allergies  Allergen Reactions   Sulfa Antibiotics Hives and Rash    History reviewed. No pertinent family history. Social History:  reports that he quit smoking about 38 years ago. His smoking use included cigarettes. He smoked an average of .5 packs per day. He has never used smokeless tobacco. He reports current alcohol use of about 1.0 standard drink per week. He reports that he does not use drugs.  Review of Systems   Genitourinary:  Positive for difficulty urinating, dysuria, frequency, hematuria and urgency.  All other systems reviewed and are negative.  Physical Exam:  Vital signs in last 24 hours: Temp:  [97.8 F (36.6 C)-98.6 F (37 C)] 98.6 F (37 C) (11/21 1737) Pulse Rate:  [50-91] 67 (11/21 1830) Resp:  [13-26] 20 (11/21 1830) BP: (137-193)/(74-148) 137/74 (11/21 1830) SpO2:  [96 %-100 %] 100 % (11/21 1830) Weight:  [77.1 kg] 77.1 kg (11/21 0739) Physical Exam Vitals reviewed.  Constitutional:      Appearance: Normal appearance.  HENT:     Head: Normocephalic and atraumatic.     Nose: Nose normal.     Mouth/Throat:     Mouth: Mucous membranes are dry.  Eyes:     Extraocular Movements: Extraocular movements intact.     Pupils: Pupils are equal, round, and reactive to light.  Cardiovascular:     Rate and Rhythm: Normal rate and regular rhythm.  Pulmonary:     Effort: Pulmonary effort is normal.     Breath sounds: Normal breath sounds.  Abdominal:     General: There is distension.     Palpations: Abdomen is soft.  Genitourinary:    Penis: Uncircumcised.      Testes: Normal.     Epididymis:     Right: Normal.     Left: Normal.  Musculoskeletal:        General: No swelling. Normal range of motion.     Cervical back: Normal range of motion and neck supple.  Skin:  General: Skin is warm and dry.  Neurological:     General: No focal deficit present.     Mental Status: He is alert and oriented to person, place, and time.  Psychiatric:        Mood and Affect: Mood normal.        Behavior: Behavior normal.        Thought Content: Thought content normal.    Laboratory Data:  Results for orders placed or performed during the hospital encounter of 06/13/21 (from the past 72 hour(s))  CBC with Differential     Status: None   Collection Time: 06/13/21  9:11 AM  Result Value Ref Range   WBC 5.3 4.0 - 10.5 K/uL   RBC 4.23 4.22 - 5.81 MIL/uL   Hemoglobin 13.6 13.0 - 17.0 g/dL    HCT 39.4 39.0 - 52.0 %   MCV 93.1 80.0 - 100.0 fL   MCH 32.2 26.0 - 34.0 pg   MCHC 34.5 30.0 - 36.0 g/dL   RDW 12.3 11.5 - 15.5 %   Platelets 185 150 - 400 K/uL   nRBC 0.0 0.0 - 0.2 %   Neutrophils Relative % 74 %   Neutro Abs 3.9 1.7 - 7.7 K/uL   Lymphocytes Relative 16 %   Lymphs Abs 0.9 0.7 - 4.0 K/uL   Monocytes Relative 8 %   Monocytes Absolute 0.4 0.1 - 1.0 K/uL   Eosinophils Relative 1 %   Eosinophils Absolute 0.0 0.0 - 0.5 K/uL   Basophils Relative 0 %   Basophils Absolute 0.0 0.0 - 0.1 K/uL   Immature Granulocytes 1 %   Abs Immature Granulocytes 0.03 0.00 - 0.07 K/uL    Comment: Performed at [facility], [address], [place], [place] [address]  Basic metabolic panel     Status: Abnormal   Collection Time: 06/13/21  9:11 AM  Result Value Ref Range   Sodium 135 135 - 145 mmol/L   Potassium 3.7 3.5 - 5.1 mmol/L   Chloride 106 98 - 111 mmol/L   CO2 23 22 - 32 mmol/L   Glucose, Bld 114 (H) 70 - 99 mg/dL    Comment: Glucose reference range applies only to samples taken after fasting for at least 8 hours.   BUN 19 8 - 23 mg/dL   Creatinine, Ser 1.00 0.61 - 1.24 mg/dL   Calcium 9.6 8.9 - 10.3 mg/dL   GFR, Estimated >60 >60 mL/min    Comment: (NOTE) Calculated using the CKD-EPI Creatinine Equation (2021)    Anion gap 6 5 - 15    Comment: Performed at [facility], [address] [place], [place] [address]  Protime-INR     Status: None   Collection Time: 06/13/21  9:11 AM  Result Value Ref Range   Prothrombin Time 14.1 11.4 - 15.2 seconds   INR 1.1 0.8 - 1.2    Comment: (NOTE) INR goal varies based on device and disease states. Performed at [facility], [address], [place], [place] [address]    No results found for this or any previous visit (from the past 240 hour(s)). Creatinine: Recent Labs    06/13/21 0911  CREATININE 1.00   Baseline Creatinine: 1.0  Foley Catheter [place]--Complex  Patient was prepped and draped in the usual sterile fashion  using betadine solution. 2% viscous lidocaine was inserted per urethra. A 0.038 sensor was advanced per urethra without significant resistance or recoiling. Upon passage through the bladder neck with the wire urine was noted  to emanate from the urethral meatus. A 24 Fr hematuria catheter was placed via the Blitz technique and passed easily with immediate return of >1000 cc of bloody urine without clot. 10 cc sterile water was placed in the balloon port.    Impression/Assessment:  80yo with gross hematuria and clot retention  Plan:  24 French hematuria placed and 50cc of cot irrigated from the bladder. CBI started and foley placed on traction. Urine is now clear on slow drip. Please continue to wean CBI to off. Gross hematuria: I discussed the various causes of gross hematuria with the patient and his etiologies are likely prostatic bleeding versus UTI. Please start rocephin pending urine culture.   Nicolette Bang 06/13/2021, 7:05 PM

## 2021-06-13 NOTE — ED Notes (Addendum)
Three way foley placed during day shift by Cleon Gustin, MD.

## 2021-06-13 NOTE — H&P (Addendum)
History and Physical    [name]:[id number] DOB: 04-16-1941 DOA: 06/13/2021  PCP: [facility] Patient coming from: home  Chief Complaint: inability to urinate  HPI: [name] is a 80 y.o. male with medical history significant of BPH, Szr, Nephrolithiasis.  Pt presenting w/ 14 hr of inability to urinate and suprapubic pain. Pt presented to ED for evaluation. Otherwise in his nml state of health and w/o complaint - frequency, penile discharge, CP, fever, flank pain, n/v.    ED Course: Multiple attempts made to insert catheter w/o success. Urology consulted and were successful in inserting a catheter.   Review of Systems: As per HPI otherwise all other systems reviewed and are negative  Ambulatory Status:no restrictions  Past Medical History:  Diagnosis Date   History of colon polyps    removed with colonoscopy   Kidney stone    in past-has one at present, not bothersome   Seizures ([place])    none in 10 yrs- controlled with meds(Dr. Ned Clines [phone])    Past Surgical History:  Procedure Laterality Date   CHOLECYSTECTOMY  07/25/2012   Procedure: LAPAROSCOPIC CHOLECYSTECTOMY WITH INTRAOPERATIVE CHOLANGIOGRAM;  Surgeon: Ralene Ok, MD;  Location: [facility];  Service: General;;   COLONOSCOPY  11/15/2011   Procedure: COLONOSCOPY;  Surgeon: Rogene Houston, MD;  Location: [facility];  Service: Endoscopy;  Laterality: N/A;  730   ELBOW SURGERY     chipped bone right elbow   TONSILLECTOMY     VASECTOMY      Social History   Socioeconomic History   Marital status: Married    Spouse name: Not on file   Number of children: Not on file   Years of education: Not on file   Highest education level: Not on file  Occupational History   Not on file  Tobacco Use   Smoking status: Former    Packs/day: 0.50    Types: Cigarettes    Quit date: 07/19/1982    Years since quitting: 38.9   Smokeless tobacco: Never  Substance and Sexual Activity    Alcohol use: Yes    Alcohol/week: 1.0 standard drink    Types: 1 Glasses of wine per week    Comment: mixed drink  once in awhile   Drug use: No   Sexual activity: Yes  Other Topics Concern   Not on file  Social History Narrative   Not on file   Social Determinants of Health   Financial Resource Strain: Not on file  Food Insecurity: Not on file  Transportation Needs: Not on file  Physical Activity: Not on file  Stress: Not on file  Social Connections: Not on file  Intimate Partner Violence: Not on file    Allergies  Allergen Reactions   Sulfa Antibiotics Hives and Rash    History reviewed. No pertinent family history.    Prior to Admission medications   Medication Sig Start Date End Date Taking? Authorizing Provider  aspirin 81 MG tablet Take 81 mg by mouth at bedtime.    Yes [provider]  metroNIDAZOLE (METROGEL) 1 % gel Apply 1 application topically daily as needed (Rosacea). Apply to face   Yes [provider]  Multiple Vitamins-Minerals (CENTRUM SILVER ADULT 50+) TABS Take 1 tablet by mouth daily.   Yes [provider]  Oxcarbazepine (TRILEPTAL) 300 MG tablet Take 300 mg by mouth 2 (two) times daily.   Yes [provider]  sodium chloride (OCEAN) 0.65 % nasal spray Place  1 spray into the nose daily as needed for congestion.   Yes [provider]  SYNTHROID 50 MCG tablet Take 50 mcg by mouth daily. 06/08/20  Yes [provider]  tamsulosin (FLOMAX) 0.4 MG CAPS capsule Take 1 capsule (0.4 mg total) by mouth at bedtime. 02/04/21  Yes McKenzie, Candee Furbish, MD  zonisamide (ZONEGRAN) 100 MG capsule Take 400 mg by mouth daily.   Yes [provider]  polyethylene glycol-electrolytes (TRILYTE) 420 g solution Take 4,000 mLs by mouth as directed. Patient not taking: Reported on 02/04/2021 11/29/20   Rogene Houston, MD    Physical Exam: Vitals:   06/13/21 1830 06/13/21 1930 06/13/21 2030 06/13/21 2130  BP: 137/74  120/68 132/85 131/78  Pulse: 67 63 69 65  Resp: 20 18 18 20   Temp:      TempSrc:      SpO2: 100% 98% 99% 100%  Weight:      Height:         General:  Appears calm and comfortable Eyes:  PERRL, EOMI, normal lids, iris ENT:  grossly normal hearing, lips & tongue, mmm Neck:  no LAD, masses or thyromegaly Cardiovascular:  RRR, no m/r/g. No LE edema.  Respiratory:  CTA bilaterally, no w/r/r. Normal respiratory effort. Abdomen:  soft, ntnd, NABS GU: urinary catheter in place.  Skin:  no rash or induration seen on limited exam Musculoskeletal:  grossly normal tone BUE/BLE, good ROM, no bony abnormality Psychiatric:  grossly normal mood and affect, speech fluent and appropriate, AOx3 Neurologic:  CN 2-12 grossly intact, moves all extremities in coordinated fashion, sensation intact  Labs on Admission: I have personally reviewed following labs and imaging studies  CBC: Recent Labs  Lab 06/13/21 0911  WBC 5.3  NEUTROABS 3.9  HGB 13.6  HCT 39.4  MCV 93.1  PLT 130   Basic Metabolic Panel: Recent Labs  Lab 06/13/21 0911  NA 135  K 3.7  CL 106  CO2 23  GLUCOSE 114*  BUN 19  CREATININE 1.00  CALCIUM 9.6   GFR: Estimated Creatinine Clearance: 56.9 mL/min (by C-G formula based on SCr of 1 mg/dL). Liver Function Tests: No results for input(s): AST, ALT, ALKPHOS, BILITOT, PROT, ALBUMIN in the last 168 hours. No results for input(s): LIPASE, AMYLASE in the last 168 hours. No results for input(s): AMMONIA in the last 168 hours. Coagulation Profile: Recent Labs  Lab 06/13/21 0911  INR 1.1   Cardiac Enzymes: No results for input(s): CKTOTAL, CKMB, CKMBINDEX, TROPONINI in the last 168 hours. BNP (last 3 results) No results for input(s): PROBNP in the last 8760 hours. HbA1C: No results for input(s): HGBA1C in the last 72 hours. CBG: No results for input(s): GLUCAP in the last 168 hours. Lipid Profile: No results for input(s): CHOL, HDL, LDLCALC, TRIG, CHOLHDL, LDLDIRECT  in the last 72 hours. Thyroid Function Tests: No results for input(s): TSH, T4TOTAL, FREET4, T3FREE, THYROIDAB in the last 72 hours. Anemia Panel: No results for input(s): VITAMINB12, FOLATE, FERRITIN, TIBC, IRON, RETICCTPCT in the last 72 hours. Urine analysis:    Component Value Date/Time   APPEARANCEUR Clear 02/04/2021 1234   GLUCOSEU Negative 02/04/2021 1234   BILIRUBINUR Negative 02/04/2021 1234   PROTEINUR Negative 02/04/2021 1234   NITRITE Negative 02/04/2021 1234   LEUKOCYTESUR Trace (A) 02/04/2021 1234    Creatinine Clearance: Estimated Creatinine Clearance: 56.9 mL/min (by C-G formula based on SCr of 1 mg/dL).  Sepsis Labs: @LABRCNTIP (procalcitonin:4,lacticidven:4) ) Recent Results (from the past 240 hour(s))  Resp  Panel by RT-PCR (Flu A&B, Covid) Nasopharyngeal Swab     Status: None   Collection Time: 06/13/21  5:45 PM   Specimen: Nasopharyngeal Swab; Nasopharyngeal(NP) swabs in vial transport medium  Result Value Ref Range Status   SARS Coronavirus 2 by RT PCR NEGATIVE NEGATIVE Final    Comment: (NOTE) SARS-CoV-2 target nucleic acids are NOT DETECTED.  The SARS-CoV-2 RNA is generally detectable in upper respiratory specimens during the acute phase of infection. The lowest concentration of SARS-CoV-2 viral copies this assay can detect is 138 copies/mL. A negative result does not preclude SARS-Cov-2 infection and should not be used as the sole basis for treatment or other patient management decisions. A negative result may occur with  improper specimen collection/handling, submission of specimen other than nasopharyngeal swab, presence of viral mutation(s) within the areas targeted by this assay, and inadequate number of viral copies(<138 copies/mL). A negative result must be combined with clinical observations, patient history, and epidemiological information. The expected result is Negative.  Fact Sheet for Patients:   EntrepreneurPulse.com.au  Fact Sheet for Healthcare Providers:  IncredibleEmployment.be  This test is no t yet approved or cleared by the [place] FDA and  has been authorized for detection and/or diagnosis of SARS-CoV-2 by FDA under an Emergency Use Authorization (EUA). This EUA will remain  in effect (meaning this test can be used) for the duration of the COVID-19 declaration under Section 564(b)(1) of the Act, 21 U.S.C.section 360bbb-3(b)(1), unless the authorization is terminated  or revoked sooner.       Influenza A by PCR NEGATIVE NEGATIVE Final   Influenza B by PCR NEGATIVE NEGATIVE Final    Comment: (NOTE) The Xpert Xpress SARS-CoV-2/FLU/RSV plus assay is intended as an aid in the diagnosis of influenza from Nasopharyngeal swab specimens and should not be used as a sole basis for treatment. Nasal washings and aspirates are unacceptable for Xpert Xpress SARS-CoV-2/FLU/RSV testing.  Fact Sheet for Patients: EntrepreneurPulse.com.au  Fact Sheet for Healthcare Providers: IncredibleEmployment.be  This test is not yet approved or cleared by the [place] FDA and has been authorized for detection and/or diagnosis of SARS-CoV-2 by FDA under an Emergency Use Authorization (EUA). This EUA will remain in effect (meaning this test can be used) for the duration of the COVID-19 declaration under Section 564(b)(1) of the Act, 21 U.S.C. section 360bbb-3(b)(1), unless the authorization is terminated or revoked.  Performed at [facility], [address], [place], [place] [address]      Radiological Exams on Admission: CT Renal Stone Study  Result Date: 06/13/2021 CLINICAL DATA:  Gross hematuria. EXAM: CT ABDOMEN AND PELVIS WITHOUT CONTRAST TECHNIQUE: Multidetector CT imaging of the abdomen and pelvis was performed following the standard protocol without IV contrast. COMPARISON:  May 11, 2020.  FINDINGS: Lower chest: No acute abnormality. Hepatobiliary: No focal liver abnormality is seen. Status post cholecystectomy. No biliary dilatation. Pancreas: Unremarkable. No pancreatic ductal dilatation or surrounding inflammatory changes. Spleen: Normal in size without focal abnormality. Adrenals/Urinary Tract: Adrenal glands appear normal. Nonobstructive left nephrolithiasis is noted. Minimal bilateral hydroureteronephrosis is noted without evidence of obstructing calculus. Multiple layering calculi are noted posteriorly in the urinary bladder. Foley catheter appears to be inflated within the penile portion of the urethra. Stomach/Bowel: Stomach is within normal limits. Appendix appears normal. No evidence of bowel wall thickening, distention, or inflammatory changes. Vascular/Lymphatic: Aortic atherosclerosis. No enlarged abdominal or pelvic lymph nodes. Reproductive: Stable mild prostatic enlargement is noted. Prostatic calcifications are noted. Other: Small bilateral fat containing inguinal  hernias are noted. Stable small periumbilical hernia is noted which contains a loop of small bowel, but does not result in obstruction. Musculoskeletal: No acute or significant osseous findings. IMPRESSION: Foley catheter appears to be inflated within the penile urethra; repositioning is recommended. Critical Value/emergent results were called by telephone at the time of interpretation on 06/13/2021 at 2:41 pm to provider [facility] , who verbally acknowledged these results. Nonobstructive left nephrolithiasis. Mild bilateral hydroureteronephrosis is noted without evidence of obstructing ureteral calculus. Mild layering calculi are noted posteriorly in the dependent portion of the urinary bladder. Stable mild prostatic enlargement. Aortic Atherosclerosis (ICD10-I70.0). Electronically Signed   By: Marijo Conception M.D.   On: 06/13/2021 14:42      Assessment/Plan Principal Problem:   Hematuria Active Problems:    Bladder calculi   Benign prostatic hyperplasia with urinary obstruction   Seizure ([place])   Nephrolithiasis   Urinary obstruction   Hypothyroidism   Urinary retention: Likely secondary to BPH vs urethral stricture. Urology consulted w/ successful insertion of 24 Fr catheter.  - Mgt per Urology - Cipro while catheter is in  Nephrolithiasis: Non-obstructive stones noted on CT.  - no active mgt needed.   BPH: chronic issue. 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 Likely to go home w/ catheter  Consults called: urology  Admission status: obs.     Waldemar Dickens MD [facility]  If 7PM-7AM, please contact night-coverage [email or web address] Password [facility]  06/13/2021, 10:06 PM

## 2021-06-13 NOTE — ED Notes (Signed)
Dr at bedside talking with pt about how he feels like he still cant urinate. Dr teaching about catheter use.

## 2021-06-13 NOTE — ED Notes (Signed)
Pt put in gown and 12 lead monitor 

## 2021-06-13 NOTE — ED Notes (Signed)
Irrigation still in process.

## 2021-06-13 NOTE — ED Provider Notes (Addendum)
[facility] EMERGENCY DEPARTMENT Provider Note   CSN: [id number] Arrival date & time: 06/13/21  1093     History Chief Complaint  Patient presents with   Hematuria    [name] is a 80 y.o. male.  HPI     80 year old male comes in with chief complaint of hematuria. Patient has history of gross hematuria, bladder calculi.  He reports that this current hematuria started 2 days ago.  The symptoms have progressed and he is having large clots come out.  He has not been able to void since last night.  He does feel bloated.  In the past, he has not required Foley catheters.  Patient denies any burning with urination, urinary frequency prior to the onset of symptoms.  He continues to have no fevers or chills.  Patient takes daily aspirin.  He is not on any blood thinners.  Past Medical History:  Diagnosis Date   History of colon polyps    removed with colonoscopy   Kidney stone    in past-has one at present, not bothersome   Seizures ([place])    none in 10 yrs- controlled with meds(Dr. Ned Clines [phone])    Patient Active Problem List   Diagnosis Date Noted   Benign prostatic hyperplasia with urinary obstruction 02/04/2021   Bladder calculi 08/17/2020   Gross hematuria 08/06/2020   Status post laparoscopic cholecystectomy 09/12/2012    Past Surgical History:  Procedure Laterality Date   CHOLECYSTECTOMY  07/25/2012   Procedure: LAPAROSCOPIC CHOLECYSTECTOMY WITH INTRAOPERATIVE CHOLANGIOGRAM;  Surgeon: Ralene Ok, MD;  Location: [facility];  Service: General;;   COLONOSCOPY  11/15/2011   Procedure: COLONOSCOPY;  Surgeon: Rogene Houston, MD;  [facility];  Service: Endoscopy;  Laterality: N/A;  730   ELBOW SURGERY     chipped bone right elbow   TONSILLECTOMY     VASECTOMY         History reviewed. No pertinent family history.  Social History   Tobacco Use   Smoking status: Former    Packs/day: 0.50    Types: Cigarettes    Quit date: 07/19/1982     Years since quitting: 38.9   Smokeless tobacco: Never  Substance Use Topics   Alcohol use: Yes    Alcohol/week: 1.0 standard drink    Types: 1 Glasses of wine per week    Comment: mixed drink  once in awhile   Drug use: No    Home Medications Prior to Admission medications   Medication Sig Start Date End Date Taking? Authorizing Provider  aspirin 81 MG tablet Take 81 mg by mouth at bedtime.    Yes [provider]  metroNIDAZOLE (METROGEL) 1 % gel Apply 1 application topically daily as needed (Rosacea). Apply to face   Yes [provider]  Multiple Vitamins-Minerals (CENTRUM SILVER ADULT 50+) TABS Take 1 tablet by mouth daily.   Yes [provider]  Oxcarbazepine (TRILEPTAL) 300 MG tablet Take 300 mg by mouth 2 (two) times daily.   Yes [provider]  sodium chloride (OCEAN) 0.65 % nasal spray Place 1 spray into the nose daily as needed for congestion.   Yes [provider]  SYNTHROID 50 MCG tablet Take 50 mcg by mouth daily. 06/08/20  Yes [provider]  tamsulosin (FLOMAX) 0.4 MG CAPS capsule Take 1 capsule (0.4 mg total) by mouth at bedtime. 02/04/21  Yes McKenzie, Candee Furbish, MD  zonisamide (ZONEGRAN) 100 MG capsule Take 400 mg by mouth daily.  Yes [provider]  polyethylene glycol-electrolytes (TRILYTE) 420 g solution Take 4,000 mLs by mouth as directed. Patient not taking: Reported on 02/04/2021 11/29/20   Rogene Houston, MD    Allergies    Sulfa antibiotics  Review of Systems   Review of Systems  Constitutional:  Positive for activity change.  Gastrointestinal:  Negative for abdominal pain.  Genitourinary:  Positive for hematuria.  Hematological:  Does not bruise/bleed easily.  All other systems reviewed and are negative.  Physical Exam Updated Vital Signs BP (!) 164/90   Pulse 71   Temp 97.8 F (36.6 C) (Oral)   Resp 19   Ht 5\' 7"  (1.702 m)   Wt 77.1 kg   SpO2 100%   BMI 26.63 kg/m   Physical  Exam Vitals and nursing note reviewed.  Constitutional:      Appearance: He is well-developed.  HENT:     Head: Atraumatic.  Cardiovascular:     Rate and Rhythm: Normal rate.  Pulmonary:     Effort: Pulmonary effort is normal.  Abdominal:     Tenderness: There is no abdominal tenderness.  Musculoskeletal:     Cervical back: Neck supple.  Skin:    General: Skin is warm.  Neurological:     Mental Status: He is alert and oriented to person, place, and time.    ED Results / Procedures / Treatments   Labs (all labs ordered are listed, but only abnormal results are displayed) Labs Reviewed  BASIC METABOLIC PANEL - Abnormal; Notable for the following components:      Result Value   Glucose, Bld 114 (*)    All other components within normal limits  CBC WITH DIFFERENTIAL/PLATELET  PROTIME-INR  URINALYSIS, ROUTINE W REFLEX MICROSCOPIC    EKG None  Radiology CT Renal Stone Study  Result Date: 06/13/2021 CLINICAL DATA:  Gross hematuria. EXAM: CT ABDOMEN AND PELVIS WITHOUT CONTRAST TECHNIQUE: Multidetector CT imaging of the abdomen and pelvis was performed following the standard protocol without IV contrast. COMPARISON:  May 11, 2020. FINDINGS: Lower chest: No acute abnormality. Hepatobiliary: No focal liver abnormality is seen. Status post cholecystectomy. No biliary dilatation. Pancreas: Unremarkable. No pancreatic ductal dilatation or surrounding inflammatory changes. Spleen: Normal in size without focal abnormality. Adrenals/Urinary Tract: Adrenal glands appear normal. Nonobstructive left nephrolithiasis is noted. Minimal bilateral hydroureteronephrosis is noted without evidence of obstructing calculus. Multiple layering calculi are noted posteriorly in the urinary bladder. Foley catheter appears to be inflated within the penile portion of the urethra. Stomach/Bowel: Stomach is within normal limits. Appendix appears normal. No evidence of bowel wall thickening, distention, or  inflammatory changes. Vascular/Lymphatic: Aortic atherosclerosis. No enlarged abdominal or pelvic lymph nodes. Reproductive: Stable mild prostatic enlargement is noted. Prostatic calcifications are noted. 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 Stable small periumbilical hernia is noted which contains a loop of small bowel, but does not result in obstruction. Musculoskeletal: No acute or significant osseous findings. IMPRESSION: Foley catheter appears to be inflated within the penile urethra; repositioning is recommended. Critical Value/emergent results were called by telephone at the time of interpretation on 06/13/2021 at 2:41 pm to provider [facility] , who verbally acknowledged these results. Nonobstructive left nephrolithiasis. Mild bilateral hydroureteronephrosis is noted without evidence of obstructing ureteral calculus. Mild layering calculi are noted posteriorly in the dependent portion of the urinary bladder. Stable mild prostatic enlargement. Aortic Atherosclerosis (ICD10-I70.0). Electronically Signed   By: Marijo Conception M.D.   On: 06/13/2021 14:42  Procedures Procedures   Medications Ordered in ED Medications  ketorolac (TORADOL) 15 MG/ML injection 15 mg (15 mg Intravenous Given 06/13/21 1227)    ED Course  I have reviewed the triage vital signs and the nursing notes.  Pertinent labs & imaging results that were available during my care of the patient were reviewed by me and considered in my medical decision making (see chart for details).  Clinical Course as of 06/13/21 1619  Mon Jun 13, 2021  1610 HCT: 39.4 Ct scan is concerning for abnormal placement. Nurse made aware. She has tried to manipulate. PT still uncomfortable when she tries to blow up the balloon. Spoke w/ Urology, they will see. [AN]    Clinical Course User Index [AN] Varney Biles, MD   MDM Rules/Calculators/A&P                           80 year old male comes in with chief  complaint of bloody urine, difficulty voiding.  Symptoms started about 2 or 3 days ago.  He has history of gross hematuria that did not require Foley catheter in the past.  He has history of kidney stones that caused hematuria in the past.  History is not suggestive of underlying infection at this time.  He is pain-free, therefore doubt clinically significant kidney stone.  Hematuria however is more profound than usual and is having some urinary retention.  Bladder scan revealed 280 cc.  We will place a three-way Foley catheter and irrigate.  UA will be sent to evaluate for infection.  Patient does not smoke, there is no history of cancer. Not on blood thinners.   Final Clinical Impression(s) / ED Diagnoses Final diagnoses:  Gross hematuria    Rx / DC Orders ED Discharge Orders     None        Varney Biles, MD 06/13/21 [place], Amiyrah Lamere, MD 06/13/21 1620

## 2021-06-13 NOTE — ED Notes (Signed)
Attempted irrigation again and readjustment of cath. More blood clots noted and pt verbalized some relief.

## 2021-06-13 NOTE — ED Notes (Signed)
Pt standing at bedside attempting to relief pressure.

## 2021-06-13 NOTE — ED Triage Notes (Signed)
Pt states he has been unable to void since last night. Pt is passing blood clots only. Hx of the same.

## 2021-06-14 DIAGNOSIS — N020 Recurrent and persistent hematuria with minor glomerular abnormality: Secondary | ICD-10-CM | POA: Diagnosis not present

## 2021-06-14 DIAGNOSIS — R310 Gross hematuria: Secondary | ICD-10-CM | POA: Diagnosis not present

## 2021-06-14 DIAGNOSIS — N138 Other obstructive and reflux uropathy: Secondary | ICD-10-CM | POA: Diagnosis not present

## 2021-06-14 DIAGNOSIS — N210 Calculus in bladder: Secondary | ICD-10-CM | POA: Diagnosis not present

## 2021-06-14 DIAGNOSIS — N401 Enlarged prostate with lower urinary tract symptoms: Secondary | ICD-10-CM | POA: Diagnosis not present

## 2021-06-14 LAB — URINALYSIS, ROUTINE W REFLEX MICROSCOPIC
Bacteria, UA: NONE SEEN
Bilirubin Urine: NEGATIVE
Glucose, UA: NEGATIVE mg/dL
Ketones, ur: NEGATIVE mg/dL
Nitrite: NEGATIVE
Protein, ur: 100 mg/dL — AB
RBC / HPF: >50 RBC/hpf — ABNORMAL HIGH (ref 0–5)
Specific Gravity, Urine: 1.013 (ref 1.005–1.030)
WBC, UA: >50 WBC/hpf — ABNORMAL HIGH (ref 0–5)
pH: 7 (ref 5.0–8.0)

## 2021-06-14 LAB — CBC
HCT: 35.9 % — ABNORMAL LOW (ref 39.0–52.0)
Hemoglobin: 12 g/dL — ABNORMAL LOW (ref 13.0–17.0)
MCH: 31.3 pg (ref 26.0–34.0)
MCHC: 33.4 g/dL (ref 30.0–36.0)
MCV: 93.7 fL (ref 80.0–100.0)
Platelets: 182 10*3/uL (ref 150–400)
RBC: 3.83 MIL/uL — ABNORMAL LOW (ref 4.22–5.81)
RDW: 12.2 % (ref 11.5–15.5)
WBC: 7.8 10*3/uL (ref 4.0–10.5)
nRBC: 0 % (ref 0.0–0.2)

## 2021-06-14 LAB — GLUCOSE, CAPILLARY: Glucose-Capillary: 107 mg/dL — ABNORMAL HIGH (ref 70–99)

## 2021-06-14 LAB — COMPREHENSIVE METABOLIC PANEL
ALT: 15 U/L (ref 0–44)
AST: 20 U/L (ref 15–41)
Albumin: 3.7 g/dL (ref 3.5–5.0)
Alkaline Phosphatase: 63 U/L (ref 38–126)
Anion gap: 6 (ref 5–15)
BUN: 18 mg/dL (ref 8–23)
CO2: 24 mmol/L (ref 22–32)
Calcium: 9.3 mg/dL (ref 8.9–10.3)
Chloride: 106 mmol/L (ref 98–111)
Creatinine, Ser: 0.97 mg/dL (ref 0.61–1.24)
GFR, Estimated: >60 mL/min (ref >60)
Glucose, Bld: 110 mg/dL — ABNORMAL HIGH (ref 70–99)
Potassium: 4 mmol/L (ref 3.5–5.1)
Sodium: 136 mmol/L (ref 135–145)
Total Bilirubin: 1.1 mg/dL (ref 0.3–1.2)
Total Protein: 6.1 g/dL — ABNORMAL LOW (ref 6.5–8.1)

## 2021-06-14 MED ORDER — SODIUM CHLORIDE 0.9 % IV SOLN: 1.0000 g | INTRAVENOUS | Status: DC

## 2021-06-14 MED ORDER — CHLORHEXIDINE GLUCONATE CLOTH 2 % EX PADS
6.0000 | MEDICATED_PAD | Freq: Every day | CUTANEOUS | Status: DC
  Administered 2021-06-14: 6 via TOPICAL

## 2021-06-14 MED ORDER — LACTATED RINGERS IV SOLN: INTRAVENOUS | Status: DC

## 2021-06-14 NOTE — Plan of Care (Signed)
  Problem: Elimination: Goal: Will not experience complications related to urinary retention Outcome: Progressing   Problem: Pain Managment: Goal: General experience of comfort will improve Outcome: Progressing   Problem: Safety: Goal: Ability to remain free from injury will improve Outcome: Progressing   

## 2021-06-14 NOTE — Plan of Care (Signed)
  Problem: Education: Goal: Knowledge of General Education information will improve Description: Including pain rating scale, medication(s)/side effects and non-pharmacologic comfort measures Outcome: Progressing   Problem: Activity: Goal: Risk for activity intolerance will decrease Outcome: Progressing   

## 2021-06-14 NOTE — Progress Notes (Signed)
[facility]                                                                              Patient Demographics  [name], is a 80 y.o. male, DOB - [birth year], UGQ:[id number]  Admit date - 06/13/2021   Admitting Physician Waldemar Dickens, MD  Outpatient Primary MD for the patient is [facility], [facility]  Outpatient specialists:   LOS - 0  days   Medical records reviewed and are as summarized below:    Chief Complaint  Patient presents with   Hematuria       Brief summary   Patient is a 80 year old male with BPH, seizure disorder, nephrolithiasis presented with 14 hours of inability to urinate and suprapubic pain.   He had developed gross hematuria with associated urinary urgency frequency and dysuria on Friday, 06/10/2021.  Hematuria continued to get worse with clots and he developed inability to urinate and severe suprapubic pain. In ED, multiple attempts were made to insert catheter without success.  Urology was consulted and 24 French catheter was placed with 50 cc of clots irrigated from the bladder.  CBI was started and Foley placed.  Assessment & Plan    Principal Problem: Acute hematuria with urinary retention in the setting of BPH -Appreciate urology admission, Foley catheter placed, hematuria clearing, not fully resolved yet -H&H currently stable,  hold aspirin -Continue Flomax -Placed on gentle IV fluid hydration -UA and culture obtained, large hemoglobin with moderate leukocytes, no bacteria -Patient was placed on ciprofloxacin, will continue, follow urology recommendations  Active Problems:   Bladder calculi -CT Danson study showed improved Foley catheter, nonobstructive left nephrolithiasis.  Mild bilateral hydroureteronephrosis.  Mild layering calculi bladder - Per urology    Benign prostatic hyperplasia with urinary obstruction -Continue Flomax  History of seizure (HCC) -Continue zonisamide, Trileptal     Hypothyroidism Continue Synthroid  Code Status: Full CODE STATUS DVT Prophylaxis:  heparin injection 5,000 Units Start: 06/14/21 0600   Level of Care: Level of care: Med-Surg Family Communication: Discussed all imaging results, lab results, explained to the patient daughter at the bedside   Disposition Plan:     Status is: Observation  The patient remains OBS appropriate and will d/c before 2 midnights.     Time Spent in minutes 35 mins   Procedures:  Urinary catheterization  Consultants:   Urology  Antimicrobials:   Anti-infectives (From admission, onward)    Start     Dose/Rate Route Frequency Ordered Stop   06/14/21 1700  cefTRIAXone (ROCEPHIN) 1 g in sodium chloride 0.9 % 100 mL IVPB  Status:  Discontinued        1 g 200 mL/hr over 30 Minutes Intravenous Every 24 hours 06/14/21 0615 06/14/21 0910   06/14/21 0800  ciprofloxacin (CIPRO) tablet 500 mg        500 mg Oral 2 times daily 06/13/21 2250     06/13/21 1645  cefTRIAXone (ROCEPHIN) 2 g in sodium chloride 0.9 % 100 mL IVPB        2 g 200 mL/hr over 30 Minutes Intravenous  Once 06/13/21 1635 06/13/21 1911  Medications  Scheduled Meds:  Chlorhexidine Gluconate Cloth  6 each Topical Daily   ciprofloxacin  500 mg Oral BID   heparin  5,000 Units Subcutaneous Q8H   levothyroxine  50 mcg Oral Q0600   Oxcarbazepine  300 mg Oral BID   tamsulosin  0.4 mg Oral QHS   zonisamide  400 mg Oral Daily   Continuous Infusions:  lactated ringers     methocarbamol (ROBAXIN) IV     sodium chloride irrigation Stopped (06/13/21 2325)   PRN Meds:.acetaminophen **OR** acetaminophen, hydrALAZINE, ketorolac, methocarbamol (ROBAXIN) IV, ondansetron **OR** ondansetron (ZOFRAN) IV, sodium chloride      Subjective:   Findley Brazil was seen and examined today.  Feeling a whole lot better after Foley catheter was placed.  Hematuria clearing up.  Abdominal pain has improved.  No fevers or chills.  No dysuria.     Objective:   Vitals:   06/14/21 0000 06/14/21 0023 06/14/21 0105 06/14/21 0400  BP:  (!) 157/89 (!) 143/79 121/72  Pulse:  74 65 64  Resp:  18 18 18   Temp:  98.1 F (36.7 C) 97.9 F (36.6 C) 98 F (36.7 C)  TempSrc:  Oral Oral Oral  SpO2:  100% 99% 99%  Weight: 75.6 kg     Height:        Intake/Output Summary (Last 24 hours) at 06/14/2021 1138 Last data filed at 06/14/2021 0900 Gross per 24 hour  Intake 480 ml  Output 6850 ml  Net -6370 ml     Wt Readings from Last 3 Encounters:  06/14/21 75.6 kg  12/06/20 77.1 kg  08/06/20 74.4 kg     Exam General: Alert and oriented x 3, NAD Cardiovascular: S1 S2 auscultated, no murmurs, RRR Respiratory: Clear to auscultation bilaterally, no wheezing, rales or rhonchi Gastrointestinal: Soft, nontender, nondistended, + bowel sounds Ext: no pedal edema bilaterally Neuro: no new deficits GU: + Foley, hematuria clearing   Data Reviewed:  I have personally reviewed following labs and imaging studies  Micro Results Recent Results (from the past 240 hour(s))  Resp Panel by RT-PCR (Flu A&B, Covid) Nasopharyngeal Swab     Status: None   Collection Time: 06/13/21  5:45 PM   Specimen: Nasopharyngeal Swab; Nasopharyngeal(NP) swabs in vial transport medium  Result Value Ref Range Status   SARS Coronavirus 2 by RT PCR NEGATIVE NEGATIVE Final    Comment: (NOTE) SARS-CoV-2 target nucleic acids are NOT DETECTED.  The SARS-CoV-2 RNA is generally detectable in upper respiratory specimens during the acute phase of infection. The lowest concentration of SARS-CoV-2 viral copies this assay can detect is 138 copies/mL. A negative result does not preclude SARS-Cov-2 infection and should not be used as the sole basis for treatment or other patient management decisions. A negative result may occur with  improper specimen collection/handling, submission of specimen other than nasopharyngeal swab, presence of viral mutation(s) within the areas  targeted by this assay, and inadequate number of viral copies(<138 copies/mL). A negative result must be combined with clinical observations, patient history, and epidemiological information. The expected result is Negative.  Fact Sheet for Patients:  EntrepreneurPulse.com.au  Fact Sheet for Healthcare Providers:  IncredibleEmployment.be  This test is no t yet approved or cleared by the [place] FDA and  has been authorized for detection and/or diagnosis of SARS-CoV-2 by FDA under an Emergency Use Authorization (EUA). This EUA will remain  in effect (meaning this test can be used) for the duration of the COVID-19 declaration under Section 564(b)(1) of  the Act, 21 U.S.C.section 360bbb-3(b)(1), unless the authorization is terminated  or revoked sooner.       Influenza A by PCR NEGATIVE NEGATIVE Final   Influenza B by PCR NEGATIVE NEGATIVE Final    Comment: (NOTE) The Xpert Xpress SARS-CoV-2/FLU/RSV plus assay is intended as an aid in the diagnosis of influenza from Nasopharyngeal swab specimens and should not be used as a sole basis for treatment. Nasal washings and aspirates are unacceptable for Xpert Xpress SARS-CoV-2/FLU/RSV testing.  Fact Sheet for Patients: EntrepreneurPulse.com.au  Fact Sheet for Healthcare Providers: IncredibleEmployment.be  This test is not yet approved or cleared by the [place] FDA and has been authorized for detection and/or diagnosis of SARS-CoV-2 by FDA under an Emergency Use Authorization (EUA). This EUA will remain in effect (meaning this test can be used) for the duration of the COVID-19 declaration under Section 564(b)(1) of the Act, 21 U.S.C. section 360bbb-3(b)(1), unless the authorization is terminated or revoked.  Performed at [facility], [address], [place], [place] [address]     Radiology Reports CT Renal Stone Study  Result Date:  06/13/2021 CLINICAL DATA:  Gross hematuria. EXAM: CT ABDOMEN AND PELVIS WITHOUT CONTRAST TECHNIQUE: Multidetector CT imaging of the abdomen and pelvis was performed following the standard protocol without IV contrast. COMPARISON:  May 11, 2020. FINDINGS: Lower chest: No acute abnormality. Hepatobiliary: No focal liver abnormality is seen. Status post cholecystectomy. No biliary dilatation. Pancreas: Unremarkable. No pancreatic ductal dilatation or surrounding inflammatory changes. Spleen: Normal in size without focal abnormality. Adrenals/Urinary Tract: Adrenal glands appear normal. Nonobstructive left nephrolithiasis is noted. Minimal bilateral hydroureteronephrosis is noted without evidence of obstructing calculus. Multiple layering calculi are noted posteriorly in the urinary bladder. Foley catheter appears to be inflated within the penile portion of the urethra. Stomach/Bowel: Stomach is within normal limits. Appendix appears normal. No evidence of bowel wall thickening, distention, or inflammatory changes. Vascular/Lymphatic: Aortic atherosclerosis. No enlarged abdominal or pelvic lymph nodes. Reproductive: Stable mild prostatic enlargement is noted. Prostatic calcifications are noted. 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 Stable small periumbilical hernia is noted which contains a loop of small bowel, but does not result in obstruction. Musculoskeletal: No acute or significant osseous findings. IMPRESSION: Foley catheter appears to be inflated within the penile urethra; repositioning is recommended. Critical Value/emergent results were called by telephone at the time of interpretation on 06/13/2021 at 2:41 pm to provider [facility] , who verbally acknowledged these results. Nonobstructive left nephrolithiasis. Mild bilateral hydroureteronephrosis is noted without evidence of obstructing ureteral calculus. Mild layering calculi are noted posteriorly in the dependent portion  of the urinary bladder. Stable mild prostatic enlargement. Aortic Atherosclerosis (ICD10-I70.0). Electronically Signed   By: Marijo Conception M.D.   On: 06/13/2021 14:42    Lab Data:  CBC: Recent Labs  Lab 06/13/21 0911 06/14/21 0630  WBC 5.3 7.8  NEUTROABS 3.9  --   HGB 13.6 12.0*  HCT 39.4 35.9*  MCV 93.1 93.7  PLT 185 335   Basic Metabolic Panel: Recent Labs  Lab 06/13/21 0911 06/14/21 0630  NA 135 136  K 3.7 4.0  CL 106 106  CO2 23 24  GLUCOSE 114* 110*  BUN 19 18  CREATININE 1.00 0.97  CALCIUM 9.6 9.3   GFR: Estimated Creatinine Clearance: 58.7 mL/min (by C-G formula based on SCr of 0.97 mg/dL). Liver Function Tests: Recent Labs  Lab 06/14/21 0630  AST 20  ALT 15  ALKPHOS 63  BILITOT 1.1  PROT 6.1*  ALBUMIN 3.7   No results for input(s): LIPASE, AMYLASE in the last 168 hours. No results for input(s): AMMONIA in the last 168 hours. Coagulation Profile: Recent Labs  Lab 06/13/21 0911  INR 1.1   Cardiac Enzymes: No results for input(s): CKTOTAL, CKMB, CKMBINDEX, TROPONINI in the last 168 hours. BNP (last 3 results) No results for input(s): PROBNP in the last 8760 hours. HbA1C: No results for input(s): HGBA1C in the last 72 hours. CBG: No results for input(s): GLUCAP in the last 168 hours. Lipid Profile: No results for input(s): CHOL, HDL, LDLCALC, TRIG, CHOLHDL, LDLDIRECT in the last 72 hours. Thyroid Function Tests: No results for input(s): TSH, T4TOTAL, FREET4, T3FREE, THYROIDAB in the last 72 hours. Anemia Panel: No results for input(s): VITAMINB12, FOLATE, FERRITIN, TIBC, IRON, RETICCTPCT in the last 72 hours. Urine analysis:    Component Value Date/Time   COLORURINE AMBER (A) 06/14/2021 0640   APPEARANCEUR CLOUDY (A) 06/14/2021 0640   APPEARANCEUR Clear 02/04/2021 1234   LABSPEC 1.013 06/14/2021 0640   PHURINE 7.0 06/14/2021 0640   GLUCOSEU NEGATIVE 06/14/2021 0640   HGBUR LARGE (A) 06/14/2021 0640   BILIRUBINUR NEGATIVE 06/14/2021 0640    BILIRUBINUR Negative 02/04/2021 [place] 06/14/2021 0640   PROTEINUR 100 (A) 06/14/2021 0640   NITRITE NEGATIVE 06/14/2021 0640   LEUKOCYTESUR MODERATE (A) 06/14/2021 0640     Jolean Madariaga M.D. [facility] 06/14/2021, 11:38 AM  Available via Epic secure chat 7am-7pm After 7 pm, please refer to night coverage provider listed on amion.

## 2021-06-15 DIAGNOSIS — Z7982 Long term (current) use of aspirin: Secondary | ICD-10-CM | POA: Diagnosis not present

## 2021-06-15 DIAGNOSIS — N131 Hydronephrosis with ureteral stricture, not elsewhere classified: Secondary | ICD-10-CM | POA: Diagnosis present

## 2021-06-15 DIAGNOSIS — Z8719 Personal history of other diseases of the digestive system: Secondary | ICD-10-CM | POA: Diagnosis not present

## 2021-06-15 DIAGNOSIS — Z20822 Contact with and (suspected) exposure to covid-19: Secondary | ICD-10-CM | POA: Diagnosis present

## 2021-06-15 DIAGNOSIS — N210 Calculus in bladder: Secondary | ICD-10-CM | POA: Diagnosis present

## 2021-06-15 DIAGNOSIS — G40909 Epilepsy, unspecified, not intractable, without status epilepticus: Secondary | ICD-10-CM | POA: Diagnosis present

## 2021-06-15 DIAGNOSIS — N200 Calculus of kidney: Secondary | ICD-10-CM | POA: Diagnosis present

## 2021-06-15 DIAGNOSIS — Z87891 Personal history of nicotine dependence: Secondary | ICD-10-CM | POA: Diagnosis not present

## 2021-06-15 DIAGNOSIS — N401 Enlarged prostate with lower urinary tract symptoms: Secondary | ICD-10-CM | POA: Diagnosis present

## 2021-06-15 DIAGNOSIS — R338 Other retention of urine: Secondary | ICD-10-CM | POA: Diagnosis present

## 2021-06-15 DIAGNOSIS — Z87442 Personal history of urinary calculi: Secondary | ICD-10-CM | POA: Diagnosis not present

## 2021-06-15 DIAGNOSIS — Z881 Allergy status to other antibiotic agents status: Secondary | ICD-10-CM | POA: Diagnosis not present

## 2021-06-15 DIAGNOSIS — Z9049 Acquired absence of other specified parts of digestive tract: Secondary | ICD-10-CM | POA: Diagnosis not present

## 2021-06-15 DIAGNOSIS — M5432 Sciatica, left side: Secondary | ICD-10-CM | POA: Diagnosis present

## 2021-06-15 DIAGNOSIS — R102 Pelvic and perineal pain: Secondary | ICD-10-CM | POA: Diagnosis present

## 2021-06-15 DIAGNOSIS — R300 Dysuria: Secondary | ICD-10-CM | POA: Diagnosis present

## 2021-06-15 DIAGNOSIS — R3915 Urgency of urination: Secondary | ICD-10-CM | POA: Diagnosis present

## 2021-06-15 DIAGNOSIS — N138 Other obstructive and reflux uropathy: Secondary | ICD-10-CM | POA: Diagnosis present

## 2021-06-15 DIAGNOSIS — E039 Hypothyroidism, unspecified: Secondary | ICD-10-CM | POA: Diagnosis present

## 2021-06-15 DIAGNOSIS — Z79899 Other long term (current) drug therapy: Secondary | ICD-10-CM | POA: Diagnosis not present

## 2021-06-15 DIAGNOSIS — Z79890 Hormone replacement therapy (postmenopausal): Secondary | ICD-10-CM | POA: Diagnosis not present

## 2021-06-15 DIAGNOSIS — R310 Gross hematuria: Secondary | ICD-10-CM | POA: Diagnosis present

## 2021-06-15 DIAGNOSIS — N020 Recurrent and persistent hematuria with minor glomerular abnormality: Secondary | ICD-10-CM | POA: Diagnosis not present

## 2021-06-15 LAB — URINE CULTURE: Culture: NO GROWTH

## 2021-06-15 MED ORDER — FINASTERIDE 5 MG PO TABS: 5.0000 mg | ORAL_TABLET | Freq: Every day | ORAL | 3 refills | Status: DC

## 2021-06-15 MED ORDER — CIPROFLOXACIN HCL 250 MG PO TABS
250.0000 mg | ORAL_TABLET | Freq: Two times a day (BID) | ORAL | 0 refills | Status: AC
Start: 2021-06-15 — End: 2021-06-22

## 2021-06-15 NOTE — Progress Notes (Signed)
Subjective: Patient reports resolution of pelvic pain. Urine light pink.   Objective: Vital signs in last 24 hours: Temp:  [97.8 F (36.6 C)-98.2 F (36.8 C)] 98.2 F (36.8 C) (11/23 0526) Pulse Rate:  [69-71] 70 (11/23 0526) Resp:  [18-20] 19 (11/23 0526) BP: (130-160)/(73-84) 130/73 (11/23 0526) SpO2:  [97 %-100 %] 97 % (11/23 0526)  Intake/Output from previous day: 11/22 0701 - 11/23 0700 In: 2195.6 [P.O.:960; I.V.:1235.6] Out: 3150 [Urine:3150] Intake/Output this shift: No intake/output data recorded.  Physical Exam:  General:alert, cooperative, and appears stated age GI: soft, non tender, normal bowel sounds, no palpable masses, no organomegaly, no inguinal hernia Male genitalia: not done Extremities: extremities normal, atraumatic, no cyanosis or edema  Lab Results: Recent Labs    06/13/21 0911 06/14/21 0630  HGB 13.6 12.0*  HCT 39.4 35.9*   BMET Recent Labs    06/13/21 0911 06/14/21 0630  NA 135 136  K 3.7 4.0  CL 106 106  CO2 23 24  GLUCOSE 114* 110*  BUN 19 18  CREATININE 1.00 0.97  CALCIUM 9.6 9.3   Recent Labs    06/13/21 0911  INR 1.1   No results for input(s): LABURIN in the last 72 hours. Results for orders placed or performed during the hospital encounter of 06/13/21  Resp Panel by RT-PCR (Flu A&B, Covid) Nasopharyngeal Swab     Status: None   Collection Time: 06/13/21  5:45 PM   Specimen: Nasopharyngeal Swab; Nasopharyngeal(NP) swabs in vial transport medium  Result Value Ref Range Status   SARS Coronavirus 2 by RT PCR NEGATIVE NEGATIVE Final    Comment: (NOTE) SARS-CoV-2 target nucleic acids are NOT DETECTED.  The SARS-CoV-2 RNA is generally detectable in upper respiratory specimens during the acute phase of infection. The lowest concentration of SARS-CoV-2 viral copies this assay can detect is 138 copies/mL. A negative result does not preclude SARS-Cov-2 infection and should not be used as the sole basis for treatment or other  patient management decisions. A negative result may occur with  improper specimen collection/handling, submission of specimen other than nasopharyngeal swab, presence of viral mutation(s) within the areas targeted by this assay, and inadequate number of viral copies(<138 copies/mL). A negative result must be combined with clinical observations, patient history, and epidemiological information. The expected result is Negative.  Fact Sheet for Patients:  EntrepreneurPulse.com.au  Fact Sheet for Healthcare Providers:  IncredibleEmployment.be  This test is no t yet approved or cleared by the [place] FDA and  has been authorized for detection and/or diagnosis of SARS-CoV-2 by FDA under an Emergency Use Authorization (EUA). This EUA will remain  in effect (meaning this test can be used) for the duration of the COVID-19 declaration under Section 564(b)(1) of the Act, 21 U.S.C.section 360bbb-3(b)(1), unless the authorization is terminated  or revoked sooner.       Influenza A by PCR NEGATIVE NEGATIVE Final   Influenza B by PCR NEGATIVE NEGATIVE Final    Comment: (NOTE) The Xpert Xpress SARS-CoV-2/FLU/RSV plus assay is intended as an aid in the diagnosis of influenza from Nasopharyngeal swab specimens and should not be used as a sole basis for treatment. Nasal washings and aspirates are unacceptable for Xpert Xpress SARS-CoV-2/FLU/RSV testing.  Fact Sheet for Patients: EntrepreneurPulse.com.au  Fact Sheet for Healthcare Providers: IncredibleEmployment.be  This test is not yet approved or cleared by the [place] FDA and has been authorized for detection and/or diagnosis of SARS-CoV-2 by FDA under an Emergency Use Authorization (EUA). This EUA will  remain in effect (meaning this test can be used) for the duration of the COVID-19 declaration under Section 564(b)(1) of the Act, 21 U.S.C. section  360bbb-3(b)(1), unless the authorization is terminated or revoked.  Performed at [facility], [address], [place], [place] [address]   Urine Culture     Status: None   Collection Time: 06/14/21  6:40 AM   Specimen: Urine, Clean Catch  Result Value Ref Range Status   Specimen Description   Final    URINE, CLEAN CATCH Performed at [facility], [address] [place], [place] [address]    Special Requests   Final    NONE Performed at [facility], [address], [place], [place] [address]    Culture   Final    NO GROWTH Performed at [facility], [place] [address], [place], [place] [address]    Report Status 06/15/2021 FINAL  Final    Studies/Results: CT Renal Stone Study  Result Date: 06/13/2021 CLINICAL DATA:  Gross hematuria. EXAM: CT ABDOMEN AND PELVIS WITHOUT CONTRAST TECHNIQUE: Multidetector CT imaging of the abdomen and pelvis was performed following the standard protocol without IV contrast. COMPARISON:  May 11, 2020. FINDINGS: Lower chest: No acute abnormality. Hepatobiliary: No focal liver abnormality is seen. Status post cholecystectomy. No biliary dilatation. Pancreas: Unremarkable. No pancreatic ductal dilatation or surrounding inflammatory changes. Spleen: Normal in size without focal abnormality. Adrenals/Urinary Tract: Adrenal glands appear normal. Nonobstructive left nephrolithiasis is noted. Minimal bilateral hydroureteronephrosis is noted without evidence of obstructing calculus. Multiple layering calculi are noted posteriorly in the urinary bladder. Foley catheter appears to be inflated within the penile portion of the urethra. Stomach/Bowel: Stomach is within normal limits. Appendix appears normal. No evidence of bowel wall thickening, distention, or inflammatory changes. Vascular/Lymphatic: Aortic atherosclerosis. No enlarged abdominal or pelvic lymph nodes. Reproductive: Stable mild prostatic enlargement is noted. Prostatic calcifications are noted. Other:  Small bilateral fat containing inguinal hernias are noted. Stable small periumbilical hernia is noted which contains a loop of small bowel, but does not result in obstruction. Musculoskeletal: No acute or significant osseous findings. IMPRESSION: Foley catheter appears to be inflated within the penile urethra; repositioning is recommended. Critical Value/emergent results were called by telephone at the time of interpretation on 06/13/2021 at 2:41 pm to provider [facility] , who verbally acknowledged these results. Nonobstructive left nephrolithiasis. Mild bilateral hydroureteronephrosis is noted without evidence of obstructing ureteral calculus. Mild layering calculi are noted posteriorly in the dependent portion of the urinary bladder. Stable mild prostatic enlargement. Aortic Atherosclerosis (ICD10-I70.0). Electronically Signed   By: Marijo Conception M.D.   On: 06/13/2021 14:42    Assessment/Plan: 80yo with gross hematuria and urinary retention  Patient can be discharged home with foley and followup Monday for a voiding trial We will start finasteride 5mg  daily and continue flomax 0.4mg  daily   LOS: 0 days   Nicolette Bang 06/15/2021, 9:55 AM

## 2021-06-15 NOTE — Discharge Summary (Signed)
Physician Discharge Summary  Joseth Weigel Barga JQG:[id number] DOB: December 28, 1940 DOA: 06/13/2021  PCP: [facility]  Admit date: 06/13/2021 Discharge date: 06/15/2021  Admitted From: Home Disposition: Home  Recommendations for Outpatient Follow-up:  Follow up with PCP in 1-2 weeks Keep up your schedule appointment with urology next Monday.  Home Health: Not applicable Equipment/Devices: Foley catheter  Discharge Condition: Stable CODE STATUS: Full code Diet recommendation: Low-salt diet  Discharge summary: 80 year old gentleman with history of BPH, seizure disorder, nephrolithiasis presented with 14 hours of inability to urinate and suprapubic pain and discomfort.  He also had hematuria with blood clots.  Multiple attempts in the ER to put Foley catheter were unsuccessful, urology ultimately placed 24 French catheter, started on CBI and admitted to the hospital.  Patient was treated with continuous bladder irrigation with improvement.  Now is spontaneously urinating with pink urine but no evidence of active bleeding.  Hemoglobin is stable.  As per urology recommendations, he is going home with Flomax, added finasteride.  He will be going home with Foley catheter, voiding trial scheduled 11/28 at urology clinic. Also prescribed ciprofloxacin 250 mg twice a day for 7 days.  Urine cultures were negative. All other medications were continued.   Discharge Diagnoses:  Principal Problem:   Hematuria Active Problems:   Bladder calculi   Benign prostatic hyperplasia with urinary obstruction   Seizure ([place])   Nephrolithiasis   Urinary obstruction   Hypothyroidism   Sciatica    Discharge Instructions  Discharge Instructions     Call MD for:   Complete by: As directed    Fresh blood clots seen in catheter , pain, distension   Call MD for:  severe uncontrolled pain   Complete by: As directed    Diet - low sodium heart healthy   Complete by: As directed    Increase  activity slowly   Complete by: As directed       Allergies as of 06/15/2021       Reactions   Sulfa Antibiotics Hives, Rash        Medication List     STOP taking these medications    aspirin 81 MG tablet   polyethylene glycol-electrolytes 420 g solution Commonly known as: TriLyte       TAKE these medications    Centrum Silver Adult 50+ Tabs Take 1 tablet by mouth daily.   ciprofloxacin 250 MG tablet Commonly known as: CIPRO Take 1 tablet (250 mg total) by mouth 2 (two) times daily for 7 days.   finasteride 5 MG tablet Commonly known as: Proscar Take 1 tablet (5 mg total) by mouth daily.   metroNIDAZOLE 1 % gel Commonly known as: METROGEL Apply 1 application topically daily as needed (Rosacea). Apply to face   Oxcarbazepine 300 MG tablet Commonly known as: TRILEPTAL Take 300 mg by mouth 2 (two) times daily.   sodium chloride 0.65 % nasal spray Commonly known as: OCEAN Place 1 spray into the nose daily as needed for congestion.   Synthroid 50 MCG tablet Generic drug: levothyroxine Take 50 mcg by mouth daily.   tamsulosin 0.4 MG Caps capsule Commonly known as: FLOMAX Take 1 capsule (0.4 mg total) by mouth at bedtime.   zonisamide 100 MG capsule Commonly known as: ZONEGRAN Take 400 mg by mouth daily.        Follow-up Information     McKenzie, Candee Furbish, MD. Call on 06/20/2021.   Specialty: Urology Contact information: [phone]  [place]  [phone]                Allergies  Allergen Reactions   Sulfa Antibiotics Hives and Rash    Consultations: Urology   Procedures/Studies: CT Renal Stone Study  Result Date: 06/13/2021 CLINICAL DATA:  Gross hematuria. EXAM: CT ABDOMEN AND PELVIS WITHOUT CONTRAST TECHNIQUE: Multidetector CT imaging of the abdomen and pelvis was performed following the standard protocol without IV contrast. COMPARISON:  May 11, 2020. FINDINGS: Lower chest: No acute  abnormality. Hepatobiliary: No focal liver abnormality is seen. Status post cholecystectomy. No biliary dilatation. Pancreas: Unremarkable. No pancreatic ductal dilatation or surrounding inflammatory changes. Spleen: Normal in size without focal abnormality. Adrenals/Urinary Tract: Adrenal glands appear normal. Nonobstructive left nephrolithiasis is noted. Minimal bilateral hydroureteronephrosis is noted without evidence of obstructing calculus. Multiple layering calculi are noted posteriorly in the urinary bladder. Foley catheter appears to be inflated within the penile portion of the urethra. Stomach/Bowel: Stomach is within normal limits. Appendix appears normal. No evidence of bowel wall thickening, distention, or inflammatory changes. Vascular/Lymphatic: Aortic atherosclerosis. No enlarged abdominal or pelvic lymph nodes. Reproductive: Stable mild prostatic enlargement is noted. Prostatic calcifications are noted. 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 Stable small periumbilical hernia is noted which contains a loop of small bowel, but does not result in obstruction. Musculoskeletal: No acute or significant osseous findings. IMPRESSION: Foley catheter appears to be inflated within the penile urethra; repositioning is recommended. Critical Value/emergent results were called by telephone at the time of interpretation on 06/13/2021 at 2:41 pm to provider [facility] , who verbally acknowledged these results. Nonobstructive left nephrolithiasis. Mild bilateral hydroureteronephrosis is noted without evidence of obstructing ureteral calculus. Mild layering calculi are noted posteriorly in the dependent portion of the urinary bladder. Stable mild prostatic enlargement. Aortic Atherosclerosis (ICD10-I70.0). Electronically Signed   By: Marijo Conception M.D.   On: 06/13/2021 14:42   (Echo, Carotid, EGD, Colonoscopy, ERCP)    Subjective: Patient seen and examined.  Wife at the bedside.   Without any complaints but anxious about pinkish urine.  CBI off since last 24 hours.  Denies any suprapubic pain or discomfort.  Does have some leakage of urine around the catheter site.  Afebrile.   Discharge Exam: Vitals:   06/14/21 2059 06/15/21 0526  BP: (!) 160/84 130/73  Pulse: 71 70  Resp: 20 19  Temp: 97.8 F (36.6 C) 98.2 F (36.8 C)  SpO2: 100% 97%   Vitals:   06/14/21 0400 06/14/21 1415 06/14/21 2059 06/15/21 0526  BP: 121/72 140/84 (!) 160/84 130/73  Pulse: 64 69 71 70  Resp: 18 18 20 19   Temp: 98 F (36.7 C) 97.8 F (36.6 C) 97.8 F (36.6 C) 98.2 F (36.8 C)  TempSrc: Oral Oral Oral Oral  SpO2: 99% 100% 100% 97%  Weight:      Height:        General: Pt is alert, awake, not in acute distress Cardiovascular: RRR, S1/S2 +, no rubs, no gallops Respiratory: CTA bilaterally, no wheezing, no rhonchi Abdominal: Soft, NT, ND, bowel sounds + Extremities: no edema, no cyanosis Foley catheter with freely flowing pink urine.    The results of significant diagnostics from this hospitalization (including imaging, microbiology, ancillary and laboratory) are listed below for reference.     Microbiology: Recent Results (from the past 240 hour(s))  Resp Panel by RT-PCR (Flu A&B, Covid) Nasopharyngeal Swab     Status: None   Collection Time: 06/13/21  5:45 PM  Specimen: Nasopharyngeal Swab; Nasopharyngeal(NP) swabs in vial transport medium  Result Value Ref Range Status   SARS Coronavirus 2 by RT PCR NEGATIVE NEGATIVE Final    Comment: (NOTE) SARS-CoV-2 target nucleic acids are NOT DETECTED.  The SARS-CoV-2 RNA is generally detectable in upper respiratory specimens during the acute phase of infection. The lowest concentration of SARS-CoV-2 viral copies this assay can detect is 138 copies/mL. A negative result does not preclude SARS-Cov-2 infection and should not be used as the sole basis for treatment or other patient management decisions. A negative result may  occur with  improper specimen collection/handling, submission of specimen other than nasopharyngeal swab, presence of viral mutation(s) within the areas targeted by this assay, and inadequate number of viral copies(<138 copies/mL). A negative result must be combined with clinical observations, patient history, and epidemiological information. The expected result is Negative.  Fact Sheet for Patients:  EntrepreneurPulse.com.au  Fact Sheet for Healthcare Providers:  IncredibleEmployment.be  This test is no t yet approved or cleared by the [place] FDA and  has been authorized for detection and/or diagnosis of SARS-CoV-2 by FDA under an Emergency Use Authorization (EUA). This EUA will remain  in effect (meaning this test can be used) for the duration of the COVID-19 declaration under Section 564(b)(1) of the Act, 21 U.S.C.section 360bbb-3(b)(1), unless the authorization is terminated  or revoked sooner.       Influenza A by PCR NEGATIVE NEGATIVE Final   Influenza B by PCR NEGATIVE NEGATIVE Final    Comment: (NOTE) The Xpert Xpress SARS-CoV-2/FLU/RSV plus assay is intended as an aid in the diagnosis of influenza from Nasopharyngeal swab specimens and should not be used as a sole basis for treatment. Nasal washings and aspirates are unacceptable for Xpert Xpress SARS-CoV-2/FLU/RSV testing.  Fact Sheet for Patients: EntrepreneurPulse.com.au  Fact Sheet for Healthcare Providers: IncredibleEmployment.be  This test is not yet approved or cleared by the [place] FDA and has been authorized for detection and/or diagnosis of SARS-CoV-2 by FDA under an Emergency Use Authorization (EUA). This EUA will remain in effect (meaning this test can be used) for the duration of the COVID-19 declaration under Section 564(b)(1) of the Act, 21 U.S.C. section 360bbb-3(b)(1), unless the authorization is terminated  or revoked.  Performed at [facility], [address] [place], [place] [address]   Urine Culture     Status: None   Collection Time: 06/14/21  6:40 AM   Specimen: Urine, Clean Catch  Result Value Ref Range Status   Specimen Description   Final    URINE, CLEAN CATCH Performed at [facility], [address], [place], [place] [address]    Special Requests   Final    NONE Performed at [facility], [address] [place], [place] [address]    Culture   Final    NO GROWTH Performed at [facility], [place] [address], [place], [place] [address]    Report Status 06/15/2021 FINAL  Final     Labs: BNP (last 3 results) No results for input(s): BNP in the last 8760 hours. Basic Metabolic Panel: Recent Labs  Lab 06/13/21 0911 06/14/21 0630  NA 135 136  K 3.7 4.0  CL 106 106  CO2 23 24  GLUCOSE 114* 110*  BUN 19 18  CREATININE 1.00 0.97  CALCIUM 9.6 9.3   Liver Function Tests: Recent Labs  Lab 06/14/21 0630  AST 20  ALT 15  ALKPHOS 63  BILITOT 1.1  PROT 6.1*  ALBUMIN 3.7  No results for input(s): LIPASE, AMYLASE in the last 168 hours. No results for input(s): AMMONIA in the last 168 hours. CBC: Recent Labs  Lab 06/13/21 0911 06/14/21 0630  WBC 5.3 7.8  NEUTROABS 3.9  --   HGB 13.6 12.0*  HCT 39.4 35.9*  MCV 93.1 93.7  PLT 185 182   Cardiac Enzymes: No results for input(s): CKTOTAL, CKMB, CKMBINDEX, TROPONINI in the last 168 hours. BNP: Invalid input(s): POCBNP CBG: Recent Labs  Lab 06/14/21 2059  GLUCAP 107*   D-Dimer No results for input(s): DDIMER in the last 72 hours. Hgb A1c No results for input(s): HGBA1C in the last 72 hours. Lipid Profile No results for input(s): CHOL, HDL, LDLCALC, TRIG, CHOLHDL, LDLDIRECT in the last 72 hours. Thyroid function studies No results for input(s): TSH, T4TOTAL, T3FREE, THYROIDAB in the last 72 hours.  Invalid input(s): FREET3 Anemia work up No results for input(s): VITAMINB12, FOLATE, FERRITIN, TIBC,  IRON, RETICCTPCT in the last 72 hours. Urinalysis    Component Value Date/Time   COLORURINE AMBER (A) 06/14/2021 0640   APPEARANCEUR CLOUDY (A) 06/14/2021 0640   APPEARANCEUR Clear 02/04/2021 1234   LABSPEC 1.013 06/14/2021 0640   PHURINE 7.0 06/14/2021 0640   GLUCOSEU NEGATIVE 06/14/2021 0640   HGBUR LARGE (A) 06/14/2021 0640   BILIRUBINUR NEGATIVE 06/14/2021 0640   BILIRUBINUR Negative 02/04/2021 1234   KETONESUR NEGATIVE 06/14/2021 0640   PROTEINUR 100 (A) 06/14/2021 0640   NITRITE NEGATIVE 06/14/2021 0640   LEUKOCYTESUR MODERATE (A) 06/14/2021 0640   Sepsis Labs Invalid input(s): PROCALCITONIN,  WBC,  LACTICIDVEN Microbiology Recent Results (from the past 240 hour(s))  Resp Panel by RT-PCR (Flu A&B, Covid) Nasopharyngeal Swab     Status: None   Collection Time: 06/13/21  5:45 PM   Specimen: Nasopharyngeal Swab; Nasopharyngeal(NP) swabs in vial transport medium  Result Value Ref Range Status   SARS Coronavirus 2 by RT PCR NEGATIVE NEGATIVE Final    Comment: (NOTE) SARS-CoV-2 target nucleic acids are NOT DETECTED.  The SARS-CoV-2 RNA is generally detectable in upper respiratory specimens during the acute phase of infection. The lowest concentration of SARS-CoV-2 viral copies this assay can detect is 138 copies/mL. A negative result does not preclude SARS-Cov-2 infection and should not be used as the sole basis for treatment or other patient management decisions. A negative result may occur with  improper specimen collection/handling, submission of specimen other than nasopharyngeal swab, presence of viral mutation(s) within the areas targeted by this assay, and inadequate number of viral copies(<138 copies/mL). A negative result must be combined with clinical observations, patient history, and epidemiological information. The expected result is Negative.  Fact Sheet for Patients:  EntrepreneurPulse.com.au  Fact Sheet for Healthcare Providers:   IncredibleEmployment.be  This test is no t yet approved or cleared by the [place] FDA and  has been authorized for detection and/or diagnosis of SARS-CoV-2 by FDA under an Emergency Use Authorization (EUA). This EUA will remain  in effect (meaning this test can be used) for the duration of the COVID-19 declaration under Section 564(b)(1) of the Act, 21 U.S.C.section 360bbb-3(b)(1), unless the authorization is terminated  or revoked sooner.       Influenza A by PCR NEGATIVE NEGATIVE Final   Influenza B by PCR NEGATIVE NEGATIVE Final    Comment: (NOTE) The Xpert Xpress SARS-CoV-2/FLU/RSV plus assay is intended as an aid in the diagnosis of influenza from Nasopharyngeal swab specimens and should not be used as a sole basis for treatment. Nasal washings and aspirates  are unacceptable for Xpert Xpress SARS-CoV-2/FLU/RSV testing.  Fact Sheet for Patients: EntrepreneurPulse.com.au  Fact Sheet for Healthcare Providers: IncredibleEmployment.be  This test is not yet approved or cleared by the [place] FDA and has been authorized for detection and/or diagnosis of SARS-CoV-2 by FDA under an Emergency Use Authorization (EUA). This EUA will remain in effect (meaning this test can be used) for the duration of the COVID-19 declaration under Section 564(b)(1) of the Act, 21 U.S.C. section 360bbb-3(b)(1), unless the authorization is terminated or revoked.  Performed at [facility], [address], [place], [place] [address]   Urine Culture     Status: None   Collection Time: 06/14/21  6:40 AM   Specimen: Urine, Clean Catch  Result Value Ref Range Status   Specimen Description   Final    URINE, CLEAN CATCH Performed at [facility], [address], [place], [place] [address]    Special Requests   Final    NONE Performed at [facility], [address], [place], [place] [address]    Culture   Final    NO GROWTH Performed  at [facility], [place] [address], [place], [place] [address]    Report Status 06/15/2021 FINAL  Final     Time coordinating discharge: 32 minutes  SIGNED:   Barb Merino, MD  [facility] 06/15/2021, 10:07 AM

## 2021-06-20 ENCOUNTER — Encounter: Payer: Self-pay | Admitting: Urology

## 2021-06-20 ENCOUNTER — Other Ambulatory Visit: Payer: Self-pay

## 2021-06-20 ENCOUNTER — Ambulatory Visit: Payer: Medicare Other | Admitting: Urology

## 2021-06-20 ENCOUNTER — Ambulatory Visit: Payer: Medicare Other

## 2021-06-20 ENCOUNTER — Ambulatory Visit (INDEPENDENT_AMBULATORY_CARE_PROVIDER_SITE_OTHER): Payer: Medicare Other | Admitting: Urology

## 2021-06-20 VITALS — BP 151/80 | HR 66

## 2021-06-20 DIAGNOSIS — R339 Retention of urine, unspecified: Secondary | ICD-10-CM

## 2021-06-20 NOTE — Progress Notes (Signed)
Urological Symptom Review  Patient is experiencing the following symptoms: Frequent urination Hard to postpone urination Get up at night to urinate Blood in urine   Review of Systems  Gastrointestinal (upper)  : Negative for upper GI symptoms  Gastrointestinal (lower) : Negative for lower GI symptoms  Constitutional : Negative for symptoms  Skin: Negative for skin symptoms  Eyes: Negative for eye symptoms  Ear/Nose/Throat : Negative for Ear/Nose/Throat symptoms  Hematologic/Lymphatic: Easy bruising  Cardiovascular : Negative for cardiovascular symptoms  Respiratory : Negative for respiratory symptoms  Endocrine: Negative for endocrine symptoms  Musculoskeletal: Negative for musculoskeletal symptoms  Neurological: Negative for neurological symptoms  Psychologic: Negative for psychiatric symptoms

## 2021-06-20 NOTE — Progress Notes (Signed)
Fill and Pull Catheter Removal  Patient is present today for a catheter removal.  Patient was cleaned and prepped in a sterile fashion 234ml of sterile water/ saline was instilled into the bladder when the patient felt the urge to urinate. 23ml of water was then drained from the balloon.  A 24FR foley cath was removed from the bladder no complications were noted .  Patient as then given some time to void on their own.  Patient can void  149ml on their own after some time.  Patient tolerated well.  Performed by: Estill Bamberg RN  Follow up/ Additional notes: MD to see

## 2021-06-20 NOTE — Progress Notes (Signed)
06/20/2021 10:48 AM   [name] [birth year] [id number]  Referring provider: Practice, [facility] [place],  [place] [address]  Followup gross hematuria   HPI: [name] is a 80yo here for followup after foley placement for gross hematuria. Urine is now clear. He has significant urgency with the foley in place. Voiding trial was completed this morning and patient returned this afternoon with an inability to urinate. A foley was placed and 800cc of dark urine drained.   PMH: Past Medical History:  Diagnosis Date   History of colon polyps    removed with colonoscopy   Kidney stone    in past-has one at present, not bothersome   Seizures ([place])    none in 10 yrs- controlled with meds([name] [phone])    Surgical History: Past Surgical History:  Procedure Laterality Date   CHOLECYSTECTOMY  07/25/2012   Procedure: LAPAROSCOPIC CHOLECYSTECTOMY WITH INTRAOPERATIVE CHOLANGIOGRAM;  Surgeon: Fernando Ok, MD;  Location: [facility];  Service: General;;   COLONOSCOPY  11/15/2011   Procedure: COLONOSCOPY;  Surgeon: Fernando Houston, MD;  [facility];  Service: Endoscopy;  Laterality: N/A;  730   ELBOW SURGERY     chipped bone right elbow   TONSILLECTOMY     VASECTOMY      Home Medications:  Allergies as of 06/20/2021       Reactions   Sulfa Antibiotics Hives, Rash        Medication List        Accurate as of June 20, 2021 10:48 AM. If you have any questions, ask your nurse or doctor.          Centrum Silver Adult 50+ Tabs Take 1 tablet by mouth daily.   ciprofloxacin 250 MG tablet Commonly known as: CIPRO Take 1 tablet (250 mg total) by mouth 2 (two) times daily for 7 days.   finasteride 5 MG tablet Commonly known as: Proscar Take 1 tablet (5 mg total) by mouth daily.   metroNIDAZOLE 1 % gel Commonly known as: METROGEL Apply 1 application topically daily as needed (Rosacea). Apply to face   Oxcarbazepine 300 MG  tablet Commonly known as: TRILEPTAL Take 300 mg by mouth 2 (two) times daily.   sodium chloride 0.65 % nasal spray Commonly known as: OCEAN Place 1 spray into the nose daily as needed for congestion.   Synthroid 50 MCG tablet Generic drug: levothyroxine Take 50 mcg by mouth daily.   tamsulosin 0.4 MG Caps capsule Commonly known as: FLOMAX Take 1 capsule (0.4 mg total) by mouth at bedtime.   zonisamide 100 MG capsule Commonly known as: ZONEGRAN Take 400 mg by mouth daily.        Allergies:  Allergies  Allergen Reactions   Sulfa Antibiotics Hives and Rash    Family History: Family History  Family history unknown: Yes    Social History:  reports that he quit smoking about 38 years ago. His smoking use included cigarettes. He smoked an average of .5 packs per day. He has never used smokeless tobacco. He reports current alcohol use of about 1.0 standard drink per week. He reports that he does not use drugs.  ROS: All other review of systems were reviewed and are negative except what is noted above in HPI  Physical Exam: BP (!) 151/80   Pulse 66   Constitutional:  Alert and oriented, No acute distress. HEENT: [place] AT, moist mucus membranes.  Trachea midline, no masses. Cardiovascular: No clubbing,  cyanosis, or edema. Respiratory: Normal respiratory effort, no increased work of breathing. GI: Abdomen is soft, nontender, nondistended, no abdominal masses GU: No CVA tenderness.  Lymph: No cervical or inguinal lymphadenopathy. Skin: No rashes, bruises or suspicious lesions. Neurologic: Grossly intact, no focal deficits, moving all 4 extremities. Psychiatric: Normal mood and affect.  Laboratory Data: Lab Results  Component Value Date   WBC 7.8 06/14/2021   HGB 12.0 (L) 06/14/2021   HCT 35.9 (L) 06/14/2021   MCV 93.7 06/14/2021   PLT 182 06/14/2021    Lab Results  Component Value Date   CREATININE 0.97 06/14/2021    No results found for: PSA  No results found  for: TESTOSTERONE  No results found for: HGBA1C  Urinalysis    Component Value Date/Time   COLORURINE AMBER (A) 06/14/2021 0640   APPEARANCEUR CLOUDY (A) 06/14/2021 0640   APPEARANCEUR Clear 02/04/2021 1234   LABSPEC 1.013 06/14/2021 0640   PHURINE 7.0 06/14/2021 [place] 06/14/2021 0640   HGBUR LARGE (A) 06/14/2021 0640   BILIRUBINUR NEGATIVE 06/14/2021 0640   BILIRUBINUR Negative 02/04/2021 1234   [place] 06/14/2021 0640   PROTEINUR 100 (A) 06/14/2021 0640   NITRITE NEGATIVE 06/14/2021 0640   LEUKOCYTESUR MODERATE (A) 06/14/2021 0640    Lab Results  Component Value Date   LABMICR See below: 02/04/2021   WBCUA 0-5 02/04/2021   LABEPIT 0-10 02/04/2021   MUCUS Present 02/04/2021   BACTERIA NONE SEEN 06/14/2021    Pertinent Imaging:  No results found for this or any previous visit.  No results found for this or any previous visit.  No results found for this or any previous visit.  No results found for this or any previous visit.  No results found for this or any previous visit.  No results found for this or any previous visit.  No results found for this or any previous visit.  Results for orders placed during the hospital encounter of 06/13/21  CT Renal Stone Study  Narrative CLINICAL DATA:  Gross hematuria.  EXAM: CT ABDOMEN AND PELVIS WITHOUT CONTRAST  TECHNIQUE: Multidetector CT imaging of the abdomen and pelvis was performed following the standard protocol without IV contrast.  COMPARISON:  May 11, 2020.  FINDINGS: Lower chest: No acute abnormality.  Hepatobiliary: No focal liver abnormality is seen. Status post cholecystectomy. No biliary dilatation.  Pancreas: Unremarkable. No pancreatic ductal dilatation or surrounding inflammatory changes.  Spleen: Normal in size without focal abnormality.  Adrenals/Urinary Tract: Adrenal glands appear normal. Nonobstructive left nephrolithiasis is noted. Minimal  bilateral hydroureteronephrosis is noted without evidence of obstructing calculus. Multiple layering calculi are noted posteriorly in the urinary bladder. Foley catheter appears to be inflated within the penile portion of the urethra.  Stomach/Bowel: Stomach is within normal limits. Appendix appears normal. No evidence of bowel wall thickening, distention, or inflammatory changes.  Vascular/Lymphatic: Aortic atherosclerosis. No enlarged abdominal or pelvic lymph nodes.  Reproductive: Stable mild prostatic enlargement is noted. Prostatic calcifications are noted.  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 Stable small periumbilical hernia is noted which contains a loop of small bowel, but does not result in obstruction.  Musculoskeletal: No acute or significant osseous findings.  IMPRESSION: Foley catheter appears to be inflated within the penile urethra; repositioning is recommended. Critical Value/emergent results were called by telephone at the time of interpretation on 06/13/2021 at 2:41 pm to provider [facility] , who verbally acknowledged these results.  Nonobstructive left nephrolithiasis.  Mild bilateral hydroureteronephrosis is  noted without evidence of obstructing ureteral calculus.  Mild layering calculi are noted posteriorly in the dependent portion of the urinary bladder.  Stable mild prostatic enlargement.  Aortic Atherosclerosis (ICD10-I70.0).   Electronically Signed By: Marijo Conception M.D. On: 06/13/2021 14:42   Assessment & Plan:    1. Incomplete emptying of bladder -RTC 1 week for a voiding trial   Return for 1 week NV PVR then 3 months with MD.  Nicolette Bang, MD  [facility] [place]

## 2021-06-20 NOTE — Patient Instructions (Signed)

## 2021-06-21 ENCOUNTER — Telehealth: Payer: Self-pay

## 2021-06-21 NOTE — Telephone Encounter (Signed)
Patient called office this am to report he had a blood clot that clogged his catheter last night but after walking some the clot has passed and urine is draining into bag with no difficulty. Urine is still blood tinged per patient.   Encouraged patient to keep fluid intake up to help. Patient voiced understanding. Patient will call office back if clot returns and urine is unable to drain.

## 2021-06-22 ENCOUNTER — Ambulatory Visit: Payer: TRICARE For Life (TFL) | Admitting: Urology

## 2021-06-27 ENCOUNTER — Other Ambulatory Visit: Payer: Self-pay

## 2021-06-27 ENCOUNTER — Ambulatory Visit (INDEPENDENT_AMBULATORY_CARE_PROVIDER_SITE_OTHER): Payer: Medicare Other

## 2021-06-27 DIAGNOSIS — R339 Retention of urine, unspecified: Secondary | ICD-10-CM | POA: Diagnosis not present

## 2021-06-27 NOTE — Progress Notes (Signed)
Fill and Pull Catheter Removal  Patient is present today for a catheter removal.  Patient was cleaned and prepped in a sterile fashion 11ml of sterile water/ saline was instilled into the bladder when the patient felt the urge to urinate. 50ml of water was then drained from the balloon.  A 22FR coude foley cath was removed from the bladder no complications were noted .  Patient as then given some time to void on their own.  Patient can void  140ml on their own after some time.  Patient tolerated well.  Performed by: Moriya Mitchell LPN  Follow up/ Additional notes: Patient will return this afternoon for PVR   post void residual=172  Patient will f/u later this week for PVR

## 2021-06-28 DIAGNOSIS — L570 Actinic keratosis: Secondary | ICD-10-CM | POA: Diagnosis not present

## 2021-07-01 ENCOUNTER — Other Ambulatory Visit: Payer: Self-pay

## 2021-07-01 ENCOUNTER — Ambulatory Visit (INDEPENDENT_AMBULATORY_CARE_PROVIDER_SITE_OTHER): Payer: Medicare Other

## 2021-07-01 DIAGNOSIS — R339 Retention of urine, unspecified: Secondary | ICD-10-CM | POA: Diagnosis not present

## 2021-07-01 LAB — BLADDER SCAN AMB NON-IMAGING: Scan Result: 160

## 2021-07-01 NOTE — Progress Notes (Signed)
Patient here today for PVR check.  PVR 160  Patient will f/u in 1 month with MD with PVR

## 2021-07-06 ENCOUNTER — Ambulatory Visit: Payer: TRICARE For Life (TFL)

## 2021-07-15 DIAGNOSIS — R319 Hematuria, unspecified: Secondary | ICD-10-CM | POA: Diagnosis not present

## 2021-07-15 DIAGNOSIS — Z6827 Body mass index (BMI) 27.0-27.9, adult: Secondary | ICD-10-CM | POA: Diagnosis not present

## 2021-07-15 DIAGNOSIS — Z978 Presence of other specified devices: Secondary | ICD-10-CM | POA: Diagnosis not present

## 2021-07-15 DIAGNOSIS — N139 Obstructive and reflux uropathy, unspecified: Secondary | ICD-10-CM | POA: Diagnosis not present

## 2021-07-15 DIAGNOSIS — E039 Hypothyroidism, unspecified: Secondary | ICD-10-CM | POA: Diagnosis not present

## 2021-07-15 DIAGNOSIS — N200 Calculus of kidney: Secondary | ICD-10-CM | POA: Diagnosis not present

## 2021-08-12 ENCOUNTER — Ambulatory Visit (INDEPENDENT_AMBULATORY_CARE_PROVIDER_SITE_OTHER): Payer: Medicare Other | Admitting: Urology

## 2021-08-12 ENCOUNTER — Other Ambulatory Visit: Payer: Self-pay

## 2021-08-12 ENCOUNTER — Encounter: Payer: Self-pay | Admitting: Urology

## 2021-08-12 VITALS — BP 146/87 | HR 61

## 2021-08-12 DIAGNOSIS — N210 Calculus in bladder: Secondary | ICD-10-CM | POA: Diagnosis not present

## 2021-08-12 DIAGNOSIS — R339 Retention of urine, unspecified: Secondary | ICD-10-CM | POA: Diagnosis not present

## 2021-08-12 DIAGNOSIS — N138 Other obstructive and reflux uropathy: Secondary | ICD-10-CM | POA: Diagnosis not present

## 2021-08-12 DIAGNOSIS — N401 Enlarged prostate with lower urinary tract symptoms: Secondary | ICD-10-CM

## 2021-08-12 DIAGNOSIS — R310 Gross hematuria: Secondary | ICD-10-CM | POA: Diagnosis not present

## 2021-08-12 LAB — URINALYSIS, ROUTINE W REFLEX MICROSCOPIC
Bilirubin, UA: NEGATIVE
Glucose, UA: NEGATIVE
Ketones, UA: NEGATIVE
Nitrite, UA: NEGATIVE
Protein,UA: NEGATIVE
Specific Gravity, UA: 1.015 (ref 1.005–1.030)
Urobilinogen, Ur: 0.2 mg/dL (ref 0.2–1.0)
pH, UA: 7 (ref 5.0–7.5)

## 2021-08-12 LAB — MICROSCOPIC EXAMINATION: Renal Epithel, UA: NONE SEEN /hpf

## 2021-08-12 MED ORDER — ALFUZOSIN HCL ER 10 MG PO TB24: 10.0000 mg | ORAL_TABLET | Freq: Every day | ORAL | 11 refills | Status: DC

## 2021-08-12 NOTE — Patient Instructions (Signed)

## 2021-08-12 NOTE — Progress Notes (Signed)
post void residual=20  Urological Symptom Review  Patient is experiencing the following symptoms: Get up at night to urinate   Review of Systems  Gastrointestinal (upper)  : Negative for upper GI symptoms  Gastrointestinal (lower) : Negative for lower GI symptoms  Constitutional : Negative for symptoms  Skin: Negative for skin symptoms  Eyes: Negative for eye symptoms  Ear/Nose/Throat : Negative for Ear/Nose/Throat symptoms  Hematologic/Lymphatic: Easy bruising  Cardiovascular : Leg swelling  Respiratory : Negative for respiratory symptoms  Endocrine: Negative for endocrine symptoms  Musculoskeletal: Joint pain  Neurological: Negative for neurological symptoms  Psychologic: Negative for psychiatric symptoms

## 2021-08-12 NOTE — Progress Notes (Signed)
08/12/2021 10:18 AM   [name] 04/05/1941 [id number]  Referring provider: Practice, [facility] [place],  [place] [address]  Followup BPH with Incomplete emptying   HPI: [name] is a 81yo here for followup for BPH with incomplete emptying. IPSS 18 QOL 2 on flomax. Nocturia 3x. He is bothered by his his nocturia. He drinks 8-10 oz prior to going to bed. Urine volumes 4-5 oz at night with each void. Urine stream fair. No hematuria since his last visit. He remains on finasteride.    PMH: Past Medical History:  Diagnosis Date   History of colon polyps    removed with colonoscopy   Kidney stone    in past-has one at present, not bothersome   Seizures ([place])    none in 10 yrs- controlled with meds(Dr. Ned Clines [phone])    Surgical History: Past Surgical History:  Procedure Laterality Date   CHOLECYSTECTOMY  07/25/2012   Procedure: LAPAROSCOPIC CHOLECYSTECTOMY WITH INTRAOPERATIVE CHOLANGIOGRAM;  Surgeon: Ralene Ok, MD;  Location: [facility];  Service: General;;   COLONOSCOPY  11/15/2011   Procedure: COLONOSCOPY;  Surgeon: Rogene Houston, MD;  [facility];  Service: Endoscopy;  Laterality: N/A;  730   ELBOW SURGERY     chipped bone right elbow   TONSILLECTOMY     VASECTOMY      Home Medications:  Allergies as of 08/12/2021       Reactions   Sulfa Antibiotics Hives, Rash        Medication List        Accurate as of August 12, 2021 10:18 AM. If you have any questions, ask your nurse or doctor.          Centrum Silver Adult 50+ Tabs Take 1 tablet by mouth daily.   finasteride 5 MG tablet Commonly known as: Proscar Take 1 tablet (5 mg total) by mouth daily.   metroNIDAZOLE 1 % gel Commonly known as: METROGEL Apply 1 application topically daily as needed (Rosacea). Apply to face   Oxcarbazepine 300 MG tablet Commonly known as: TRILEPTAL Take 300 mg by mouth 2 (two) times daily.   sodium chloride 0.65 % nasal  spray Commonly known as: OCEAN Place 1 spray into the nose daily as needed for congestion.   Synthroid 50 MCG tablet Generic drug: levothyroxine Take 50 mcg by mouth daily.   tamsulosin 0.4 MG Caps capsule Commonly known as: FLOMAX Take 1 capsule (0.4 mg total) by mouth at bedtime.   zonisamide 100 MG capsule Commonly known as: ZONEGRAN Take 400 mg by mouth daily.        Allergies:  Allergies  Allergen Reactions   Sulfa Antibiotics Hives and Rash    Family History: Family History  Family history unknown: Yes    Social History:  reports that he quit smoking about 39 years ago. His smoking use included cigarettes. He smoked an average of .5 packs per day. He has never used smokeless tobacco. He reports current alcohol use of about 1.0 standard drink per week. He reports that he does not use drugs.  ROS: All other review of systems were reviewed and are negative except what is noted above in HPI  Physical Exam: BP (!) 146/87    Pulse 61   Constitutional:  Alert and oriented, No acute distress. HEENT:  AT, moist mucus membranes.  Trachea midline, no masses. Cardiovascular: No clubbing, cyanosis, or edema. Respiratory: Normal respiratory effort, no increased work of breathing. GI: Abdomen is  soft, nontender, nondistended, no abdominal masses GU: No CVA tenderness.  Lymph: No cervical or inguinal lymphadenopathy. Skin: No rashes, bruises or suspicious lesions. Neurologic: Grossly intact, no focal deficits, moving all 4 extremities. Psychiatric: Normal mood and affect.  Laboratory Data: Lab Results  Component Value Date   WBC 7.8 06/14/2021   HGB 12.0 (L) 06/14/2021   HCT 35.9 (L) 06/14/2021   MCV 93.7 06/14/2021   PLT 182 06/14/2021    Lab Results  Component Value Date   CREATININE 0.97 06/14/2021    No results found for: PSA  No results found for: TESTOSTERONE  No results found for: HGBA1C  Urinalysis    Component Value Date/Time   COLORURINE  AMBER (A) 06/14/2021 0640   APPEARANCEUR CLOUDY (A) 06/14/2021 0640   APPEARANCEUR Clear 02/04/2021 1234   LABSPEC 1.013 06/14/2021 0640   PHURINE 7.0 06/14/2021 [place] 06/14/2021 0640   HGBUR LARGE (A) 06/14/2021 0640   BILIRUBINUR NEGATIVE 06/14/2021 0640   BILIRUBINUR Negative 02/04/2021 1234   [place] 06/14/2021 0640   PROTEINUR 100 (A) 06/14/2021 0640   NITRITE NEGATIVE 06/14/2021 0640   LEUKOCYTESUR MODERATE (A) 06/14/2021 0640    Lab Results  Component Value Date   LABMICR See below: 02/04/2021   WBCUA 0-5 02/04/2021   LABEPIT 0-10 02/04/2021   MUCUS Present 02/04/2021   BACTERIA NONE SEEN 06/14/2021    Pertinent Imaging:  No results found for this or any previous visit.  No results found for this or any previous visit.  No results found for this or any previous visit.  No results found for this or any previous visit.  No results found for this or any previous visit.  No results found for this or any previous visit.  No results found for this or any previous visit.  Results for orders placed during the hospital encounter of 06/13/21  CT Renal Stone Study  Narrative CLINICAL DATA:  Gross hematuria.  EXAM: CT ABDOMEN AND PELVIS WITHOUT CONTRAST  TECHNIQUE: Multidetector CT imaging of the abdomen and pelvis was performed following the standard protocol without IV contrast.  COMPARISON:  May 11, 2020.  FINDINGS: Lower chest: No acute abnormality.  Hepatobiliary: No focal liver abnormality is seen. Status post cholecystectomy. No biliary dilatation.  Pancreas: Unremarkable. No pancreatic ductal dilatation or surrounding inflammatory changes.  Spleen: Normal in size without focal abnormality.  Adrenals/Urinary Tract: Adrenal glands appear normal. Nonobstructive left nephrolithiasis is noted. Minimal bilateral hydroureteronephrosis is noted without evidence of obstructing calculus. Multiple layering calculi are  noted posteriorly in the urinary bladder. Foley catheter appears to be inflated within the penile portion of the urethra.  Stomach/Bowel: Stomach is within normal limits. Appendix appears normal. No evidence of bowel wall thickening, distention, or inflammatory changes.  Vascular/Lymphatic: Aortic atherosclerosis. No enlarged abdominal or pelvic lymph nodes.  Reproductive: Stable mild prostatic enlargement is noted. Prostatic calcifications are noted.  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 Stable small periumbilical hernia is noted which contains a loop of small bowel, but does not result in obstruction.  Musculoskeletal: No acute or significant osseous findings.  IMPRESSION: Foley catheter appears to be inflated within the penile urethra; repositioning is recommended. Critical Value/emergent results were called by telephone at the time of interpretation on 06/13/2021 at 2:41 pm to provider [facility] , who verbally acknowledged these results.  Nonobstructive left nephrolithiasis.  Mild bilateral hydroureteronephrosis is noted without evidence of obstructing ureteral calculus.  Mild layering calculi are noted posteriorly in  the dependent portion of the urinary bladder.  Stable mild prostatic enlargement.  Aortic Atherosclerosis (ICD10-I70.0).   Electronically Signed By: Marijo Conception M.D. On: 06/13/2021 14:42   Assessment & Plan:    1. Gross hematuria -Continue finasteride - Urinalysis, Routine w reflex microscopic  2. Incomplete emptying of bladder and nocturia -We will start uroxatral 10mg  qhs - BLADDER SCAN AMB NON-IMAGING  3. Benign prostatic hyperplasia with urinary obstruction -Uroxatral 10mg  qhs   No follow-ups on file.  Nicolette Bang, MD  [facility] [place]

## 2021-08-29 DIAGNOSIS — H40013 Open angle with borderline findings, low risk, bilateral: Secondary | ICD-10-CM | POA: Diagnosis not present

## 2021-08-29 DIAGNOSIS — H538 Other visual disturbances: Secondary | ICD-10-CM | POA: Diagnosis not present

## 2021-09-15 DIAGNOSIS — E559 Vitamin D deficiency, unspecified: Secondary | ICD-10-CM | POA: Diagnosis not present

## 2021-09-15 DIAGNOSIS — G40309 Generalized idiopathic epilepsy and epileptic syndromes, not intractable, without status epilepticus: Secondary | ICD-10-CM | POA: Diagnosis not present

## 2021-09-15 DIAGNOSIS — Z125 Encounter for screening for malignant neoplasm of prostate: Secondary | ICD-10-CM | POA: Diagnosis not present

## 2021-09-15 DIAGNOSIS — Z13220 Encounter for screening for lipoid disorders: Secondary | ICD-10-CM | POA: Diagnosis not present

## 2021-09-15 DIAGNOSIS — R1011 Right upper quadrant pain: Secondary | ICD-10-CM | POA: Diagnosis not present

## 2021-09-15 DIAGNOSIS — R739 Hyperglycemia, unspecified: Secondary | ICD-10-CM | POA: Diagnosis not present

## 2021-09-20 ENCOUNTER — Ambulatory Visit: Payer: TRICARE For Life (TFL) | Admitting: Urology

## 2021-09-26 DIAGNOSIS — G40309 Generalized idiopathic epilepsy and epileptic syndromes, not intractable, without status epilepticus: Secondary | ICD-10-CM | POA: Diagnosis not present

## 2021-09-26 DIAGNOSIS — E039 Hypothyroidism, unspecified: Secondary | ICD-10-CM | POA: Diagnosis not present

## 2021-09-26 DIAGNOSIS — Z6828 Body mass index (BMI) 28.0-28.9, adult: Secondary | ICD-10-CM | POA: Diagnosis not present

## 2021-09-26 DIAGNOSIS — L719 Rosacea, unspecified: Secondary | ICD-10-CM | POA: Diagnosis not present

## 2021-09-26 DIAGNOSIS — M1712 Unilateral primary osteoarthritis, left knee: Secondary | ICD-10-CM | POA: Diagnosis not present

## 2021-09-26 DIAGNOSIS — M19011 Primary osteoarthritis, right shoulder: Secondary | ICD-10-CM | POA: Diagnosis not present

## 2021-11-06 DIAGNOSIS — R062 Wheezing: Secondary | ICD-10-CM | POA: Diagnosis not present

## 2021-11-06 DIAGNOSIS — Z20822 Contact with and (suspected) exposure to covid-19: Secondary | ICD-10-CM | POA: Diagnosis not present

## 2021-11-06 DIAGNOSIS — J069 Acute upper respiratory infection, unspecified: Secondary | ICD-10-CM | POA: Diagnosis not present

## 2021-12-12 ENCOUNTER — Ambulatory Visit (INDEPENDENT_AMBULATORY_CARE_PROVIDER_SITE_OTHER): Payer: Medicare Other | Admitting: Urology

## 2021-12-12 ENCOUNTER — Encounter: Payer: Self-pay | Admitting: Urology

## 2021-12-12 VITALS — BP 166/81 | HR 66

## 2021-12-12 DIAGNOSIS — I10 Essential (primary) hypertension: Secondary | ICD-10-CM | POA: Diagnosis not present

## 2021-12-12 DIAGNOSIS — R310 Gross hematuria: Secondary | ICD-10-CM | POA: Diagnosis not present

## 2021-12-12 DIAGNOSIS — R339 Retention of urine, unspecified: Secondary | ICD-10-CM

## 2021-12-12 DIAGNOSIS — G40309 Generalized idiopathic epilepsy and epileptic syndromes, not intractable, without status epilepticus: Secondary | ICD-10-CM | POA: Diagnosis not present

## 2021-12-12 DIAGNOSIS — M1712 Unilateral primary osteoarthritis, left knee: Secondary | ICD-10-CM | POA: Diagnosis not present

## 2021-12-12 DIAGNOSIS — N401 Enlarged prostate with lower urinary tract symptoms: Secondary | ICD-10-CM

## 2021-12-12 DIAGNOSIS — N138 Other obstructive and reflux uropathy: Secondary | ICD-10-CM | POA: Diagnosis not present

## 2021-12-12 DIAGNOSIS — Z6827 Body mass index (BMI) 27.0-27.9, adult: Secondary | ICD-10-CM | POA: Diagnosis not present

## 2021-12-12 DIAGNOSIS — E039 Hypothyroidism, unspecified: Secondary | ICD-10-CM | POA: Diagnosis not present

## 2021-12-12 LAB — URINALYSIS, ROUTINE W REFLEX MICROSCOPIC
Bilirubin, UA: NEGATIVE
Glucose, UA: NEGATIVE
Ketones, UA: NEGATIVE
Nitrite, UA: NEGATIVE
Protein,UA: NEGATIVE
RBC, UA: NEGATIVE
Specific Gravity, UA: 1.015 (ref 1.005–1.030)
Urobilinogen, Ur: 0.2 mg/dL (ref 0.2–1.0)
pH, UA: 7 (ref 5.0–7.5)

## 2021-12-12 LAB — MICROSCOPIC EXAMINATION
RBC, Urine: NONE SEEN /hpf (ref 0–2)
Renal Epithel, UA: NONE SEEN /hpf

## 2021-12-12 MED ORDER — ALFUZOSIN HCL ER 10 MG PO TB24: 10.0000 mg | ORAL_TABLET | Freq: Every day | ORAL | 3 refills | Status: DC

## 2021-12-12 MED ORDER — FINASTERIDE 5 MG PO TABS: 5.0000 mg | ORAL_TABLET | Freq: Every day | ORAL | 3 refills | Status: AC

## 2021-12-12 NOTE — Progress Notes (Signed)
12/12/2021 11:14 AM   [name] [birth year] [id number]  Referring provider: Practice, [facility] [place],  [place] [address]  Followup BPH with incomplete emptying   HPI: [name] is a 81yo here for followup for BPh with incomplete emptying. He had an episode of gross painless hematuria 1-2 weeks ago after straining during coughing. Nocturia 2-3x. IPSS 12 QOl 2. Uirne stream stronger on uroxatral '10mg'$  qhs. NO feeling of incomplete emptying. No other complaints today.    PMH: Past Medical History:  Diagnosis Date   History of colon polyps    removed with colonoscopy   Kidney stone    in past-has one at present, not bothersome   Seizures ([place])    none in 10 yrs- controlled with meds(Dr. Ned Clines [phone])    Surgical History: Past Surgical History:  Procedure Laterality Date   CHOLECYSTECTOMY  07/25/2012   Procedure: LAPAROSCOPIC CHOLECYSTECTOMY WITH INTRAOPERATIVE CHOLANGIOGRAM;  Surgeon: Ralene Ok, MD;  Location: [facility];  Service: General;;   COLONOSCOPY  11/15/2011   Procedure: COLONOSCOPY;  Surgeon: Rogene Houston, MD;  Location: [facility];  Service: Endoscopy;  Laterality: N/A;  730   ELBOW SURGERY     chipped bone right elbow   TONSILLECTOMY     VASECTOMY      Home Medications:  Allergies as of 12/12/2021       Reactions   Sulfa Antibiotics Hives, Rash        Medication List        Accurate as of Dec 12, 2021 11:14 AM. If you have any questions, ask your nurse or doctor.          alfuzosin 10 MG 24 hr tablet Commonly known as: UROXATRAL Take 1 tablet (10 mg total) by mouth daily with breakfast.   aspirin EC 81 MG tablet Take 81 mg by mouth daily. Swallow whole.   Centrum Silver Adult 50+ Tabs Take 1 tablet by mouth daily.   finasteride 5 MG tablet Commonly known as: Proscar Take 1 tablet (5 mg total) by mouth daily.   metroNIDAZOLE 1 % gel Commonly known as: METROGEL Apply 1 application topically daily as  needed (Rosacea). Apply to face   Oxcarbazepine 300 MG tablet Commonly known as: TRILEPTAL Take 300 mg by mouth 2 (two) times daily.   sodium chloride 0.65 % nasal spray Commonly known as: OCEAN Place 1 spray into the nose daily as needed for congestion.   Synthroid 50 MCG tablet Generic drug: levothyroxine Take 50 mcg by mouth daily.   tamsulosin 0.4 MG Caps capsule Commonly known as: FLOMAX Take 1 capsule (0.4 mg total) by mouth at bedtime.   zonisamide 100 MG capsule Commonly known as: ZONEGRAN Take 400 mg by mouth daily.        Allergies:  Allergies  Allergen Reactions   Sulfa Antibiotics Hives and Rash    Family History: Family History  Family history unknown: Yes    Social History:  reports that he quit smoking about 39 years ago. His smoking use included cigarettes. He smoked an average of .5 packs per day. He has never used smokeless tobacco. He reports current alcohol use of about 1.0 standard drink per week. He reports that he does not use drugs.  ROS: All other review of systems were reviewed and are negative except what is noted above in HPI  Physical Exam: BP (!) 166/81   Pulse 66   Constitutional:  Alert and oriented, No acute distress. HEENT:  [place] AT, moist mucus membranes.  Trachea midline, no masses. Cardiovascular: No clubbing, cyanosis, or edema. Respiratory: Normal respiratory effort, no increased work of breathing. GI: Abdomen is soft, nontender, nondistended, no abdominal masses GU: No CVA tenderness.  Lymph: No cervical or inguinal lymphadenopathy. Skin: No rashes, bruises or suspicious lesions. Neurologic: Grossly intact, no focal deficits, moving all 4 extremities. Psychiatric: Normal mood and affect.  Laboratory Data: Lab Results  Component Value Date   WBC 7.8 06/14/2021   HGB 12.0 (L) 06/14/2021   HCT 35.9 (L) 06/14/2021   MCV 93.7 06/14/2021   PLT 182 06/14/2021    Lab Results  Component Value Date   CREATININE 0.97  06/14/2021    No results found for: PSA  No results found for: TESTOSTERONE  No results found for: HGBA1C  Urinalysis    Component Value Date/Time   COLORURINE AMBER (A) 06/14/2021 0640   APPEARANCEUR Clear 08/12/2021 1030   LABSPEC 1.013 06/14/2021 0640   PHURINE 7.0 06/14/2021 0640   GLUCOSEU Negative 08/12/2021 1030   HGBUR LARGE (A) 06/14/2021 0640   BILIRUBINUR Negative 08/12/2021 1030   KETONESUR NEGATIVE 06/14/2021 0640   PROTEINUR Negative 08/12/2021 1030   PROTEINUR 100 (A) 06/14/2021 0640   NITRITE Negative 08/12/2021 1030   NITRITE NEGATIVE 06/14/2021 0640   LEUKOCYTESUR 2+ (A) 08/12/2021 1030   LEUKOCYTESUR MODERATE (A) 06/14/2021 0640    Lab Results  Component Value Date   LABMICR See below: 08/12/2021   WBCUA 11-30 (A) 08/12/2021   LABEPIT 0-10 08/12/2021   MUCUS Present 02/04/2021   BACTERIA Few (A) 08/12/2021    Pertinent Imaging:  No results found for this or any previous visit.  No results found for this or any previous visit.  No results found for this or any previous visit.  No results found for this or any previous visit.  No results found for this or any previous visit.  No results found for this or any previous visit.  No results found for this or any previous visit.  Results for orders placed during the hospital encounter of 06/13/21  CT Renal Stone Study  Narrative CLINICAL DATA:  Gross hematuria.  EXAM: CT ABDOMEN AND PELVIS WITHOUT CONTRAST  TECHNIQUE: Multidetector CT imaging of the abdomen and pelvis was performed following the standard protocol without IV contrast.  COMPARISON:  May 11, 2020.  FINDINGS: Lower chest: No acute abnormality.  Hepatobiliary: No focal liver abnormality is seen. Status post cholecystectomy. No biliary dilatation.  Pancreas: Unremarkable. No pancreatic ductal dilatation or surrounding inflammatory changes.  Spleen: Normal in size without focal abnormality.  Adrenals/Urinary  Tract: Adrenal glands appear normal. Nonobstructive left nephrolithiasis is noted. Minimal bilateral hydroureteronephrosis is noted without evidence of obstructing calculus. Multiple layering calculi are noted posteriorly in the urinary bladder. Foley catheter appears to be inflated within the penile portion of the urethra.  Stomach/Bowel: Stomach is within normal limits. Appendix appears normal. No evidence of bowel wall thickening, distention, or inflammatory changes.  Vascular/Lymphatic: Aortic atherosclerosis. No enlarged abdominal or pelvic lymph nodes.  Reproductive: Stable mild prostatic enlargement is noted. Prostatic calcifications are noted.  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 Stable small periumbilical hernia is noted which contains a loop of small bowel, but does not result in obstruction.  Musculoskeletal: No acute or significant osseous findings.  IMPRESSION: Foley catheter appears to be inflated within the penile urethra; repositioning is recommended. Critical Value/emergent results were called by telephone at the time of interpretation on 06/13/2021 at 2:41  pm to provider [facility] , who verbally acknowledged these results.  Nonobstructive left nephrolithiasis.  Mild bilateral hydroureteronephrosis is noted without evidence of obstructing ureteral calculus.  Mild layering calculi are noted posteriorly in the dependent portion of the urinary bladder.  Stable mild prostatic enlargement.  Aortic Atherosclerosis (ICD10-I70.0).   Electronically Signed By: Marijo Conception M.D. On: 06/13/2021 14:42   Assessment & Plan:    1. Gross hematuria -continue finasteride '5mg'$  daily - Urinalysis, Routine w reflex microscopic  2. Benign prostatic hyperplasia with urinary obstruction -continue uroxatral '10mg'$  daily - Urinalysis, Routine w reflex microscopic  3. Incomplete emptying of bladder -continue uroxatral '10mg'$  daily -  Urinalysis, Routine w reflex microscopic   No follow-ups on file.  Nicolette Bang, MD  [facility] [place]

## 2021-12-12 NOTE — Patient Instructions (Signed)

## 2021-12-26 DIAGNOSIS — L570 Actinic keratosis: Secondary | ICD-10-CM | POA: Diagnosis not present

## 2021-12-26 DIAGNOSIS — D0439 Carcinoma in situ of skin of other parts of face: Secondary | ICD-10-CM | POA: Diagnosis not present

## 2021-12-26 DIAGNOSIS — C44329 Squamous cell carcinoma of skin of other parts of face: Secondary | ICD-10-CM | POA: Diagnosis not present

## 2022-01-05 DIAGNOSIS — C44329 Squamous cell carcinoma of skin of other parts of face: Secondary | ICD-10-CM | POA: Diagnosis not present

## 2022-01-18 DIAGNOSIS — H3561 Retinal hemorrhage, right eye: Secondary | ICD-10-CM | POA: Diagnosis not present

## 2022-01-18 DIAGNOSIS — H538 Other visual disturbances: Secondary | ICD-10-CM | POA: Diagnosis not present

## 2022-01-18 DIAGNOSIS — H40013 Open angle with borderline findings, low risk, bilateral: Secondary | ICD-10-CM | POA: Diagnosis not present

## 2022-01-18 DIAGNOSIS — H35033 Hypertensive retinopathy, bilateral: Secondary | ICD-10-CM | POA: Diagnosis not present

## 2022-01-18 DIAGNOSIS — H35372 Puckering of macula, left eye: Secondary | ICD-10-CM | POA: Diagnosis not present

## 2022-02-06 ENCOUNTER — Ambulatory Visit: Payer: TRICARE For Life (TFL) | Admitting: Urology

## 2022-02-07 ENCOUNTER — Ambulatory Visit: Payer: Medicare Other | Admitting: Urology

## 2022-03-24 DIAGNOSIS — R739 Hyperglycemia, unspecified: Secondary | ICD-10-CM | POA: Diagnosis not present

## 2022-03-24 DIAGNOSIS — E039 Hypothyroidism, unspecified: Secondary | ICD-10-CM | POA: Diagnosis not present

## 2022-03-24 DIAGNOSIS — Z13220 Encounter for screening for lipoid disorders: Secondary | ICD-10-CM | POA: Diagnosis not present

## 2022-03-24 DIAGNOSIS — E559 Vitamin D deficiency, unspecified: Secondary | ICD-10-CM | POA: Diagnosis not present

## 2022-03-30 DIAGNOSIS — M1712 Unilateral primary osteoarthritis, left knee: Secondary | ICD-10-CM | POA: Diagnosis not present

## 2022-03-30 DIAGNOSIS — G40309 Generalized idiopathic epilepsy and epileptic syndromes, not intractable, without status epilepticus: Secondary | ICD-10-CM | POA: Diagnosis not present

## 2022-03-30 DIAGNOSIS — E039 Hypothyroidism, unspecified: Secondary | ICD-10-CM | POA: Diagnosis not present

## 2022-03-30 DIAGNOSIS — Z800 Family history of malignant neoplasm of digestive organs: Secondary | ICD-10-CM | POA: Diagnosis not present

## 2022-03-30 DIAGNOSIS — Z23 Encounter for immunization: Secondary | ICD-10-CM | POA: Diagnosis not present

## 2022-03-30 DIAGNOSIS — Z0001 Encounter for general adult medical examination with abnormal findings: Secondary | ICD-10-CM | POA: Diagnosis not present

## 2022-03-30 DIAGNOSIS — Z6827 Body mass index (BMI) 27.0-27.9, adult: Secondary | ICD-10-CM | POA: Diagnosis not present

## 2022-03-30 DIAGNOSIS — R030 Elevated blood-pressure reading, without diagnosis of hypertension: Secondary | ICD-10-CM | POA: Diagnosis not present

## 2022-03-31 ENCOUNTER — Encounter (INDEPENDENT_AMBULATORY_CARE_PROVIDER_SITE_OTHER): Payer: Self-pay | Admitting: *Deleted

## 2022-04-16 ENCOUNTER — Emergency Department (HOSPITAL_COMMUNITY)
Admission: EM | Admit: 2022-04-16 | Discharge: 2022-04-16 | Disposition: A | Discharge status: Home / Self Care | Payer: Medicare Other | Attending: Emergency Medicine | Admitting: Emergency Medicine

## 2022-04-16 ENCOUNTER — Encounter (HOSPITAL_COMMUNITY): Payer: Self-pay

## 2022-04-16 DIAGNOSIS — R310 Gross hematuria: Secondary | ICD-10-CM | POA: Diagnosis not present

## 2022-04-16 DIAGNOSIS — R109 Unspecified abdominal pain: Secondary | ICD-10-CM | POA: Diagnosis not present

## 2022-04-16 DIAGNOSIS — Z87898 Personal history of other specified conditions: Secondary | ICD-10-CM

## 2022-04-16 DIAGNOSIS — Z7982 Long term (current) use of aspirin: Secondary | ICD-10-CM | POA: Insufficient documentation

## 2022-04-16 DIAGNOSIS — R339 Retention of urine, unspecified: Secondary | ICD-10-CM | POA: Insufficient documentation

## 2022-04-16 DIAGNOSIS — R319 Hematuria, unspecified: Secondary | ICD-10-CM | POA: Insufficient documentation

## 2022-04-16 LAB — CBC WITH DIFFERENTIAL/PLATELET
Abs Immature Granulocytes: 0.03 10*3/uL (ref 0.00–0.07)
Basophils Absolute: 0 10*3/uL (ref 0.0–0.1)
Basophils Relative: 1 %
Eosinophils Absolute: 0.1 10*3/uL (ref 0.0–0.5)
Eosinophils Relative: 2 %
HCT: 39.9 % (ref 39.0–52.0)
Hemoglobin: 13.2 g/dL (ref 13.0–17.0)
Immature Granulocytes: 1 %
Lymphocytes Relative: 24 %
Lymphs Abs: 1.4 10*3/uL (ref 0.7–4.0)
MCH: 31.2 pg (ref 26.0–34.0)
MCHC: 33.1 g/dL (ref 30.0–36.0)
MCV: 94.3 fL (ref 80.0–100.0)
Monocytes Absolute: 0.5 10*3/uL (ref 0.1–1.0)
Monocytes Relative: 9 %
Neutro Abs: 3.8 10*3/uL (ref 1.7–7.7)
Neutrophils Relative %: 63 %
Platelets: 207 10*3/uL (ref 150–400)
RBC: 4.23 MIL/uL (ref 4.22–5.81)
RDW: 12.7 % (ref 11.5–15.5)
WBC: 5.9 10*3/uL (ref 4.0–10.5)
nRBC: 0 % (ref 0.0–0.2)

## 2022-04-16 LAB — URINALYSIS, ROUTINE W REFLEX MICROSCOPIC

## 2022-04-16 LAB — COMPREHENSIVE METABOLIC PANEL
ALT: 15 U/L (ref 0–44)
AST: 23 U/L (ref 15–41)
Albumin: 4.5 g/dL (ref 3.5–5.0)
Alkaline Phosphatase: 79 U/L (ref 38–126)
Anion gap: 5 (ref 5–15)
BUN: 17 mg/dL (ref 8–23)
CO2: 26 mmol/L (ref 22–32)
Calcium: 9.8 mg/dL (ref 8.9–10.3)
Chloride: 106 mmol/L (ref 98–111)
Creatinine, Ser: 1.07 mg/dL (ref 0.61–1.24)
GFR, Estimated: >60 mL/min (ref >60)
Glucose, Bld: 94 mg/dL (ref 70–99)
Potassium: 4 mmol/L (ref 3.5–5.1)
Sodium: 137 mmol/L (ref 135–145)
Total Bilirubin: 0.5 mg/dL (ref 0.3–1.2)
Total Protein: 7.1 g/dL (ref 6.5–8.1)

## 2022-04-16 LAB — URINALYSIS, MICROSCOPIC (REFLEX)
RBC / HPF: >50 RBC/hpf (ref 0–5)
Squamous Epithelial / HPF: NONE SEEN (ref 0–5)

## 2022-04-16 NOTE — ED Provider Notes (Signed)
[facility] DEPT Provider Note   CSN: [id number] Arrival date & time: 04/16/22  1041     History  Chief Complaint  Patient presents with   Hematuria    [name] is a 81 y.o. male.   Hematuria   81 year old male presents emergency department with complaints of hematuria and passing blood clots in his urine.  Patient states that symptoms began this past Saturday.  He states that Friday he did more intense physical activity of cleaning his car and surrounding areas.  He felt like he had more joint pain than normal and took 2 Aleve before going to bed.  Saturday morning he woke up and had episodes of hematuria.  He notes persistence of symptoms since onset.  He has history of symptoms similar and has had to be admitted for urinary retention for CBI.  He currently has no indwelling Foley catheter placement.  He was told by his urologist to come to the emergency department if episodes of hematuria occurred and did not resolve.  He currently is complaining of no abdominal or flank pain.  He is concerned about passing clots and potential urinary retention.  Denies fever, chills, night sweats, chest pain, shortness of breath, nausea, vomiting, abdominal pain, dysuria, changes in bowel habits.  Denies current blood thinner use but takes daily aspirin.  Past medical history significant for BPH, nephrolithiasis, gross hematuria  Home Medications Prior to Admission medications   Medication Sig Start Date End Date Taking? Authorizing Provider  alfuzosin (UROXATRAL) 10 MG 24 hr tablet Take 1 tablet (10 mg total) by mouth daily with breakfast. 12/12/21   McKenzie, Candee Furbish, MD  aspirin EC 81 MG tablet Take 81 mg by mouth daily. Swallow whole.    [provider]  finasteride (PROSCAR) 5 MG tablet Take 1 tablet (5 mg total) by mouth daily. 12/12/21 12/12/22  Cleon Gustin, MD  metroNIDAZOLE (METROGEL) 1 % gel Apply 1 application topically daily as needed  (Rosacea). Apply to face    [provider]  Multiple Vitamins-Minerals (CENTRUM SILVER ADULT 50+) TABS Take 1 tablet by mouth daily.    [provider]  Oxcarbazepine (TRILEPTAL) 300 MG tablet Take 300 mg by mouth 2 (two) times daily.    [provider]  sodium chloride (OCEAN) 0.65 % nasal spray Place 1 spray into the nose daily as needed for congestion.    [provider]  SYNTHROID 50 MCG tablet Take 50 mcg by mouth daily. 06/08/20   [provider]  zonisamide (ZONEGRAN) 100 MG capsule Take 400 mg by mouth daily.    [provider]      Allergies    Sulfa antibiotics    Review of Systems   Review of Systems  Genitourinary:  Positive for hematuria.    Physical Exam Updated Vital Signs BP (!) 153/84   Pulse 63   Temp (!) 97.5 F (36.4 C) (Oral)   Resp 18   SpO2 98%  Physical Exam Vitals and nursing note reviewed.  Constitutional:      General: He is not in acute distress.    Appearance: He is well-developed.  HENT:     Head: Normocephalic and atraumatic.  Eyes:     Conjunctiva/sclera: Conjunctivae normal.  Cardiovascular:     Rate and Rhythm: Normal rate and regular rhythm.     Heart sounds: No murmur heard. Pulmonary:     Effort: Pulmonary effort is normal. No respiratory distress.  Breath sounds: Normal breath sounds.  Abdominal:     Palpations: Abdomen is soft.     Tenderness: There is no abdominal tenderness. There is no right CVA tenderness or left CVA tenderness.  Musculoskeletal:        General: No swelling.     Cervical back: Neck supple.  Skin:    General: Skin is warm and dry.     Capillary Refill: Capillary refill takes less than 2 seconds.  Neurological:     Mental Status: He is alert.  Psychiatric:        Mood and Affect: Mood normal.     ED Results / Procedures / Treatments   Labs (all labs ordered are listed, but only abnormal results are displayed) Labs Reviewed  URINALYSIS, ROUTINE  W REFLEX MICROSCOPIC - Abnormal; Notable for the following components:      Result Value   Color, Urine RED (*)    APPearance TURBID (*)    Glucose, UA   (*)    Value: TEST NOT REPORTED DUE TO COLOR INTERFERENCE OF URINE PIGMENT   Hgb urine dipstick   (*)    Value: TEST NOT REPORTED DUE TO COLOR INTERFERENCE OF URINE PIGMENT   Bilirubin Urine   (*)    Value: TEST NOT REPORTED DUE TO COLOR INTERFERENCE OF URINE PIGMENT   Ketones, ur   (*)    Value: TEST NOT REPORTED DUE TO COLOR INTERFERENCE OF URINE PIGMENT   Protein, ur   (*)    Value: TEST NOT REPORTED DUE TO COLOR INTERFERENCE OF URINE PIGMENT   Nitrite   (*)    Value: TEST NOT REPORTED DUE TO COLOR INTERFERENCE OF URINE PIGMENT   Leukocytes,Ua   (*)    Value: TEST NOT REPORTED DUE TO COLOR INTERFERENCE OF URINE PIGMENT   All other components within normal limits  URINALYSIS, MICROSCOPIC (REFLEX) - Abnormal; Notable for the following components:   Bacteria, UA RARE (*)    All other components within normal limits  URINE CULTURE  CBC WITH DIFFERENTIAL/PLATELET  COMPREHENSIVE METABOLIC PANEL    EKG None  Radiology No results found.  Procedures Procedures    Medications Ordered in ED Medications - No data to display  ED Course/ Medical Decision Making/ A&P Clinical Course as of 04/17/22 1050  Sun Apr 16, 2022  1655 Order for post void residual placed [AC]  1905 Dr. Jeffie Pollock returned page from Urology, consulted with regards to pt's case and presentation.  Agreed with recent Urologist's recommendation, and added that if urinary retention is remarkable, may consider overnight admission for urology to follow up with pt first thing in the morning.  PVR still pending. [AC]  1956 PVR of 80 [AC]    Clinical Course User Index [AC] Prince Rome, PA-C                           Medical Decision Making Amount and/or Complexity of Data Reviewed Labs: ordered.   This patient presents to the ED for concern of hematuria,  this involves an extensive number of treatment options, and is a complaint that carries with it a high risk of complications and morbidity.  The differential diagnosis includes malignancy, nephrolithiasis, hemorrhagic cystitis,   Co morbidities that complicate the patient evaluation  See HPI   Additional history obtained:  Additional history obtained from EMR External records from outside source obtained and reviewed including prior urologic visit 12/12/2021 indicating patient's medications.  Lab Tests:  I Ordered, and personally interpreted labs.  The pertinent results include: No leukocytosis noted.  No evidence of UTI.  Platelets within normal range.  No electrolyte abnormalities.  Renal function within normal limits.  No transaminitis noted.  UA significant for greater than 50 RBC with rare bacteria.  Otherwise indeterminable secondary to amount of blood.   Imaging Studies ordered:  I ordered imaging studies including postvoid residual which is pending upon shift change.   Cardiac Monitoring: / EKG:  The patient was maintained on a cardiac monitor.  I personally viewed and interpreted the cardiac monitored which showed an underlying rhythm of: Sinus rhythm   Consultations Obtained:  Urology was consulted x2 before shift change bed and called back; still pending at this time.  Problem List / ED Course / Critical interventions / Medication management  Asymptomatic hematuria Reevaluation of the patient showed that the patient improved I have reviewed the patients home medicines and have made adjustments as needed   Social Determinants of Health:  Former tobacco use quit in 1983.  Denies illicit drug use.   Test / Admission - Considered:  Asymptomatic hematuria Vitals signs significant for mild hypertension with blood pressure of 139/90.  Recommend close follow-up with PCP regarding elevated blood pressure.. Otherwise within normal range and stable throughout  visit. Laboratory/imaging studies significant for: See above Patient presentation with asymptomatic hematuria.  Postvoid residual still pending at this time to determine whether or not patient is truly retaining urine.  Family concerned given prior episodes of urinary retention secondary to gross hematuria with associated clots.  Urology was consulted regarding the patient and the still pending at this time.  At shift change, patient care handed off to [facility], [place].  Patient stable upon shift change.        Final Clinical Impression(s) / ED Diagnoses Final diagnoses:  Hematuria, unspecified type  History of urinary retention    Rx / DC Orders ED Discharge Orders     None         Wilnette Kales, [place] 04/17/22 1050    Hayden Rasmussen, MD 04/17/22 1105

## 2022-04-16 NOTE — ED Provider Triage Note (Addendum)
Emergency Medicine Provider Triage Evaluation Note  [name] , a 81 y.o. male  was evaluated in triage.  Pt complains of hematuria which began yesterday.  History of BPH. Patient reports this is a recurrent problem, last happened a couple of months ago, he did follow-up with Dr. Alyson Ingles and was given finasteride, and alfuzosin.  He was told that the hematuria return again he will need to be seen in the emergency department.  He reports this is happening intermittently, there is no dysuria associated.  He is currently on no blood thinners but does take a daily aspirin.  No fever, no abdominal pain, no trauma.  Review of Systems  Positive: hematuria Negative: Fever, abdominal pain, flank pain  Physical Exam  There were no vitals taken for this visit. Gen:   Awake, no distress   Resp:  Normal effort  MSK:   Moves extremities without difficulty  Other:    Medical Decision Making  Medically screening exam initiated at 10:47 AM.  Appropriate orders placed.  Dorene Grebe Brassell was informed that the remainder of the evaluation will be completed by another provider, this initial triage assessment does not replace that evaluation, and the importance of remaining in the ED until their evaluation is complete.     Janeece Fitting, PA-C 04/16/22 [place], Antonio Woodhams, PA-C 04/16/22 1058

## 2022-04-16 NOTE — ED Provider Notes (Signed)
  Physical Exam  BP (!) 153/84   Pulse 63   Temp (!) 97.5 F (36.4 C) (Oral)   Resp 18   SpO2 98%   Physical Exam Vitals and nursing note reviewed.  Constitutional:      General: He is not in acute distress.    Appearance: He is not ill-appearing.  HENT:     Head: Normocephalic and atraumatic.  Cardiovascular:     Rate and Rhythm: Normal rate.  Abdominal:     Tenderness: There is no abdominal tenderness.  Skin:    Coloration: Skin is not jaundiced or pale.  Neurological:     Mental Status: He is oriented to person, place, and time.     Procedures  Procedures  ED Course / MDM   Clinical Course as of 04/16/22 1958  Sun Apr 16, 2022  1655 Order for post void residual placed [AC]  1905 Dr. Jeffie Pollock returned page from Urology, consulted with regards to pt's case and presentation.  Agreed with recent Urologist's recommendation, and added that if urinary retention is remarkable, may consider overnight admission for urology to follow up with pt first thing in the morning.  PVR still pending. [AC]  1956 PVR of 80 [AC]    Clinical Course User Index [AC] Prince Rome, PA-C   Medical Decision Making Amount and/or Complexity of Data Reviewed Labs: ordered.   Patient signed out to me at shift change.  Please see previous provider note for further details.  In short, this is a 81 yom with 2 days hematuria, passing clots in the urine.  No pain or other urinary symptoms.  Hx of this prior and Hx of urinary retention.  Not on anticoagulation, takes bASA daily.  Had some joint pain 3 nights ago, took 2 NSAIDs, then began passing blood clots next day.  Concerned of clots may cause obstruction/urinary retention.  Pt requesting to follow up closely with urology in 1-2 days, lives quite far away.  Post void residual pending.  Pending consult with urology.  Urine culture in process.  Consulted with Dr. Cheri Rous and Dr. Jeffie Pollock of urology.  Discussed patient case in detail.  Recommended close  follow-up with his urologist Dr. Alyson Ingles within the next 1 to 2 days.  Possibility of catheter placement dependent on PVR.  If greater than 200-250 cc retained, recommend placement of 22 French catheter.  May consider overnight observation with urology follow up in morning at that time if needed.  However if less than that retained, may follow-up in 1-2 days without the use of catheter placement.  PVR indicates 80 retained.  Use of urinary catheter is/not indicated at this time.  Recommend close follow-up with Dr. Noland Fordyce office.  Strict return precautions discussed.  Patient in NAD and good condition at time of discharge.  Appears stable for discharge.  I discussed this case with attending physician Dr. Melina Copa, who agreed with the proposed treatment course and cosigned this note including patient's presenting symptoms, physical exam, and planned diagnostics and interventions.  Attending physician stated agreement with plan or made changes to plan which were implemented.     This chart was dictated using voice recognition software.  Despite best efforts to proofread, errors can occur which can change the documentation meaning.    Candace Cruise 24/40/10 2119    Hayden Rasmussen, MD 04/22/22 1159

## 2022-04-16 NOTE — ED Triage Notes (Signed)
Pt arrived via POV, c/o hematuria. Hx of same, denies any other associated sx.

## 2022-04-16 NOTE — Discharge Instructions (Addendum)
Please call first thing tomorrow morning to follow-up with Dr. Noland Fordyce office within the next 1 to 2 days.  Return to the ED for any new or worsening symptoms as discussed.

## 2022-04-16 NOTE — ED Notes (Signed)
Post-void residual showed 2m on first scan and 834mon second scan. PA notified.

## 2022-04-17 ENCOUNTER — Ambulatory Visit (INDEPENDENT_AMBULATORY_CARE_PROVIDER_SITE_OTHER): Payer: Medicare Other | Admitting: Physician Assistant

## 2022-04-17 VITALS — BP 138/76 | HR 71

## 2022-04-17 DIAGNOSIS — R310 Gross hematuria: Secondary | ICD-10-CM

## 2022-04-17 LAB — BLADDER SCAN AMB NON-IMAGING: Scan Result: 61

## 2022-04-17 LAB — URINE CULTURE: Culture: <10000 — AB

## 2022-04-17 NOTE — Progress Notes (Unsigned)
Assessment: 1. Gross hematuria - Urinalysis, Routine w reflex microscopic - BLADDER SCAN AMB NON-IMAGING    Plan: Pt advised to avoid NSAIDs, especially if having ETOH. Pt will continue daily ASA. Culture result pending from ED visit yesterday. FU in 2 to 3 weeks for recheck urine or sooner if symptoms recur.  Routine follow-up with Dr. Alyson Ingles already scheduled.  Chief Complaint: No chief complaint on file.   HPI: [name] is a 81 y.o. male with history of BPH, incomplete emptying, painless gross hematuria who presents for evaluation of recurrence of gross hematuria x 2 episodes this past week. Pt reports both episodes occurred after physical activity, drinking alcohol, then taking 2 over-the-counter Aleve for joint pains due to the extra activity.  Some clots were noted yesterday and the patient went to the emergency department at [facility] for evaluation with concern of possible retention which he has had in the past.  Since hospital discharge, hematuria has essentially cleared.  He denies dysuria, burning, urgency, hesitancy.  No frequency.  No incomplete emptying, hesitancy.  He denies abdominal pain, fever, chills, symptoms of illness. PVR = 61 mL UA= 6-10WBC, >30RBC, no bacteria IPSS= 16          QOL=2  12/12/21 [name] is a 81yo here for followup for BPh with incomplete emptying. He had an episode of gross painless hematuria 1-2 weeks ago after straining during coughing. Nocturia 2-3x. IPSS 12 QOl 2. Uirne stream stronger on uroxatral '10mg'$  qhs. NO feeling of incomplete emptying. No other complaints today.   Portions of the above documentation were copied from a prior visit for review purposes only.  Allergies: Allergies  Allergen Reactions   Sulfa Antibiotics Hives and Rash    PMH: Past Medical History:  Diagnosis Date   History of colon polyps    removed with colonoscopy   Kidney stone    in past-has one at present, not bothersome   Seizures ([place])     none in 10 yrs- controlled with meds(Dr. Ned Clines [phone])    PSH: Past Surgical History:  Procedure Laterality Date   CHOLECYSTECTOMY  07/25/2012   Procedure: LAPAROSCOPIC CHOLECYSTECTOMY WITH INTRAOPERATIVE CHOLANGIOGRAM;  Surgeon: Ralene Ok, MD;  Location: [facility];  Service: General;;   COLONOSCOPY  11/15/2011   Procedure: COLONOSCOPY;  Surgeon: Rogene Houston, MD;  Location: [facility];  Service: Endoscopy;  Laterality: N/A;  730   ELBOW SURGERY     chipped bone right elbow   TONSILLECTOMY     VASECTOMY      SH: Social History   Tobacco Use   Smoking status: Former    Packs/day: 0.50    Types: Cigarettes    Quit date: 07/19/1982    Years since quitting: 39.7   Smokeless tobacco: Never  Substance Use Topics   Alcohol use: Yes    Alcohol/week: 1.0 standard drink of alcohol    Types: 1 Glasses of wine per week    Comment: mixed drink  once in awhile   Drug use: No    ROS: All other review of systems were reviewed and are negative except what is noted above in HPI  PE: BP 138/76   Pulse 71  GENERAL APPEARANCE:  Well appearing, well developed, well nourished, NAD HEENT:  Atraumatic, normocephalic NECK:  Supple. Trachea midline ABDOMEN:  Soft, non-tender, no masses EXTREMITIES:  Moves all extremities well, without clubbing, cyanosis, or edema NEUROLOGIC:  Alert and oriented x 3 MENTAL STATUS:  appropriate BACK:  Non-tender to palpation, No CVAT SKIN:  Warm, dry, and intact   Results: Laboratory Data: Lab Results  Component Value Date   WBC 5.9 04/16/2022   HGB 13.2 04/16/2022   HCT 39.9 04/16/2022   MCV 94.3 04/16/2022   PLT 207 04/16/2022    Lab Results  Component Value Date   CREATININE 1.07 04/16/2022    No results found for: "PSA"  No results found for: "TESTOSTERONE"  No results found for: "HGBA1C"  Urinalysis    Component Value Date/Time   COLORURINE RED (A) 04/16/2022 1126   APPEARANCEUR TURBID (A) 04/16/2022 1126    APPEARANCEUR Clear 12/12/2021 1144   LABSPEC  04/16/2022 1126    TEST NOT REPORTED DUE TO COLOR INTERFERENCE OF URINE PIGMENT   PHURINE  04/16/2022 1126    TEST NOT REPORTED DUE TO COLOR INTERFERENCE OF URINE PIGMENT   GLUCOSEU (A) 04/16/2022 1126    TEST NOT REPORTED DUE TO COLOR INTERFERENCE OF URINE PIGMENT   HGBUR (A) 04/16/2022 1126    TEST NOT REPORTED DUE TO COLOR INTERFERENCE OF URINE PIGMENT   BILIRUBINUR (A) 04/16/2022 1126    TEST NOT REPORTED DUE TO COLOR INTERFERENCE OF URINE PIGMENT   BILIRUBINUR Negative 12/12/2021 1144   KETONESUR (A) 04/16/2022 1126    TEST NOT REPORTED DUE TO COLOR INTERFERENCE OF URINE PIGMENT   PROTEINUR (A) 04/16/2022 1126    TEST NOT REPORTED DUE TO COLOR INTERFERENCE OF URINE PIGMENT   NITRITE (A) 04/16/2022 1126    TEST NOT REPORTED DUE TO COLOR INTERFERENCE OF URINE PIGMENT   LEUKOCYTESUR (A) 04/16/2022 1126    TEST NOT REPORTED DUE TO COLOR INTERFERENCE OF URINE PIGMENT    Lab Results  Component Value Date   LABMICR See below: 12/12/2021   WBCUA 6-10 (A) 12/12/2021   LABEPIT 0-10 12/12/2021   MUCUS Present 12/12/2021   BACTERIA RARE (A) 04/16/2022    Pertinent Imaging: No results found for this or any previous visit.  No results found for this or any previous visit.  No results found for this or any previous visit.  No results found for this or any previous visit.  No results found for this or any previous visit.  No valid procedures specified. No results found for this or any previous visit.  Results for orders placed during the hospital encounter of 06/13/21  CT Renal Stone Study  Narrative CLINICAL DATA:  Gross hematuria.  EXAM: CT ABDOMEN AND PELVIS WITHOUT CONTRAST  TECHNIQUE: Multidetector CT imaging of the abdomen and pelvis was performed following the standard protocol without IV contrast.  COMPARISON:  May 11, 2020.  FINDINGS: Lower chest: No acute abnormality.  Hepatobiliary: No focal liver  abnormality is seen. Status post cholecystectomy. No biliary dilatation.  Pancreas: Unremarkable. No pancreatic ductal dilatation or surrounding inflammatory changes.  Spleen: Normal in size without focal abnormality.  Adrenals/Urinary Tract: Adrenal glands appear normal. Nonobstructive left nephrolithiasis is noted. Minimal bilateral hydroureteronephrosis is noted without evidence of obstructing calculus. Multiple layering calculi are noted posteriorly in the urinary bladder. Foley catheter appears to be inflated within the penile portion of the urethra.  Stomach/Bowel: Stomach is within normal limits. Appendix appears normal. No evidence of bowel wall thickening, distention, or inflammatory changes.  Vascular/Lymphatic: Aortic atherosclerosis. No enlarged abdominal or pelvic lymph nodes.  Reproductive: Stable mild prostatic enlargement is noted. Prostatic calcifications are noted.  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 Stable small periumbilical hernia is noted which contains a loop of small bowel,  but does not result in obstruction.  Musculoskeletal: No acute or significant osseous findings.  IMPRESSION: Foley catheter appears to be inflated within the penile urethra; repositioning is recommended. Critical Value/emergent results were called by telephone at the time of interpretation on 06/13/2021 at 2:41 pm to provider [facility] , who verbally acknowledged these results.  Nonobstructive left nephrolithiasis.  Mild bilateral hydroureteronephrosis is noted without evidence of obstructing ureteral calculus.  Mild layering calculi are noted posteriorly in the dependent portion of the urinary bladder.  Stable mild prostatic enlargement.  Aortic Atherosclerosis (ICD10-I70.0).   Electronically Signed By: Marijo Conception M.D. On: 06/13/2021 14:42  Results for orders placed or performed in visit on 04/17/22 (from the past 24 hour(s))   BLADDER SCAN AMB NON-IMAGING   Collection Time: 04/17/22  1:34 PM  Result Value Ref Range   Scan Result 61

## 2022-04-17 NOTE — Progress Notes (Unsigned)
post void residual=61 

## 2022-04-18 LAB — MICROSCOPIC EXAMINATION
Bacteria, UA: NONE SEEN
RBC, Urine: >30 /hpf — AB (ref 0–2)

## 2022-04-18 LAB — URINALYSIS, ROUTINE W REFLEX MICROSCOPIC
Bilirubin, UA: NEGATIVE
Glucose, UA: NEGATIVE
Nitrite, UA: NEGATIVE
Specific Gravity, UA: 1.015 (ref 1.005–1.030)
Urobilinogen, Ur: 1 mg/dL (ref 0.2–1.0)
pH, UA: 7 (ref 5.0–7.5)

## 2022-05-10 ENCOUNTER — Other Ambulatory Visit (INDEPENDENT_AMBULATORY_CARE_PROVIDER_SITE_OTHER): Payer: Self-pay

## 2022-05-10 ENCOUNTER — Encounter (INDEPENDENT_AMBULATORY_CARE_PROVIDER_SITE_OTHER): Payer: Self-pay | Admitting: *Deleted

## 2022-05-10 DIAGNOSIS — Z800 Family history of malignant neoplasm of digestive organs: Secondary | ICD-10-CM

## 2022-05-10 DIAGNOSIS — Z1211 Encounter for screening for malignant neoplasm of colon: Secondary | ICD-10-CM

## 2022-05-22 ENCOUNTER — Telehealth (INDEPENDENT_AMBULATORY_CARE_PROVIDER_SITE_OTHER): Payer: Self-pay

## 2022-05-22 ENCOUNTER — Encounter (INDEPENDENT_AMBULATORY_CARE_PROVIDER_SITE_OTHER): Payer: Self-pay

## 2022-05-22 NOTE — Telephone Encounter (Signed)
Referring MD/PCP: Dr Pleas Koch  Procedure: Tcs  Reason/Indication:  Screening, fam hx of colon cancer  Has patient had this procedure before?  Yes   If so, when, by whom and where? 2013    Is there a family history of colon cancer?  Yes   Who?  What age when diagnosed?  mother  Is patient diabetic? If yes, Type 1 or Type 2   no       Does patient have prosthetic heart valve or mechanical valve?  no  Do you have a pacemaker/defibrillator?  No   Has patient ever had endocarditis/atrial fibrillation? No   Does patient use oxygen? No   Has patient had joint replacement within last 12 months?  no  Is patient constipated or do they take laxatives? No    Does patient have a history of alcohol/drug use?  No   Have you had a stroke/heart attack last 6 mths? No   Do you take medicine for weight loss?  No   For male patients,: have you had a hysterectomy n/a                      are you post menopausal n/a                      do you still have your menstrual cycle n/a  Is patient on blood thinner such as Coumadin, Plavix and/or Aspirin? yes  Medications: zonegran 100 mg 4 xs a day, oxcarbazepine 300 mg 2 daily, synthroid 50 mcg daily, finasteride 5 mg daily, alfuzosin, hcl ER 10 mg daily, asa 81 mg daily, MVI Mens 50 + daily, Metronidrzole Gel 1% prn   Allergies: Sulfur Drugs  Medication Adjustment per Dr Jenetta Downer none   Procedure date & time: 07/11/22 at 7:30

## 2022-05-29 ENCOUNTER — Telehealth (INDEPENDENT_AMBULATORY_CARE_PROVIDER_SITE_OTHER): Payer: Self-pay | Admitting: Gastroenterology

## 2022-05-29 NOTE — Telephone Encounter (Signed)
Spoke with pt. He is not going to be in town on 12/15 for pre-op . Asking if pre-op can be done 12/18 instead. Message sent to carolyn to check

## 2022-05-29 NOTE — Telephone Encounter (Signed)
Called pt and made aware of new pre-op appt

## 2022-05-29 NOTE — Telephone Encounter (Signed)
Patient left voice mail message stating he needs to reschedule his colonoscopy - please advise ph# [phone]

## 2022-05-30 DIAGNOSIS — E559 Vitamin D deficiency, unspecified: Secondary | ICD-10-CM | POA: Diagnosis not present

## 2022-05-30 DIAGNOSIS — R946 Abnormal results of thyroid function studies: Secondary | ICD-10-CM | POA: Diagnosis not present

## 2022-05-30 DIAGNOSIS — R319 Hematuria, unspecified: Secondary | ICD-10-CM | POA: Diagnosis not present

## 2022-05-30 DIAGNOSIS — R739 Hyperglycemia, unspecified: Secondary | ICD-10-CM | POA: Diagnosis not present

## 2022-05-30 DIAGNOSIS — E039 Hypothyroidism, unspecified: Secondary | ICD-10-CM | POA: Diagnosis not present

## 2022-06-07 DIAGNOSIS — R030 Elevated blood-pressure reading, without diagnosis of hypertension: Secondary | ICD-10-CM | POA: Diagnosis not present

## 2022-06-07 DIAGNOSIS — E039 Hypothyroidism, unspecified: Secondary | ICD-10-CM | POA: Diagnosis not present

## 2022-06-07 DIAGNOSIS — Z6827 Body mass index (BMI) 27.0-27.9, adult: Secondary | ICD-10-CM | POA: Diagnosis not present

## 2022-06-07 DIAGNOSIS — M1712 Unilateral primary osteoarthritis, left knee: Secondary | ICD-10-CM | POA: Diagnosis not present

## 2022-06-07 DIAGNOSIS — G40309 Generalized idiopathic epilepsy and epileptic syndromes, not intractable, without status epilepticus: Secondary | ICD-10-CM | POA: Diagnosis not present

## 2022-06-26 MED ORDER — PEG 3350-KCL-NA BICARB-NACL 420 G PO SOLR: 4000.0000 mL | Freq: Once | ORAL | 0 refills | Status: AC

## 2022-06-26 NOTE — Telephone Encounter (Signed)
Patient asking for prep for procedure to be sent to [place]. He would like to pick up before going out of town this Friday if possible.   [phone]

## 2022-06-26 NOTE — Telephone Encounter (Signed)
I have sent in rx for pt to [facility]

## 2022-07-07 ENCOUNTER — Other Ambulatory Visit (HOSPITAL_COMMUNITY): Payer: Medicare Other

## 2022-07-10 ENCOUNTER — Encounter (HOSPITAL_COMMUNITY)
Admission: RE | Admit: 2022-07-10 | Discharge: 2022-07-10 | Disposition: A | Discharge status: Home / Self Care | Payer: Medicare Other | Source: Ambulatory Visit | Attending: Gastroenterology | Admitting: Gastroenterology

## 2022-07-10 ENCOUNTER — Encounter (HOSPITAL_COMMUNITY): Payer: Self-pay

## 2022-07-10 HISTORY — DX: Unspecified osteoarthritis, unspecified site: M19.90

## 2022-07-10 HISTORY — DX: Personal history of urinary calculi: Z87.442

## 2022-07-11 ENCOUNTER — Ambulatory Visit (HOSPITAL_BASED_OUTPATIENT_CLINIC_OR_DEPARTMENT_OTHER): Payer: Medicare Other | Admitting: Anesthesiology

## 2022-07-11 ENCOUNTER — Ambulatory Visit (HOSPITAL_COMMUNITY): Payer: Medicare Other | Admitting: Anesthesiology

## 2022-07-11 ENCOUNTER — Ambulatory Visit (HOSPITAL_COMMUNITY)
Admission: RE | Admit: 2022-07-11 | Discharge: 2022-07-11 | Disposition: A | Discharge status: Home / Self Care | Payer: Medicare Other | Attending: Gastroenterology | Admitting: Gastroenterology

## 2022-07-11 ENCOUNTER — Other Ambulatory Visit: Payer: Self-pay

## 2022-07-11 ENCOUNTER — Encounter (HOSPITAL_COMMUNITY): Payer: Self-pay | Admitting: Gastroenterology

## 2022-07-11 ENCOUNTER — Encounter (HOSPITAL_COMMUNITY)
Admission: RE | Disposition: A | Discharge status: Home / Self Care | Payer: Self-pay | Source: Home / Self Care | Attending: Gastroenterology

## 2022-07-11 DIAGNOSIS — K648 Other hemorrhoids: Secondary | ICD-10-CM | POA: Diagnosis not present

## 2022-07-11 DIAGNOSIS — K649 Unspecified hemorrhoids: Secondary | ICD-10-CM | POA: Diagnosis not present

## 2022-07-11 DIAGNOSIS — R569 Unspecified convulsions: Secondary | ICD-10-CM | POA: Insufficient documentation

## 2022-07-11 DIAGNOSIS — Z86010 Personal history of colonic polyps: Secondary | ICD-10-CM | POA: Diagnosis not present

## 2022-07-11 DIAGNOSIS — Z800 Family history of malignant neoplasm of digestive organs: Secondary | ICD-10-CM | POA: Insufficient documentation

## 2022-07-11 DIAGNOSIS — Z1211 Encounter for screening for malignant neoplasm of colon: Secondary | ICD-10-CM

## 2022-07-11 DIAGNOSIS — K5730 Diverticulosis of large intestine without perforation or abscess without bleeding: Secondary | ICD-10-CM | POA: Insufficient documentation

## 2022-07-11 DIAGNOSIS — D122 Benign neoplasm of ascending colon: Secondary | ICD-10-CM | POA: Insufficient documentation

## 2022-07-11 DIAGNOSIS — E039 Hypothyroidism, unspecified: Secondary | ICD-10-CM | POA: Insufficient documentation

## 2022-07-11 DIAGNOSIS — K635 Polyp of colon: Secondary | ICD-10-CM

## 2022-07-11 DIAGNOSIS — Z87891 Personal history of nicotine dependence: Secondary | ICD-10-CM | POA: Insufficient documentation

## 2022-07-11 HISTORY — PX: POLYPECTOMY: SHX149

## 2022-07-11 HISTORY — PX: COLONOSCOPY WITH PROPOFOL: SHX5780

## 2022-07-11 LAB — HM COLONOSCOPY

## 2022-07-11 SURGERY — COLONOSCOPY WITH PROPOFOL
Anesthesia: General

## 2022-07-11 MED ORDER — PROPOFOL 500 MG/50ML IV EMUL
INTRAVENOUS | Status: DC | PRN
  Administered 2022-07-11: 150 ug/kg/min via INTRAVENOUS

## 2022-07-11 MED ORDER — STERILE WATER FOR IRRIGATION IR SOLN
Status: DC | PRN
  Administered 2022-07-11: 60 mL

## 2022-07-11 MED ORDER — LIDOCAINE HCL (CARDIAC) PF 100 MG/5ML IV SOSY
PREFILLED_SYRINGE | INTRAVENOUS | Status: DC | PRN
  Administered 2022-07-11: 50 mg via INTRAVENOUS

## 2022-07-11 MED ORDER — PROPOFOL 10 MG/ML IV BOLUS
INTRAVENOUS | Status: DC | PRN
  Administered 2022-07-11: 50 mg via INTRAVENOUS

## 2022-07-11 MED ORDER — LACTATED RINGERS IV SOLN: INTRAVENOUS | Status: DC

## 2022-07-11 NOTE — H&P (Signed)
[name] is an 81 y.o. male.   Chief Complaint: history of colon polyps HPI: 81 y/o F with PMH seizures, arthritis, coming for history of colon polyps.  Last colonoscopy in 2013, had 3 tubular adenomas.  The patient denies having any complaints such as melena, hematochezia, abdominal pain or distention, change in her bowel movement consistency or frequency, no changes in weight recently.  Mother was diagnosed with colon cancer in her 26s.   Past Medical History:  Diagnosis Date   Arthritis    History of colon polyps    removed with colonoscopy   History of kidney stones    Kidney stone    in past-has one at present, not bothersome   Seizures ([place])    none in 10 yrs- controlled with meds(Dr. Ned Clines [phone])    Past Surgical History:  Procedure Laterality Date   CHOLECYSTECTOMY  07/25/2012   Procedure: LAPAROSCOPIC CHOLECYSTECTOMY WITH INTRAOPERATIVE CHOLANGIOGRAM;  Surgeon: Ralene Ok, MD;  Location: [facility];  Service: General;;   COLONOSCOPY  11/15/2011   Procedure: COLONOSCOPY;  Surgeon: Rogene Houston, MD;  [facility];  Service: Endoscopy;  Laterality: N/A;  730   ELBOW SURGERY     chipped bone right elbow   TONSILLECTOMY     VASECTOMY      Family History  Family history unknown: Yes   Social History:  reports that he quit smoking about 40 years ago. His smoking use included cigarettes. He smoked an average of .5 packs per day. He has never used smokeless tobacco. He reports current alcohol use of about 1.0 standard drink of alcohol per week. He reports that he does not use drugs.  Allergies:  Allergies  Allergen Reactions   Sulfa Antibiotics Hives and Rash    Medications Prior to Admission  Medication Sig Dispense Refill   alfuzosin (UROXATRAL) 10 MG 24 hr tablet Take 1 tablet (10 mg total) by mouth daily with breakfast. 90 tablet 3   aspirin EC 81 MG tablet Take 81 mg by mouth every evening. Swallow whole.     finasteride (PROSCAR) 5 MG  tablet Take 1 tablet (5 mg total) by mouth daily. 90 tablet 3   Multiple Vitamin (MULTIVITAMIN WITH MINERALS) TABS tablet Take 1 tablet by mouth daily.     Oxcarbazepine (TRILEPTAL) 300 MG tablet Take 300 mg by mouth 2 (two) times daily.     sodium chloride (OCEAN) 0.65 % nasal spray Place 1 spray into the nose daily as needed for congestion.     SYNTHROID 50 MCG tablet Take 50 mcg by mouth daily.     zonisamide (ZONEGRAN) 100 MG capsule Take 400 mg by mouth in the morning.     metroNIDAZOLE (METROGEL) 1 % gel Apply 1 application topically daily as needed (Rosacea). Apply to face      No results found for this or any previous visit (from the past 48 hour(s)). No results found.  Review of Systems  All other systems reviewed and are negative.   Blood pressure (!) 181/89, pulse 70, temperature 97.6 F (36.4 C), temperature source Oral, resp. rate 17, height '5\' 7"'$  (1.702 m), weight 75.8 kg, SpO2 100 %. Physical Exam  GENERAL: The patient is AO x3, in no acute distress. HEENT: Head is normocephalic and atraumatic. EOMI are intact. Mouth is well hydrated and without lesions. NECK: Supple. No masses LUNGS: Clear to auscultation. No presence of rhonchi/wheezing/rales. Adequate chest expansion HEART: RRR, normal s1 and s2. ABDOMEN: Soft, nontender, no  guarding, no peritoneal signs, and nondistended. BS +. No masses. EXTREMITIES: Without any cyanosis, clubbing, rash, lesions or edema. NEUROLOGIC: AOx3, no focal motor deficit. SKIN: no jaundice, no rashes  Assessment/Plan 81 y/o F with PMH seizures, arthritis, coming for history of colon polyps.  We will proceed with colonoscopy.  Harvel Quale, MD 07/11/2022, 7:37 AM

## 2022-07-11 NOTE — Op Note (Signed)
[facility] Patient Name: [name] Procedure Date: 07/11/2022 7:18 AM MRN: [id number] Date of Birth: 05/24/1941 Attending MD: Maylon Peppers , , [phone] CSN: [id number] Age: 81 Admit Type: Outpatient Procedure:                Colonoscopy Indications:              Surveillance: Personal history of adenomatous                            polyps on last colonoscopy > 5 years ago, Family                            history of colon cancer in a first-degree relative                            before age 45 years Providers:                Maylon Peppers, Desloge Page, [place] Risa Grill, Technician Referring MD:              Medicines:                Monitored Anesthesia Care Complications:            No immediate complications. Estimated Blood Loss:     Estimated blood loss: none. Procedure:                Pre-Anesthesia Assessment:                           - Prior to the procedure, a History and Physical                            was performed, and patient medications, allergies                            and sensitivities were reviewed. The patient's                            tolerance of previous anesthesia was reviewed.                           - The risks and benefits of the procedure and the                            sedation options and risks were discussed with the                            patient. All questions were answered and informed                            consent was obtained.                           - ASA Grade Assessment: II -  A patient with mild                            systemic disease.                           After obtaining informed consent, the colonoscope                            was passed under direct vision. Throughout the                            procedure, the patient's blood pressure, pulse, and                            oxygen saturations were monitored continuously. The                             [id number]-HQ190L ([id number]) scope was introduced through                            the anus and advanced to the the cecum, identified                            by appendiceal orifice and ileocecal valve. The                            colonoscopy was performed without difficulty. The                            patient tolerated the procedure well. The quality                            of the bowel preparation was good. Scope In: 7:45:27 AM Scope Out: 8:10:00 AM Scope Withdrawal Time: 0 hours 15 minutes 11 seconds  Total Procedure Duration: 0 hours 24 minutes 33 seconds  Findings:      The perianal and digital rectal examinations were normal.      A 2 mm polyp was found in the ascending colon. The polyp was sessile.       The polyp was removed with a cold snare. Resection and retrieval were       complete.      Multiple medium-mouthed and small-mouthed diverticula were found in the       sigmoid colon and descending colon.      Non-bleeding internal hemorrhoids were found during retroflexion. The       hemorrhoids were small. Impression:               - One 2 mm polyp in the ascending colon, removed                            with a cold snare. Resected and retrieved.                           - Diverticulosis in the sigmoid colon and in the  descending colon.                           - Non-bleeding internal hemorrhoids. Moderate Sedation:      Per Anesthesia Care Recommendation:           - Discharge patient to home (ambulatory).                           - Resume previous diet.                           - Await pathology results.                           - Repeat colonoscopy in 5 years for surveillance. Procedure Code(s):        --- Professional ---                           [phone], Colonoscopy, flexible; with removal of                            tumor(s), polyp(s), or other lesion(s) by snare                            technique Diagnosis Code(s):         --- Professional ---                           Z86.010, Personal history of colonic polyps                           D12.2, Benign neoplasm of ascending colon                           K64.8, Other hemorrhoids                           Z80.0, Family history of malignant neoplasm of                            digestive organs                           K57.30, Diverticulosis of large intestine without                            perforation or abscess without bleeding CPT copyright 2022 American Medical Association. All rights reserved. The codes documented in this report are preliminary and upon coder review may  be revised to meet current compliance requirements. Maylon Peppers, MD Maylon Peppers,  07/11/2022 8:15:02 AM This report has been signed electronically. Number of Addenda: 0

## 2022-07-11 NOTE — OR Nursing (Signed)
Physician arrived to [facility] at 0734, patient rolled to procedure room at [phone]

## 2022-07-11 NOTE — Discharge Instructions (Signed)
You are being discharged to home.  Resume your previous diet.  We are waiting for your pathology results.  Your physician has recommended a repeat colonoscopy in five years for surveillance.  

## 2022-07-11 NOTE — Transfer of Care (Signed)
Immediate Anesthesia Transfer of Care Note  Patient: [name]  Procedure(s) Performed: COLONOSCOPY WITH PROPOFOL POLYPECTOMY INTESTINAL  Patient Location: PACU  Anesthesia Type:General  Level of Consciousness: awake, alert , oriented, and patient cooperative  Airway & Oxygen Therapy: Patient Spontanous Breathing  Post-op Assessment: Report given to RN, Post -op Vital signs reviewed and stable, and Patient moving all extremities X 4  Post vital signs: Reviewed and stable  Last Vitals:  Vitals Value Taken Time  BP    Temp    Pulse    Resp    SpO2      Last Pain:  Vitals:   07/11/22 0742  TempSrc:   PainSc: 0-No pain      Patients Stated Pain Goal: 8 (41/28/20 8138)  Complications: No notable events documented.

## 2022-07-11 NOTE — Anesthesia Preprocedure Evaluation (Signed)
Anesthesia Evaluation  Patient identified by MRN, date of birth, ID band Patient awake    Reviewed: Allergy & Precautions, H&P , NPO status , Patient's Chart, lab work & pertinent test results, reviewed documented beta blocker date and time   Airway Mallampati: II  TM Distance: >3 FB Neck ROM: full    Dental no notable dental hx.    Pulmonary neg pulmonary ROS, former smoker   Pulmonary exam normal breath sounds clear to auscultation       Cardiovascular Exercise Tolerance: Good negative cardio ROS  Rhythm:regular Rate:Normal     Neuro/Psych Seizures -, Well Controlled,   Neuromuscular disease  negative psych ROS   GI/Hepatic negative GI ROS, Neg liver ROS,,,  Endo/Other  Hypothyroidism    Renal/GU negative Renal ROS  negative genitourinary   Musculoskeletal   Abdominal   Peds  Hematology negative hematology ROS (+)   Anesthesia Other Findings   Reproductive/Obstetrics negative OB ROS                             Anesthesia Physical Anesthesia Plan  ASA: 2  Anesthesia Plan: General   Post-op Pain Management:    Induction:   PONV Risk Score and Plan: Propofol infusion  Airway Management Planned:   Additional Equipment:   Intra-op Plan:   Post-operative Plan:   Informed Consent: I have reviewed the patients History and Physical, chart, labs and discussed the procedure including the risks, benefits and alternatives for the proposed anesthesia with the patient or authorized representative who has indicated his/her understanding and acceptance.     Dental Advisory Given  Plan Discussed with: CRNA  Anesthesia Plan Comments:        Anesthesia Quick Evaluation

## 2022-07-12 ENCOUNTER — Encounter (INDEPENDENT_AMBULATORY_CARE_PROVIDER_SITE_OTHER): Payer: Self-pay | Admitting: *Deleted

## 2022-07-12 LAB — SURGICAL PATHOLOGY

## 2022-07-13 NOTE — Anesthesia Postprocedure Evaluation (Signed)
Anesthesia Post Note  Patient: [name]  Procedure(s) Performed: COLONOSCOPY WITH PROPOFOL POLYPECTOMY INTESTINAL  Patient location during evaluation: Phase II Anesthesia Type: General Level of consciousness: awake Pain management: pain level controlled Vital Signs Assessment: post-procedure vital signs reviewed and stable Respiratory status: spontaneous breathing and respiratory function stable Cardiovascular status: blood pressure returned to baseline and stable Postop Assessment: no headache and no apparent nausea or vomiting Anesthetic complications: no Comments: Late entry   No notable events documented.   Last Vitals:  Vitals:   07/11/22 0725 07/11/22 0816  BP: (!) 181/89 107/72  Pulse: 70 62  Resp: 17 15  Temp: 36.4 C 36.4 C  SpO2: 100% 97%    Last Pain:  Vitals:   07/11/22 0816  TempSrc: Oral  PainSc: 0-No pain                 Louann Sjogren

## 2022-07-19 ENCOUNTER — Encounter (HOSPITAL_COMMUNITY): Payer: Self-pay | Admitting: Gastroenterology

## 2022-08-07 ENCOUNTER — Other Ambulatory Visit: Payer: Self-pay | Admitting: Urology

## 2022-08-07 DIAGNOSIS — N138 Other obstructive and reflux uropathy: Secondary | ICD-10-CM

## 2022-08-15 DIAGNOSIS — L570 Actinic keratosis: Secondary | ICD-10-CM | POA: Diagnosis not present

## 2022-08-15 DIAGNOSIS — D485 Neoplasm of uncertain behavior of skin: Secondary | ICD-10-CM | POA: Diagnosis not present

## 2022-08-15 DIAGNOSIS — D0439 Carcinoma in situ of skin of other parts of face: Secondary | ICD-10-CM | POA: Diagnosis not present

## 2022-08-22 DIAGNOSIS — C44329 Squamous cell carcinoma of skin of other parts of face: Secondary | ICD-10-CM | POA: Diagnosis not present

## 2022-08-28 DIAGNOSIS — H40013 Open angle with borderline findings, low risk, bilateral: Secondary | ICD-10-CM | POA: Diagnosis not present

## 2022-09-05 DIAGNOSIS — Z20822 Contact with and (suspected) exposure to covid-19: Secondary | ICD-10-CM | POA: Diagnosis not present

## 2022-09-05 DIAGNOSIS — U071 COVID-19: Secondary | ICD-10-CM | POA: Diagnosis not present

## 2022-09-05 DIAGNOSIS — R519 Headache, unspecified: Secondary | ICD-10-CM | POA: Diagnosis not present

## 2022-09-14 DIAGNOSIS — R030 Elevated blood-pressure reading, without diagnosis of hypertension: Secondary | ICD-10-CM | POA: Diagnosis not present

## 2022-09-14 DIAGNOSIS — M1712 Unilateral primary osteoarthritis, left knee: Secondary | ICD-10-CM | POA: Diagnosis not present

## 2022-09-14 DIAGNOSIS — Z6827 Body mass index (BMI) 27.0-27.9, adult: Secondary | ICD-10-CM | POA: Diagnosis not present

## 2022-09-14 DIAGNOSIS — E039 Hypothyroidism, unspecified: Secondary | ICD-10-CM | POA: Diagnosis not present

## 2022-09-14 DIAGNOSIS — G40309 Generalized idiopathic epilepsy and epileptic syndromes, not intractable, without status epilepticus: Secondary | ICD-10-CM | POA: Diagnosis not present

## 2022-09-18 DIAGNOSIS — R739 Hyperglycemia, unspecified: Secondary | ICD-10-CM | POA: Diagnosis not present

## 2022-09-18 DIAGNOSIS — R946 Abnormal results of thyroid function studies: Secondary | ICD-10-CM | POA: Diagnosis not present

## 2022-09-18 DIAGNOSIS — G40309 Generalized idiopathic epilepsy and epileptic syndromes, not intractable, without status epilepticus: Secondary | ICD-10-CM | POA: Diagnosis not present

## 2022-09-18 DIAGNOSIS — Z13220 Encounter for screening for lipoid disorders: Secondary | ICD-10-CM | POA: Diagnosis not present

## 2022-10-02 DIAGNOSIS — E039 Hypothyroidism, unspecified: Secondary | ICD-10-CM | POA: Diagnosis not present

## 2022-10-02 DIAGNOSIS — M1712 Unilateral primary osteoarthritis, left knee: Secondary | ICD-10-CM | POA: Diagnosis not present

## 2022-10-02 DIAGNOSIS — N200 Calculus of kidney: Secondary | ICD-10-CM | POA: Diagnosis not present

## 2022-10-02 DIAGNOSIS — R030 Elevated blood-pressure reading, without diagnosis of hypertension: Secondary | ICD-10-CM | POA: Diagnosis not present

## 2022-10-02 DIAGNOSIS — G40309 Generalized idiopathic epilepsy and epileptic syndromes, not intractable, without status epilepticus: Secondary | ICD-10-CM | POA: Diagnosis not present

## 2022-10-02 DIAGNOSIS — Z6827 Body mass index (BMI) 27.0-27.9, adult: Secondary | ICD-10-CM | POA: Diagnosis not present

## 2022-10-09 ENCOUNTER — Telehealth: Payer: Self-pay

## 2022-10-09 ENCOUNTER — Ambulatory Visit (INDEPENDENT_AMBULATORY_CARE_PROVIDER_SITE_OTHER): Payer: Medicare Other | Admitting: Urology

## 2022-10-09 DIAGNOSIS — R310 Gross hematuria: Secondary | ICD-10-CM

## 2022-10-09 MED ORDER — NITROFURANTOIN MONOHYD MACRO 100 MG PO CAPS: 100.0000 mg | ORAL_CAPSULE | Freq: Two times a day (BID) | ORAL | 0 refills | Status: DC

## 2022-10-09 NOTE — Progress Notes (Signed)
Patient dropped off urine due to gross hematuria since last week. No pain noted.  post void residual=65 Unable to do micro per lab. Urine sent for culture.    Symptoms reviewed with Dr. Alyson Ingles. Macrobid po bid x 7 days sent to pharmacy per Dr. Noland Fordyce request. Patient called and made aware.

## 2022-10-09 NOTE — Telephone Encounter (Signed)
Pt added to [place] schedule for ua/uc

## 2022-10-09 NOTE — Telephone Encounter (Signed)
Patient called advising he has noticed blood in his urine since 3/11. Patient requested to drop off a urine sample.

## 2022-10-11 LAB — URINE CULTURE

## 2022-10-17 NOTE — Progress Notes (Signed)
Letter sent.

## 2022-12-15 ENCOUNTER — Encounter: Payer: Self-pay | Admitting: Urology

## 2022-12-15 ENCOUNTER — Ambulatory Visit (INDEPENDENT_AMBULATORY_CARE_PROVIDER_SITE_OTHER): Payer: Medicare Other | Admitting: Urology

## 2022-12-15 ENCOUNTER — Ambulatory Visit: Payer: Medicare Other | Admitting: Urology

## 2022-12-15 VITALS — BP 154/86 | HR 63

## 2022-12-15 DIAGNOSIS — R310 Gross hematuria: Secondary | ICD-10-CM | POA: Diagnosis not present

## 2022-12-15 DIAGNOSIS — N138 Other obstructive and reflux uropathy: Secondary | ICD-10-CM

## 2022-12-15 DIAGNOSIS — N401 Enlarged prostate with lower urinary tract symptoms: Secondary | ICD-10-CM | POA: Diagnosis not present

## 2022-12-15 DIAGNOSIS — R339 Retention of urine, unspecified: Secondary | ICD-10-CM

## 2022-12-15 LAB — URINALYSIS, ROUTINE W REFLEX MICROSCOPIC
Bilirubin, UA: NEGATIVE
Glucose, UA: NEGATIVE
Ketones, UA: NEGATIVE
Nitrite, UA: NEGATIVE
Protein,UA: NEGATIVE
Specific Gravity, UA: 1.015 (ref 1.005–1.030)
Urobilinogen, Ur: 0.2 mg/dL (ref 0.2–1.0)
pH, UA: 6.5 (ref 5.0–7.5)

## 2022-12-15 LAB — MICROSCOPIC EXAMINATION: Bacteria, UA: NONE SEEN

## 2022-12-15 MED ORDER — FINASTERIDE 5 MG PO TABS: 5.0000 mg | ORAL_TABLET | Freq: Every day | ORAL | 3 refills | Status: DC

## 2022-12-15 MED ORDER — ALFUZOSIN HCL ER 10 MG PO TB24: ORAL_TABLET | ORAL | 3 refills | Status: DC

## 2022-12-15 NOTE — Progress Notes (Signed)
12/15/2022 9:47 AM   [name] May 24, 1941 [id number]  Referring provider: Practice, [facility] [address] [place],  [place] [address]  Chief Complaint  Patient presents with   Hematuria    HPI: [name] is a 82yo here for followup for BPH and gross hematuria. He had been doing well until 2-3 weeks ago when he developed gross painless hematuria. He is on finasteride 5mg  daily. The hematuria has since resolved. IPSS 14 QOL 2 on uroxatral 10mg  and finasteride 5mg  daily. NO straining to urinate. He has nocturia 2-3x depending on fluid consumption.    PMH: Past Medical History:  Diagnosis Date   Arthritis    History of colon polyps    removed with colonoscopy   History of kidney stones    Kidney stone    in past-has one at present, not bothersome   Seizures (HCC)    none in 10 yrs- controlled with meds(Dr. Nicholes Stairs [phone])    Surgical History: Past Surgical History:  Procedure Laterality Date   CHOLECYSTECTOMY  07/25/2012   Procedure: LAPAROSCOPIC CHOLECYSTECTOMY WITH INTRAOPERATIVE CHOLANGIOGRAM;  Surgeon: Axel Filler, MD;  Location: [facility];  Service: General;;   COLONOSCOPY  11/15/2011   Procedure: COLONOSCOPY;  Surgeon: Malissa Hippo, MD;  Location: [facility];  Service: Endoscopy;  Laterality: N/A;  730   COLONOSCOPY WITH PROPOFOL N/A 07/11/2022   Procedure: COLONOSCOPY WITH PROPOFOL;  Surgeon: Dolores Frame, MD;  Location: [facility];  Service: Gastroenterology;  Laterality: N/A;  730 ASA 3   ELBOW SURGERY     chipped bone right elbow   POLYPECTOMY  07/11/2022   Procedure: POLYPECTOMY INTESTINAL;  Surgeon: Dolores Frame, MD;  Location: [facility];  Service: Gastroenterology;;   TONSILLECTOMY     VASECTOMY      Home Medications:  Allergies as of 12/15/2022       Reactions   Sulfa Antibiotics Hives, Rash        Medication List        Accurate as of Dec 15, 2022  9:47 AM. If you have any questions, ask your  nurse or doctor.          STOP taking these medications    nitrofurantoin (macrocrystal-monohydrate) 100 MG capsule Commonly known as: MACROBID Stopped by: Wilkie Aye, MD       TAKE these medications    alfuzosin 10 MG 24 hr tablet Commonly known as: UROXATRAL TAKE 1 TABLET WITH BREAKFAST.   aspirin EC 81 MG tablet Take 81 mg by mouth every evening. Swallow whole.   finasteride 5 MG tablet Commonly known as: PROSCAR Take 5 mg by mouth daily.   metroNIDAZOLE 1 % gel Commonly known as: METROGEL Apply 1 application topically daily as needed (Rosacea). Apply to face   multivitamin with minerals Tabs tablet Take 1 tablet by mouth daily.   nystatin 100000 UNIT/ML suspension Commonly known as: MYCOSTATIN Take by mouth.   Oxcarbazepine 300 MG tablet Commonly known as: TRILEPTAL Take 300 mg by mouth 2 (two) times daily.   sodium chloride 0.65 % nasal spray Commonly known as: OCEAN Place 1 spray into the nose daily as needed for congestion.   Synthroid 50 MCG tablet Generic drug: levothyroxine Take 50 mcg by mouth daily.   zonisamide 100 MG capsule Commonly known as: ZONEGRAN Take 400 mg by mouth in the morning.        Allergies:  Allergies  Allergen Reactions   Sulfa Antibiotics Hives and Rash  Family History: Family History  Family history unknown: Yes    Social History:  reports that he quit smoking about 40 years ago. His smoking use included cigarettes. He smoked an average of .5 packs per day. He has never used smokeless tobacco. He reports current alcohol use of about 1.0 standard drink of alcohol per week. He reports that he does not use drugs.  ROS: All other review of systems were reviewed and are negative except what is noted above in HPI  Physical Exam: BP (!) 154/86   Pulse 63   Constitutional:  Alert and oriented, No acute distress. HEENT: [place] AT, moist mucus membranes.  Trachea midline, no masses. Cardiovascular: No clubbing,  cyanosis, or edema. Respiratory: Normal respiratory effort, no increased work of breathing. GI: Abdomen is soft, nontender, nondistended, no abdominal masses GU: No CVA tenderness.  Lymph: No cervical or inguinal lymphadenopathy. Skin: No rashes, bruises or suspicious lesions. Neurologic: Grossly intact, no focal deficits, moving all 4 extremities. Psychiatric: Normal mood and affect.  Laboratory Data: Lab Results  Component Value Date   WBC 5.9 04/16/2022   HGB 13.2 04/16/2022   HCT 39.9 04/16/2022   MCV 94.3 04/16/2022   PLT 207 04/16/2022    Lab Results  Component Value Date   CREATININE 1.07 04/16/2022    No results found for: "PSA"  No results found for: "TESTOSTERONE"  No results found for: "HGBA1C"  Urinalysis    Component Value Date/Time   COLORURINE RED (A) 04/16/2022 1126   APPEARANCEUR Cloudy (A) 04/17/2022 1410   LABSPEC  04/16/2022 1126    TEST NOT REPORTED DUE TO COLOR INTERFERENCE OF URINE PIGMENT   PHURINE  04/16/2022 1126    TEST NOT REPORTED DUE TO COLOR INTERFERENCE OF URINE PIGMENT   GLUCOSEU Negative 04/17/2022 1410   HGBUR (A) 04/16/2022 1126    TEST NOT REPORTED DUE TO COLOR INTERFERENCE OF URINE PIGMENT   BILIRUBINUR Negative 04/17/2022 1410   KETONESUR (A) 04/16/2022 1126    TEST NOT REPORTED DUE TO COLOR INTERFERENCE OF URINE PIGMENT   PROTEINUR 2+ (A) 04/17/2022 1410   PROTEINUR (A) 04/16/2022 1126    TEST NOT REPORTED DUE TO COLOR INTERFERENCE OF URINE PIGMENT   NITRITE Negative 04/17/2022 1410   NITRITE (A) 04/16/2022 1126    TEST NOT REPORTED DUE TO COLOR INTERFERENCE OF URINE PIGMENT   LEUKOCYTESUR 1+ (A) 04/17/2022 1410   LEUKOCYTESUR (A) 04/16/2022 1126    TEST NOT REPORTED DUE TO COLOR INTERFERENCE OF URINE PIGMENT    Lab Results  Component Value Date   LABMICR See below: 04/17/2022   WBCUA 6-10 (A) 04/17/2022   LABEPIT 0-10 04/17/2022   MUCUS Present 12/12/2021   BACTERIA None seen 04/17/2022    Pertinent  Imaging:  No results found for this or any previous visit.  No results found for this or any previous visit.  No results found for this or any previous visit.  No results found for this or any previous visit.  No results found for this or any previous visit.  No valid procedures specified. No results found for this or any previous visit.  Results for orders placed during the hospital encounter of 06/13/21  CT Renal Stone Study  Narrative CLINICAL DATA:  Gross hematuria.  EXAM: CT ABDOMEN AND PELVIS WITHOUT CONTRAST  TECHNIQUE: Multidetector CT imaging of the abdomen and pelvis was performed following the standard protocol without IV contrast.  COMPARISON:  May 11, 2020.  FINDINGS: Lower chest: No acute abnormality.  Hepatobiliary: No focal liver abnormality is seen. Status post cholecystectomy. No biliary dilatation.  Pancreas: Unremarkable. No pancreatic ductal dilatation or surrounding inflammatory changes.  Spleen: Normal in size without focal abnormality.  Adrenals/Urinary Tract: Adrenal glands appear normal. Nonobstructive left nephrolithiasis is noted. Minimal bilateral hydroureteronephrosis is noted without evidence of obstructing calculus. Multiple layering calculi are noted posteriorly in the urinary bladder. Foley catheter appears to be inflated within the penile portion of the urethra.  Stomach/Bowel: Stomach is within normal limits. Appendix appears normal. No evidence of bowel wall thickening, distention, or inflammatory changes.  Vascular/Lymphatic: Aortic atherosclerosis. No enlarged abdominal or pelvic lymph nodes.  Reproductive: Stable mild prostatic enlargement is noted. Prostatic calcifications are noted.  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 Stable small periumbilical hernia is noted which contains a loop of small bowel, but does not result in obstruction.  Musculoskeletal: No acute or significant  osseous findings.  IMPRESSION: Foley catheter appears to be inflated within the penile urethra; repositioning is recommended. Critical Value/emergent results were called by telephone at the time of interpretation on 06/13/2021 at 2:41 pm to provider [facility] , who verbally acknowledged these results.  Nonobstructive left nephrolithiasis.  Mild bilateral hydroureteronephrosis is noted without evidence of obstructing ureteral calculus.  Mild layering calculi are noted posteriorly in the dependent portion of the urinary bladder.  Stable mild prostatic enlargement.  Aortic Atherosclerosis (ICD10-I70.0).   Electronically Signed By: Lupita Raider M.D. On: 06/13/2021 14:42   Assessment & Plan:    1. Benign prostatic hyperplasia with urinary obstruction We discussed the management of his BPH including continued medical therapy, Rezum, Urolift, TURP and simple prostatectomy. After discussing the options the patient has elected to proceed with continue medical therapy. Risks/benefits/alternatives discussed.   2. Gross hematuria Finasteride 5mg  daily  3. Incomplete emptying of bladder -continue uroxatral 10mg  qhs    No follow-ups on file.  Wilkie Aye, MD  [facility] [place]

## 2022-12-15 NOTE — Patient Instructions (Signed)

## 2022-12-20 LAB — URINE CULTURE

## 2022-12-21 ENCOUNTER — Telehealth: Payer: Self-pay

## 2022-12-21 MED ORDER — NITROFURANTOIN MONOHYD MACRO 100 MG PO CAPS: 100.0000 mg | ORAL_CAPSULE | Freq: Two times a day (BID) | ORAL | 0 refills | Status: DC

## 2022-12-21 NOTE — Telephone Encounter (Signed)
Patient called and made aware of positive urine culture and Macrobid sent to pharmacy per Dr. Ronne Binning. Patient voiced understanding.

## 2023-01-31 ENCOUNTER — Telehealth: Payer: Self-pay

## 2023-01-31 DIAGNOSIS — M25462 Effusion, left knee: Secondary | ICD-10-CM | POA: Diagnosis not present

## 2023-01-31 DIAGNOSIS — R030 Elevated blood-pressure reading, without diagnosis of hypertension: Secondary | ICD-10-CM | POA: Diagnosis not present

## 2023-01-31 DIAGNOSIS — M1712 Unilateral primary osteoarthritis, left knee: Secondary | ICD-10-CM | POA: Diagnosis not present

## 2023-01-31 NOTE — Telephone Encounter (Signed)
Patient called and made aware that a years rx was sent to [facility] on 5/24. [name] called and confirmed that the rx was active and on file. Patient made aware to just call pharmacy for refill.

## 2023-01-31 NOTE — Telephone Encounter (Signed)
Patient called and advised they needed a refill on medication below.   Medication: finasteride (PROSCAR) 5 MG tablet    Pharmacy: [facility]  Thank you

## 2023-02-05 DIAGNOSIS — J9801 Acute bronchospasm: Secondary | ICD-10-CM | POA: Diagnosis not present

## 2023-02-05 DIAGNOSIS — Z6826 Body mass index (BMI) 26.0-26.9, adult: Secondary | ICD-10-CM | POA: Diagnosis not present

## 2023-02-05 DIAGNOSIS — R030 Elevated blood-pressure reading, without diagnosis of hypertension: Secondary | ICD-10-CM | POA: Diagnosis not present

## 2023-02-05 DIAGNOSIS — Z20828 Contact with and (suspected) exposure to other viral communicable diseases: Secondary | ICD-10-CM | POA: Diagnosis not present

## 2023-02-05 DIAGNOSIS — J0100 Acute maxillary sinusitis, unspecified: Secondary | ICD-10-CM | POA: Diagnosis not present

## 2023-02-07 DIAGNOSIS — M1712 Unilateral primary osteoarthritis, left knee: Secondary | ICD-10-CM | POA: Diagnosis not present

## 2023-02-07 DIAGNOSIS — R030 Elevated blood-pressure reading, without diagnosis of hypertension: Secondary | ICD-10-CM | POA: Diagnosis not present

## 2023-02-07 DIAGNOSIS — Z6826 Body mass index (BMI) 26.0-26.9, adult: Secondary | ICD-10-CM | POA: Diagnosis not present

## 2023-02-07 DIAGNOSIS — M25462 Effusion, left knee: Secondary | ICD-10-CM | POA: Diagnosis not present

## 2023-02-14 DIAGNOSIS — Z6826 Body mass index (BMI) 26.0-26.9, adult: Secondary | ICD-10-CM | POA: Diagnosis not present

## 2023-02-14 DIAGNOSIS — R030 Elevated blood-pressure reading, without diagnosis of hypertension: Secondary | ICD-10-CM | POA: Diagnosis not present

## 2023-02-14 DIAGNOSIS — L820 Inflamed seborrheic keratosis: Secondary | ICD-10-CM | POA: Diagnosis not present

## 2023-02-14 DIAGNOSIS — Z85828 Personal history of other malignant neoplasm of skin: Secondary | ICD-10-CM | POA: Diagnosis not present

## 2023-02-14 DIAGNOSIS — M25462 Effusion, left knee: Secondary | ICD-10-CM | POA: Diagnosis not present

## 2023-02-14 DIAGNOSIS — D485 Neoplasm of uncertain behavior of skin: Secondary | ICD-10-CM | POA: Diagnosis not present

## 2023-02-14 DIAGNOSIS — M1712 Unilateral primary osteoarthritis, left knee: Secondary | ICD-10-CM | POA: Diagnosis not present

## 2023-02-14 DIAGNOSIS — L570 Actinic keratosis: Secondary | ICD-10-CM | POA: Diagnosis not present

## 2023-02-26 DIAGNOSIS — H35033 Hypertensive retinopathy, bilateral: Secondary | ICD-10-CM | POA: Diagnosis not present

## 2023-02-26 DIAGNOSIS — H40013 Open angle with borderline findings, low risk, bilateral: Secondary | ICD-10-CM | POA: Diagnosis not present

## 2023-02-26 DIAGNOSIS — H3561 Retinal hemorrhage, right eye: Secondary | ICD-10-CM | POA: Diagnosis not present

## 2023-02-26 DIAGNOSIS — H04123 Dry eye syndrome of bilateral lacrimal glands: Secondary | ICD-10-CM | POA: Diagnosis not present

## 2023-03-26 ENCOUNTER — Emergency Department (HOSPITAL_COMMUNITY)
Admission: EM | Admit: 2023-03-26 | Discharge: 2023-03-26 | Disposition: A | Discharge status: Home / Self Care | Payer: Medicare Other | Attending: Emergency Medicine | Admitting: Emergency Medicine

## 2023-03-26 ENCOUNTER — Other Ambulatory Visit: Payer: Self-pay

## 2023-03-26 DIAGNOSIS — Z7982 Long term (current) use of aspirin: Secondary | ICD-10-CM | POA: Insufficient documentation

## 2023-03-26 DIAGNOSIS — R310 Gross hematuria: Secondary | ICD-10-CM

## 2023-03-26 DIAGNOSIS — R319 Hematuria, unspecified: Secondary | ICD-10-CM | POA: Diagnosis present

## 2023-03-26 DIAGNOSIS — N3001 Acute cystitis with hematuria: Secondary | ICD-10-CM | POA: Diagnosis not present

## 2023-03-26 LAB — CBC WITH DIFFERENTIAL/PLATELET
Abs Immature Granulocytes: 0.03 10*3/uL (ref 0.00–0.07)
Basophils Absolute: 0 10*3/uL (ref 0.0–0.1)
Basophils Relative: 1 %
Eosinophils Absolute: 0.1 10*3/uL (ref 0.0–0.5)
Eosinophils Relative: 3 %
HCT: 37.6 % — ABNORMAL LOW (ref 39.0–52.0)
Hemoglobin: 12.6 g/dL — ABNORMAL LOW (ref 13.0–17.0)
Immature Granulocytes: 1 %
Lymphocytes Relative: 22 %
Lymphs Abs: 1.2 10*3/uL (ref 0.7–4.0)
MCH: 31 pg (ref 26.0–34.0)
MCHC: 33.5 g/dL (ref 30.0–36.0)
MCV: 92.6 fL (ref 80.0–100.0)
Monocytes Absolute: 0.6 10*3/uL (ref 0.1–1.0)
Monocytes Relative: 11 %
Neutro Abs: 3.4 10*3/uL (ref 1.7–7.7)
Neutrophils Relative %: 62 %
Platelets: 193 10*3/uL (ref 150–400)
RBC: 4.06 MIL/uL — ABNORMAL LOW (ref 4.22–5.81)
RDW: 12.8 % (ref 11.5–15.5)
WBC: 5.3 10*3/uL (ref 4.0–10.5)
nRBC: 0 % (ref 0.0–0.2)

## 2023-03-26 LAB — BASIC METABOLIC PANEL
Anion gap: 7 (ref 5–15)
BUN: 18 mg/dL (ref 8–23)
CO2: 24 mmol/L (ref 22–32)
Calcium: 9.6 mg/dL (ref 8.9–10.3)
Chloride: 104 mmol/L (ref 98–111)
Creatinine, Ser: 1 mg/dL (ref 0.61–1.24)
GFR, Estimated: >60 mL/min (ref >60)
Glucose, Bld: 95 mg/dL (ref 70–99)
Potassium: 4.1 mmol/L (ref 3.5–5.1)
Sodium: 135 mmol/L (ref 135–145)

## 2023-03-26 LAB — URINALYSIS, ROUTINE W REFLEX MICROSCOPIC

## 2023-03-26 LAB — URINALYSIS, MICROSCOPIC (REFLEX)
Bacteria, UA: NONE SEEN
RBC / HPF: >50 RBC/hpf (ref 0–5)
Squamous Epithelial / HPF: NONE SEEN /HPF (ref 0–5)
WBC, UA: NONE SEEN WBC/hpf (ref 0–5)

## 2023-03-26 MED ORDER — SODIUM CHLORIDE 0.9 % IV SOLN
2.0000 g | Freq: Once | INTRAVENOUS | Status: AC
  Administered 2023-03-26: 2 g via INTRAVENOUS
  Filled 2023-03-26: qty 20

## 2023-03-26 MED ORDER — CEPHALEXIN 500 MG PO CAPS: 500.0000 mg | ORAL_CAPSULE | Freq: Four times a day (QID) | ORAL | 0 refills | Status: DC

## 2023-03-26 NOTE — ED Notes (Signed)
Pt informed of need for urine specimen, given a urinal

## 2023-03-26 NOTE — ED Triage Notes (Signed)
Pt came in POV for c/o blood and blood clots in his urine since Fri night. No abd pain, no n/v or any other symptoms.

## 2023-03-26 NOTE — Discharge Instructions (Signed)
Follow-up with your urologist this week. let him know that you are in the emergency department

## 2023-03-27 LAB — URINE CULTURE: Culture: NO GROWTH

## 2023-03-27 NOTE — ED Provider Notes (Signed)
[place] EMERGENCY DEPARTMENT AT [facility] Provider Note   CSN: [id number] Arrival date & time: 03/26/23  0940     History  Chief Complaint  Patient presents with   Hematuria    [name] is a 82 y.o. male.  Patient complains of hematuria.  Patient states he had a urinary tract infection before when he had hematuria.  He has a history of enlarged prostate  The history is provided by the patient. No language interpreter was used.  Hematuria This is a new problem. The current episode started 2 days ago. The problem occurs constantly. The problem has not changed since onset.Pertinent negatives include no chest pain, no abdominal pain and no headaches. Nothing aggravates the symptoms. Nothing relieves the symptoms.       Home Medications Prior to Admission medications   Medication Sig Start Date End Date Taking? Authorizing Provider  cephALEXin (KEFLEX) 500 MG capsule Take 1 capsule (500 mg total) by mouth 4 (four) times daily. 03/26/23  Yes Bethann Berkshire, MD  alfuzosin (UROXATRAL) 10 MG 24 hr tablet TAKE 1 TABLET WITH BREAKFAST. 12/15/22   McKenzie, Mardene Celeste, MD  aspirin EC 81 MG tablet Take 81 mg by mouth every evening. Swallow whole.    [provider]  finasteride (PROSCAR) 5 MG tablet Take 1 tablet (5 mg total) by mouth daily. 12/15/22   McKenzie, Mardene Celeste, MD  metroNIDAZOLE (METROGEL) 1 % gel Apply 1 application topically daily as needed (Rosacea). Apply to face    [provider]  Multiple Vitamin (MULTIVITAMIN WITH MINERALS) TABS tablet Take 1 tablet by mouth daily.    [provider]  nitrofurantoin, macrocrystal-monohydrate, (MACROBID) 100 MG capsule Take 1 capsule (100 mg total) by mouth 2 (two) times daily. 12/21/22   McKenzie, Mardene Celeste, MD  nystatin (MYCOSTATIN) 100000 UNIT/ML suspension Take by mouth. 07/27/22   [provider]  Oxcarbazepine (TRILEPTAL) 300 MG tablet Take 300 mg by mouth 2 (two) times daily.     [provider]  sodium chloride (OCEAN) 0.65 % nasal spray Place 1 spray into the nose daily as needed for congestion.    [provider]  SYNTHROID 50 MCG tablet Take 50 mcg by mouth daily. 06/08/20   [provider]  zonisamide (ZONEGRAN) 100 MG capsule Take 400 mg by mouth in the morning.    [provider]      Allergies    Sulfa antibiotics    Review of Systems   Review of Systems  Constitutional:  Negative for appetite change and fatigue.  HENT:  Negative for congestion, ear discharge and sinus pressure.   Eyes:  Negative for discharge.  Respiratory:  Negative for cough.   Cardiovascular:  Negative for chest pain.  Gastrointestinal:  Negative for abdominal pain and diarrhea.  Genitourinary:  Positive for hematuria. Negative for frequency.  Musculoskeletal:  Negative for back pain.  Skin:  Negative for rash.  Neurological:  Negative for seizures and headaches.  Psychiatric/Behavioral:  Negative for hallucinations.     Physical Exam Updated Vital Signs BP (!) 162/94   Pulse (!) 55   Temp (!) 97.5 F (36.4 C) (Oral)   Resp 18   Ht 5' 7.5" (1.715 m)   Wt 73 kg   SpO2 98%   BMI 24.84 kg/m  Physical Exam Vitals and nursing note reviewed.  Constitutional:      Appearance: He is well-developed.  HENT:     Head: Normocephalic.     Nose:  Nose normal.  Eyes:     General: No scleral icterus.    Conjunctiva/sclera: Conjunctivae normal.  Neck:     Thyroid: No thyromegaly.  Cardiovascular:     Rate and Rhythm: Normal rate and regular rhythm.     Heart sounds: No murmur heard.    No friction rub. No gallop.  Pulmonary:     Breath sounds: No stridor. No wheezing or rales.  Chest:     Chest wall: No tenderness.  Abdominal:     General: There is no distension.     Tenderness: There is no abdominal tenderness. There is no rebound.  Genitourinary:    Comments: Hematuria Musculoskeletal:        General: Normal range of motion.      Cervical back: Neck supple.  Lymphadenopathy:     Cervical: No cervical adenopathy.  Skin:    Findings: No erythema or rash.  Neurological:     Mental Status: He is oriented to person, place, and time.     Motor: No abnormal muscle tone.     Coordination: Coordination normal.  Psychiatric:        Behavior: Behavior normal.     ED Results / Procedures / Treatments   Labs (all labs ordered are listed, but only abnormal results are displayed) Labs Reviewed  CBC WITH DIFFERENTIAL/PLATELET - Abnormal; Notable for the following components:      Result Value   RBC 4.06 (*)    Hemoglobin 12.6 (*)    HCT 37.6 (*)    All other components within normal limits  URINALYSIS, ROUTINE W REFLEX MICROSCOPIC - Abnormal; Notable for the following components:   Color, Urine RED (*)    APPearance TURBID (*)    Glucose, UA   (*)    Value: TEST NOT REPORTED DUE TO COLOR INTERFERENCE OF URINE PIGMENT   Hgb urine dipstick   (*)    Value: TEST NOT REPORTED DUE TO COLOR INTERFERENCE OF URINE PIGMENT   Bilirubin Urine   (*)    Value: TEST NOT REPORTED DUE TO COLOR INTERFERENCE OF URINE PIGMENT   Ketones, ur   (*)    Value: TEST NOT REPORTED DUE TO COLOR INTERFERENCE OF URINE PIGMENT   Protein, ur   (*)    Value: TEST NOT REPORTED DUE TO COLOR INTERFERENCE OF URINE PIGMENT   Nitrite   (*)    Value: TEST NOT REPORTED DUE TO COLOR INTERFERENCE OF URINE PIGMENT   Leukocytes,Ua   (*)    Value: TEST NOT REPORTED DUE TO COLOR INTERFERENCE OF URINE PIGMENT   All other components within normal limits  URINE CULTURE  BASIC METABOLIC PANEL  URINALYSIS, MICROSCOPIC (REFLEX)    EKG None  Radiology No results found.  Procedures Procedures    Medications Ordered in ED Medications  cefTRIAXone (ROCEPHIN) 2 g in sodium chloride 0.9 % 100 mL IVPB (0 g Intravenous Stopped 03/26/23 1320)    ED Course/ Medical Decision Making/ A&P                                 Medical Decision Making Amount and/or  Complexity of Data Reviewed Labs: ordered.  Risk Prescription drug management.   Patient with hematuria, possible urinary tract infection.  Patient placed on Keflex and will follow-up with urologist this week        Final Clinical Impression(s) / ED Diagnoses Final diagnoses:  Gross hematuria  Acute  cystitis with hematuria    Rx / DC Orders ED Discharge Orders          Ordered    cephALEXin (KEFLEX) 500 MG capsule  4 times daily        03/26/23 1350              Bethann Berkshire, MD 03/27/23 1614

## 2023-03-28 ENCOUNTER — Emergency Department (HOSPITAL_COMMUNITY)
Admission: EM | Admit: 2023-03-28 | Discharge: 2023-03-28 | Disposition: A | Discharge status: Home / Self Care | Payer: Medicare Other | Attending: Emergency Medicine | Admitting: Emergency Medicine

## 2023-03-28 ENCOUNTER — Encounter (HOSPITAL_COMMUNITY): Payer: Self-pay | Admitting: Emergency Medicine

## 2023-03-28 ENCOUNTER — Telehealth: Payer: Self-pay | Admitting: Urology

## 2023-03-28 ENCOUNTER — Other Ambulatory Visit: Payer: Self-pay

## 2023-03-28 DIAGNOSIS — Z7982 Long term (current) use of aspirin: Secondary | ICD-10-CM | POA: Diagnosis not present

## 2023-03-28 DIAGNOSIS — R319 Hematuria, unspecified: Secondary | ICD-10-CM | POA: Insufficient documentation

## 2023-03-28 DIAGNOSIS — R339 Retention of urine, unspecified: Secondary | ICD-10-CM | POA: Insufficient documentation

## 2023-03-28 DIAGNOSIS — R310 Gross hematuria: Secondary | ICD-10-CM | POA: Diagnosis not present

## 2023-03-28 LAB — URINALYSIS, ROUTINE W REFLEX MICROSCOPIC
Bilirubin Urine: NEGATIVE
Glucose, UA: NEGATIVE mg/dL
Ketones, ur: NEGATIVE mg/dL
Leukocytes,Ua: NEGATIVE
Nitrite: NEGATIVE
Protein, ur: 100 mg/dL — AB
RBC / HPF: >50 RBC/hpf (ref 0–5)
Specific Gravity, Urine: 1.006 (ref 1.005–1.030)
WBC, UA: >50 WBC/hpf (ref 0–5)
pH: 7 (ref 5.0–8.0)

## 2023-03-28 NOTE — ED Provider Notes (Signed)
[place] EMERGENCY DEPARTMENT AT [facility] Provider Note   CSN: [id number] Arrival date & time: 03/28/23  0055     History  Chief Complaint  Patient presents with   Urinary Retention    [name] is a 82 y.o. male.  Patient currently on Keflex due to presumed urinary tract infection in the setting hematuria presents to the emergency department complaining of urinary retention.  He states that his last successful urination attempt was at approximately 5 PM.  He complains of suprapubic pain due to inability to urinate.  He endorses history of enlarged prostate and previous hematuria from urinary tract infection.  He denies taking blood thinners.  Past medical history otherwise significant for urinary obstruction, nephrolithiasis, gross hematuria  HPI     Home Medications Prior to Admission medications   Medication Sig Start Date End Date Taking? Authorizing Provider  alfuzosin (UROXATRAL) 10 MG 24 hr tablet TAKE 1 TABLET WITH BREAKFAST. 12/15/22   McKenzie, Mardene Celeste, MD  aspirin EC 81 MG tablet Take 81 mg by mouth every evening. Swallow whole.    [provider]  cephALEXin (KEFLEX) 500 MG capsule Take 1 capsule (500 mg total) by mouth 4 (four) times daily. 03/26/23   Bethann Berkshire, MD  finasteride (PROSCAR) 5 MG tablet Take 1 tablet (5 mg total) by mouth daily. 12/15/22   McKenzie, Mardene Celeste, MD  metroNIDAZOLE (METROGEL) 1 % gel Apply 1 application topically daily as needed (Rosacea). Apply to face    [provider]  Multiple Vitamin (MULTIVITAMIN WITH MINERALS) TABS tablet Take 1 tablet by mouth daily.    [provider]  nitrofurantoin, macrocrystal-monohydrate, (MACROBID) 100 MG capsule Take 1 capsule (100 mg total) by mouth 2 (two) times daily. 12/21/22   McKenzie, Mardene Celeste, MD  nystatin (MYCOSTATIN) 100000 UNIT/ML suspension Take by mouth. 07/27/22   [provider]  Oxcarbazepine (TRILEPTAL) 300 MG tablet Take 300 mg by mouth 2  (two) times daily.    [provider]  sodium chloride (OCEAN) 0.65 % nasal spray Place 1 spray into the nose daily as needed for congestion.    [provider]  SYNTHROID 50 MCG tablet Take 50 mcg by mouth daily. 06/08/20   [provider]  zonisamide (ZONEGRAN) 100 MG capsule Take 400 mg by mouth in the morning.    [provider]      Allergies    Sulfa antibiotics    Review of Systems   Review of Systems  Physical Exam Updated Vital Signs BP (!) 176/99   Pulse 70   Temp 97.7 F (36.5 C) (Oral)   Resp 13   SpO2 98%  Physical Exam Vitals and nursing note reviewed.  HENT:     Head: Normocephalic and atraumatic.  Eyes:     Conjunctiva/sclera: Conjunctivae normal.  Cardiovascular:     Rate and Rhythm: Normal rate.  Pulmonary:     Effort: Pulmonary effort is normal. No respiratory distress.  Musculoskeletal:        General: No signs of injury.     Cervical back: Normal range of motion.  Skin:    General: Skin is dry.  Neurological:     Mental Status: He is alert.  Psychiatric:        Speech: Speech normal.        Behavior: Behavior normal.     ED Results / Procedures / Treatments   Labs (all labs ordered are listed, but only abnormal results are displayed)  Labs Reviewed  URINALYSIS, ROUTINE W REFLEX MICROSCOPIC - Abnormal; Notable for the following components:      Result Value   Color, Urine RED (*)    APPearance HAZY (*)    Hgb urine dipstick MODERATE (*)    Protein, ur 100 (*)    Bacteria, UA FEW (*)    All other components within normal limits    EKG None  Radiology No results found.  Procedures Procedures    Medications Ordered in ED Medications - No data to display  ED Course/ Medical Decision Making/ A&P                                 Medical Decision Making Amount and/or Complexity of Data Reviewed Labs: ordered.   Patient presents to the emergency department the chief complaint of urinary  retention.  Bladder scan was performed which showed over 200 mL of urine retained.  Foley catheter placed with immediate urine return.  Urine with gross hematuria.  Patient feeling much better at this time.   Urinalysis performed showing moderate hemoglobin, protein, few bacteria.  Plan to have patient follow-up with urology.  Will discharge home with Foley catheter in place.  Patient states he has had similar issues before.  Return precautions provided including inability to urinate, shortness of breath, chest pain, fatigue.  At this time do not feel that other labs are necessary.  Will discharge home with plans for outpatient urology follow-up.        Final Clinical Impression(s) / ED Diagnoses Final diagnoses:  Urinary retention  Hematuria, unspecified type    Rx / DC Orders ED Discharge Orders     None         Pamala Duffel 03/28/23 0235    Gilda Crease, MD 03/28/23 [phone]

## 2023-03-28 NOTE — ED Notes (Signed)
Pt educated on leg bag vs standard dependent drainage bag. Pt sent home with leg bag. DC papers reviewed. Wife educated on changing bags at home.

## 2023-03-28 NOTE — ED Triage Notes (Signed)
Patient coming to ED for evaluation of urinary retention. Reports he was seen at [facility] ED earlier and dx with UTI.  Has had hematuria.  States he was started on antibiotics for infection.  Tonight has not been able to urinate.  Hx of enlarged prostate

## 2023-03-28 NOTE — Discharge Instructions (Signed)
You were evaluated today for urinary retention.  Please continue to utilize the Foley catheter until following up with urology.  If you are unable to urinate even with the catheter in place, or develop other life-threatening symptoms, please return to the emergency department.

## 2023-03-28 NOTE — Telephone Encounter (Signed)
Patient was sent to [facility] and they had to place a foley cath because he could not urinate, he would like to know if he could come in and have the cath removed before his next appt ?

## 2023-03-29 NOTE — Telephone Encounter (Signed)
Patient is made aware of Sarah recommendation to wait unit provider visit. Patient states his catheter is leaking some around the head of the penis but the catheter is flowing freely. Patient is made aware that this normal but I will send his concern to Maralyn Sago on recommnedation. Patient voiced understanding

## 2023-04-02 DIAGNOSIS — Z6826 Body mass index (BMI) 26.0-26.9, adult: Secondary | ICD-10-CM | POA: Diagnosis not present

## 2023-04-02 DIAGNOSIS — N139 Obstructive and reflux uropathy, unspecified: Secondary | ICD-10-CM | POA: Diagnosis not present

## 2023-04-02 DIAGNOSIS — G40309 Generalized idiopathic epilepsy and epileptic syndromes, not intractable, without status epilepticus: Secondary | ICD-10-CM | POA: Diagnosis not present

## 2023-04-02 DIAGNOSIS — R946 Abnormal results of thyroid function studies: Secondary | ICD-10-CM | POA: Diagnosis not present

## 2023-04-02 DIAGNOSIS — R739 Hyperglycemia, unspecified: Secondary | ICD-10-CM | POA: Diagnosis not present

## 2023-04-02 DIAGNOSIS — Z0001 Encounter for general adult medical examination with abnormal findings: Secondary | ICD-10-CM | POA: Diagnosis not present

## 2023-04-02 DIAGNOSIS — M25462 Effusion, left knee: Secondary | ICD-10-CM | POA: Diagnosis not present

## 2023-04-02 DIAGNOSIS — M1712 Unilateral primary osteoarthritis, left knee: Secondary | ICD-10-CM | POA: Diagnosis not present

## 2023-04-02 DIAGNOSIS — Z978 Presence of other specified devices: Secondary | ICD-10-CM | POA: Diagnosis not present

## 2023-04-02 DIAGNOSIS — R030 Elevated blood-pressure reading, without diagnosis of hypertension: Secondary | ICD-10-CM | POA: Diagnosis not present

## 2023-04-02 DIAGNOSIS — E039 Hypothyroidism, unspecified: Secondary | ICD-10-CM | POA: Diagnosis not present

## 2023-04-02 DIAGNOSIS — Z13220 Encounter for screening for lipoid disorders: Secondary | ICD-10-CM | POA: Diagnosis not present

## 2023-04-03 NOTE — Progress Notes (Unsigned)
Name: [name] DOB: 1940/08/18 MRN: [id number]  History of Present Illness: [name] is a 82 y.o. male who presents today for return patient visit at [facility] [place]. - GU history: 1. BPH with BOO and LUTS (nocturia). - ***Taking Uroxatral 10 mg nightly and Proscar 5 mg daily.  2. Prior painless gross hematuria.  3. Kidney stones.  At last visit with Dr. Ronne Binning on 12/15/2022: The plan was to continue Uroxatral 10 mg nightly and Proscar 5 mg daily.   Since last visit: > 03/26/2023: Seen in ER for ***  > 03/28/2023: Seen in ER for ***  Today: He reports ***  He reports *** urinary stream. He {Actions; denies-reports:120008} urinary hesitancy, urgency, frequency, dysuria, gross hematuria, straining to void, or sensations of incomplete emptying.   Fall Screening: Do you usually have a device to assist in your mobility? {yes/no:20286} ***cane / ***walker / ***wheelchair   Medications: Current Outpatient Medications  Medication Sig Dispense Refill   alfuzosin (UROXATRAL) 10 MG 24 hr tablet TAKE 1 TABLET WITH BREAKFAST. 90 tablet 3   aspirin EC 81 MG tablet Take 81 mg by mouth every evening. Swallow whole.     cephALEXin (KEFLEX) 500 MG capsule Take 1 capsule (500 mg total) by mouth 4 (four) times daily. 28 capsule 0   finasteride (PROSCAR) 5 MG tablet Take 1 tablet (5 mg total) by mouth daily. 90 tablet 3   metroNIDAZOLE (METROGEL) 1 % gel Apply 1 application topically daily as needed (Rosacea). Apply to face     Multiple Vitamin (MULTIVITAMIN WITH MINERALS) TABS tablet Take 1 tablet by mouth daily.     nitrofurantoin, macrocrystal-monohydrate, (MACROBID) 100 MG capsule Take 1 capsule (100 mg total) by mouth 2 (two) times daily. 14 capsule 0   nystatin (MYCOSTATIN) 100000 UNIT/ML suspension Take by mouth.     Oxcarbazepine (TRILEPTAL) 300 MG tablet Take 300 mg by mouth 2 (two) times daily.     sodium chloride (OCEAN) 0.65 % nasal spray Place 1 spray into  the nose daily as needed for congestion.     SYNTHROID 50 MCG tablet Take 50 mcg by mouth daily.     zonisamide (ZONEGRAN) 100 MG capsule Take 400 mg by mouth in the morning.     No current facility-administered medications for this visit.    Allergies: Allergies  Allergen Reactions   Sulfa Antibiotics Hives and Rash    Past Medical History:  Diagnosis Date   Arthritis    History of colon polyps    removed with colonoscopy   History of kidney stones    Kidney stone    in past-has one at present, not bothersome   Seizures (HCC)    none in 10 yrs- controlled with meds(Dr. Nicholes Stairs [phone])   Past Surgical History:  Procedure Laterality Date   CHOLECYSTECTOMY  07/25/2012   Procedure: LAPAROSCOPIC CHOLECYSTECTOMY WITH INTRAOPERATIVE CHOLANGIOGRAM;  Surgeon: Axel Filler, MD;  Location: [facility];  Service: General;;   COLONOSCOPY  11/15/2011   Procedure: COLONOSCOPY;  Surgeon: Malissa Hippo, MD;  Location: [facility];  Service: Endoscopy;  Laterality: N/A;  730   COLONOSCOPY WITH PROPOFOL N/A 07/11/2022   Procedure: COLONOSCOPY WITH PROPOFOL;  Surgeon: Dolores Frame, MD;  Location: [facility];  Service: Gastroenterology;  Laterality: N/A;  730 ASA 3   ELBOW SURGERY     chipped bone right elbow   POLYPECTOMY  07/11/2022   Procedure: POLYPECTOMY INTESTINAL;  Surgeon: Dolores Frame, MD;  Location: [facility];  Service: Gastroenterology;;   TONSILLECTOMY     VASECTOMY     Family History  Family history unknown: Yes   Social History   Socioeconomic History   Marital status: Married    Spouse name: Not on file   Number of children: Not on file   Years of education: Not on file   Highest education level: Not on file  Occupational History   Not on file  Tobacco Use   Smoking status: Former    Current packs/day: 0.00    Types: Cigarettes    Quit date: 07/19/1982    Years since quitting: 40.7   Smokeless tobacco: Never  Substance  and Sexual Activity   Alcohol use: Yes    Alcohol/week: 1.0 standard drink of alcohol    Types: 1 Glasses of wine per week    Comment: mixed drink  once in awhile   Drug use: No   Sexual activity: Yes  Other Topics Concern   Not on file  Social History Narrative   Not on file   Social Determinants of Health   Financial Resource Strain: Not on file  Food Insecurity: Not on file  Transportation Needs: Not on file  Physical Activity: Not on file  Stress: Not on file  Social Connections: Not on file  Intimate Partner Violence: Not on file    Review of Systems Constitutional: Patient ***denies any unintentional weight loss or change in strength lntegumentary: Patient ***denies any rashes or pruritus Eyes: Patient denies ***dry eyes ENT: Patient ***denies dry mouth Cardiovascular: Patient ***denies chest pain or syncope Respiratory: Patient ***denies shortness of breath Gastrointestinal: Patient ***denies nausea, vomiting, constipation, or diarrhea Musculoskeletal: Patient ***denies muscle cramps or weakness Neurologic: Patient ***denies convulsions or seizures Psychiatric: Patient ***denies memory problems Allergic/Immunologic: Patient ***denies recent allergic reaction(s) Hematologic/Lymphatic: Patient denies bleeding tendencies Endocrine: Patient ***denies heat/cold intolerance  GU: As per HPI.  OBJECTIVE There were no vitals filed for this visit. There is no height or weight on file to calculate BMI.  Physical Examination Constitutional: ***No obvious distress; patient is ***non-toxic appearing  Cardiovascular: ***No visible lower extremity edema.  Respiratory: The patient does ***not have audible wheezing/stridor; respirations do ***not appear labored  Gastrointestinal: Abdomen ***non-distended Musculoskeletal: ***Normal ROM of UEs  Skin: ***No obvious rashes/open sores  Neurologic: CN 2-12 grossly ***intact Psychiatric: Answered questions ***appropriately with  ***normal affect  Hematologic/Lymphatic/Immunologic: ***No obvious bruises or sites of spontaneous bleeding  UA: ***negative / *** WBC/hpf, *** RBC/hpf, bacteria (***) PVR: *** ml  ASSESSMENT No diagnosis found. ***  Will plan for follow up in *** months / ***1 year or sooner if needed. Pt verbalized understanding and agreement. All questions were answered.  PLAN Advised the following: 1. *** 2. ***No follow-ups on file.  No orders of the defined types were placed in this encounter.   It has been explained that the patient is to follow regularly with their PCP in addition to all other providers involved in their care and to follow instructions provided by these respective offices. Patient advised to contact urology clinic if any urologic-pertaining questions, concerns, new symptoms or problems arise in the interim period.  There are no Patient Instructions on file for this visit.  Electronically signed by:  Donnita Falls, FNP   04/03/23    2:14 PM

## 2023-04-04 ENCOUNTER — Encounter: Payer: Self-pay | Admitting: Urology

## 2023-04-04 ENCOUNTER — Ambulatory Visit (INDEPENDENT_AMBULATORY_CARE_PROVIDER_SITE_OTHER): Payer: Medicare Other | Admitting: Urology

## 2023-04-04 VITALS — BP 163/83 | HR 56 | Temp 97.5°F

## 2023-04-04 DIAGNOSIS — N210 Calculus in bladder: Secondary | ICD-10-CM | POA: Diagnosis not present

## 2023-04-04 DIAGNOSIS — N138 Other obstructive and reflux uropathy: Secondary | ICD-10-CM | POA: Diagnosis not present

## 2023-04-04 DIAGNOSIS — R310 Gross hematuria: Secondary | ICD-10-CM

## 2023-04-04 DIAGNOSIS — R351 Nocturia: Secondary | ICD-10-CM | POA: Diagnosis not present

## 2023-04-04 DIAGNOSIS — N200 Calculus of kidney: Secondary | ICD-10-CM | POA: Diagnosis not present

## 2023-04-04 DIAGNOSIS — N401 Enlarged prostate with lower urinary tract symptoms: Secondary | ICD-10-CM | POA: Diagnosis not present

## 2023-04-04 MED ORDER — CIPROFLOXACIN HCL 500 MG PO TABS
500.0000 mg | ORAL_TABLET | Freq: Once | ORAL | Status: AC
  Administered 2023-04-04: 500 mg via ORAL

## 2023-04-04 NOTE — Progress Notes (Signed)
Fill and Pull Catheter Removal  Patient is present today for a catheter removal.  Patient was cleaned and prepped in a sterile fashion of sterile water/ saline was instilled into the bladder when the patient felt the urge to urinate. 10ml of water was then drained from the balloon.  A 16FR foley cath was removed from the bladder no complications were noted .  Patient as then given some time to void on their own.  Patient can void  on their own after some time.  Patient tolerated well.  Performed by: Kennyth Lose, CMA  Follow up/ Additional notes: N/A

## 2023-04-05 DIAGNOSIS — Z6825 Body mass index (BMI) 25.0-25.9, adult: Secondary | ICD-10-CM | POA: Diagnosis not present

## 2023-04-05 DIAGNOSIS — N139 Obstructive and reflux uropathy, unspecified: Secondary | ICD-10-CM | POA: Diagnosis not present

## 2023-04-05 DIAGNOSIS — Z0001 Encounter for general adult medical examination with abnormal findings: Secondary | ICD-10-CM | POA: Diagnosis not present

## 2023-04-05 DIAGNOSIS — N200 Calculus of kidney: Secondary | ICD-10-CM | POA: Diagnosis not present

## 2023-04-05 DIAGNOSIS — R030 Elevated blood-pressure reading, without diagnosis of hypertension: Secondary | ICD-10-CM | POA: Diagnosis not present

## 2023-04-05 DIAGNOSIS — N210 Calculus in bladder: Secondary | ICD-10-CM | POA: Diagnosis not present

## 2023-04-05 DIAGNOSIS — R319 Hematuria, unspecified: Secondary | ICD-10-CM | POA: Diagnosis not present

## 2023-04-05 DIAGNOSIS — G40309 Generalized idiopathic epilepsy and epileptic syndromes, not intractable, without status epilepticus: Secondary | ICD-10-CM | POA: Diagnosis not present

## 2023-04-05 DIAGNOSIS — M1712 Unilateral primary osteoarthritis, left knee: Secondary | ICD-10-CM | POA: Diagnosis not present

## 2023-04-05 DIAGNOSIS — Z1389 Encounter for screening for other disorder: Secondary | ICD-10-CM | POA: Diagnosis not present

## 2023-04-05 DIAGNOSIS — Z1331 Encounter for screening for depression: Secondary | ICD-10-CM | POA: Diagnosis not present

## 2023-04-05 DIAGNOSIS — D692 Other nonthrombocytopenic purpura: Secondary | ICD-10-CM | POA: Diagnosis not present

## 2023-04-18 ENCOUNTER — Ambulatory Visit (HOSPITAL_COMMUNITY)
Admission: RE | Admit: 2023-04-18 | Discharge: 2023-04-18 | Disposition: A | Discharge status: Home / Self Care | Payer: Medicare Other | Source: Ambulatory Visit | Attending: Urology | Admitting: Urology

## 2023-04-18 DIAGNOSIS — R310 Gross hematuria: Secondary | ICD-10-CM | POA: Diagnosis not present

## 2023-04-18 DIAGNOSIS — K4020 Bilateral inguinal hernia, without obstruction or gangrene, not specified as recurrent: Secondary | ICD-10-CM | POA: Diagnosis not present

## 2023-04-18 DIAGNOSIS — N401 Enlarged prostate with lower urinary tract symptoms: Secondary | ICD-10-CM | POA: Diagnosis not present

## 2023-04-18 DIAGNOSIS — K5750 Diverticulosis of both small and large intestine without perforation or abscess without bleeding: Secondary | ICD-10-CM | POA: Diagnosis not present

## 2023-04-18 DIAGNOSIS — N200 Calculus of kidney: Secondary | ICD-10-CM | POA: Diagnosis not present

## 2023-04-18 DIAGNOSIS — N210 Calculus in bladder: Secondary | ICD-10-CM | POA: Diagnosis not present

## 2023-04-18 DIAGNOSIS — R351 Nocturia: Secondary | ICD-10-CM | POA: Diagnosis not present

## 2023-04-18 DIAGNOSIS — N138 Other obstructive and reflux uropathy: Secondary | ICD-10-CM | POA: Diagnosis not present

## 2023-04-18 DIAGNOSIS — R590 Localized enlarged lymph nodes: Secondary | ICD-10-CM | POA: Diagnosis not present

## 2023-04-24 ENCOUNTER — Telehealth: Payer: Self-pay | Admitting: Urology

## 2023-04-24 NOTE — Telephone Encounter (Signed)
Patient called and made aware that as soon as CT reports comes back and is reviewed by provider someone will call patient regarding recommendations. Patient  voiced understanding.

## 2023-04-24 NOTE — Telephone Encounter (Signed)
Patient called regarding his results for his CT, do you want him to have an earlier appt?

## 2023-05-08 NOTE — Telephone Encounter (Signed)
Patient is aware of NP's response and will follow up as scheduled with MD.

## 2023-05-08 NOTE — Telephone Encounter (Signed)
-----   Message from Donnita Falls sent at 05/08/2023  8:26 AM EDT ----- Please let pt know that his CT showed no acute findings. He has a 5 mm bladder stone and enlarged prostate - either could be the source of his recent hematuria. He also has a non-obstructing left kidney stone and several benign renal cysts. For these urologic findings he is advised to follow up with Dr. Ronne Binning on 06/15/2023 as scheduled.  Additional non-GU findings included an abdominal wall defect (hernia) and small bilateral inguinal hernias. If those are symptomatic he is advised to follow up with General Surgery.

## 2023-06-02 IMAGING — CT CT RENAL STONE PROTOCOL
2 of 4 series · 16 of 46 positions shown, 18 images · non-contrast
Comparison: May 11, 2020.

CLINICAL DATA: Gross hematuria.

EXAM:
CT ABDOMEN AND PELVIS WITHOUT CONTRAST
TECHNIQUE: Multidetector CT imaging of the abdomen and pelvis was performed
following the standard protocol without IV contrast.

[Series 2: axial st · axial · 0.77mm/px · z∈[+940,+1310]mm · 13 of 86 slices shown, 15 images]
[im 6/86  soft-tissue]
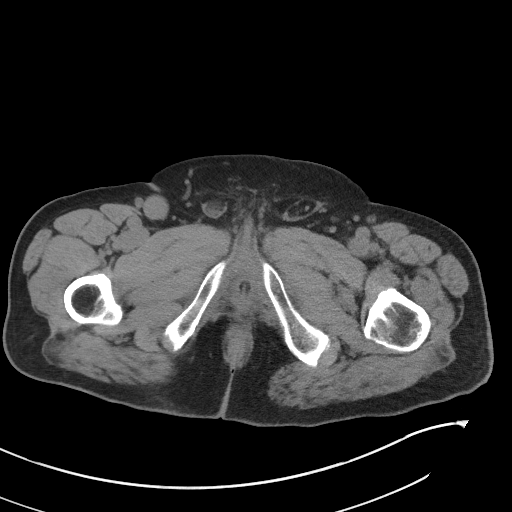
[im 6/86  bone]
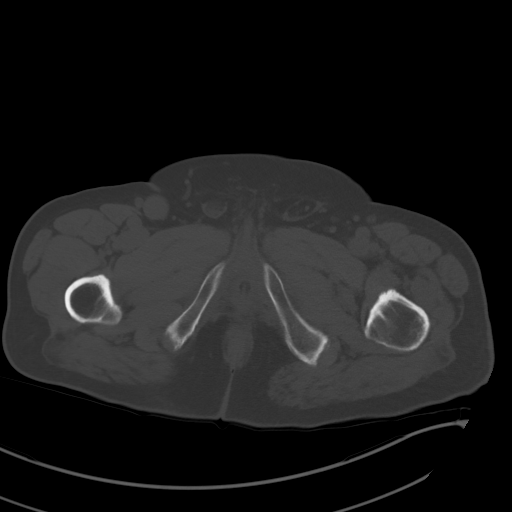
[im 12/86  soft-tissue]
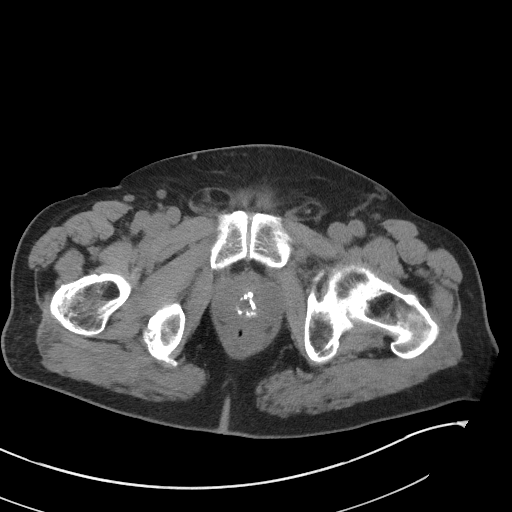
[im 18/86  soft-tissue]
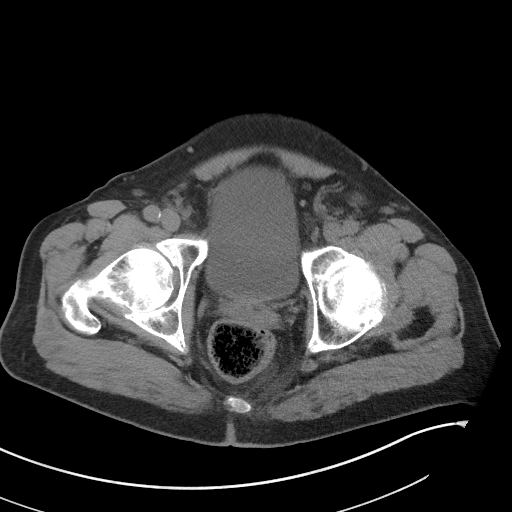
[im 23/86  soft-tissue]
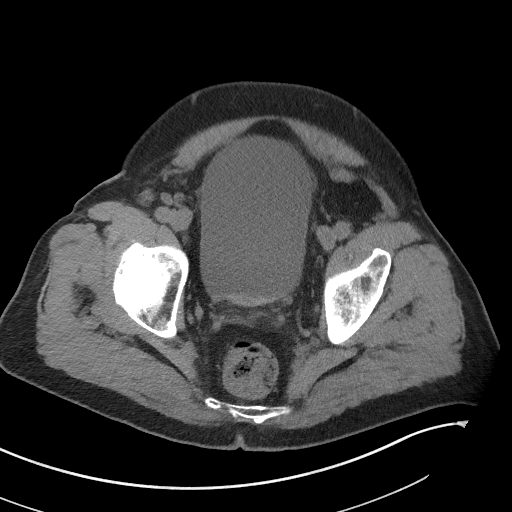
[im 29/86  soft-tissue]
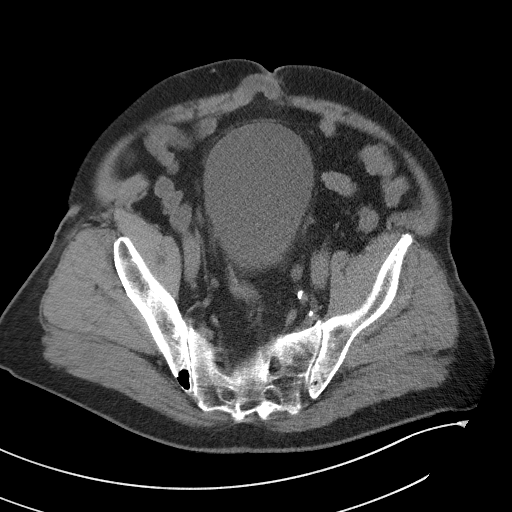
[im 35/86  soft-tissue]
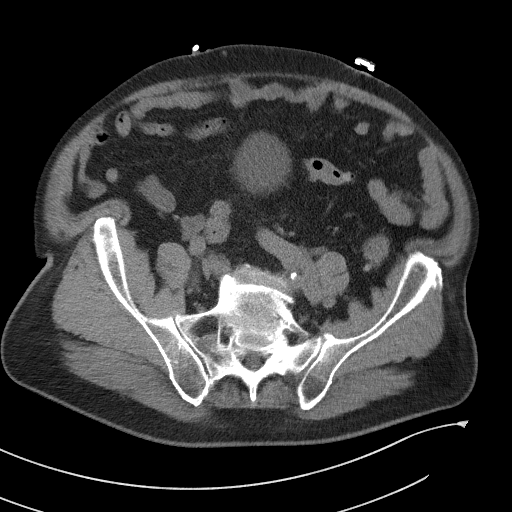
[im 46/86  soft-tissue]
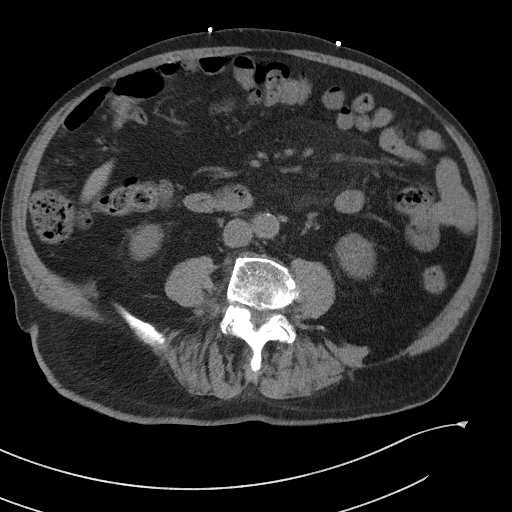
[im 52/86  soft-tissue]
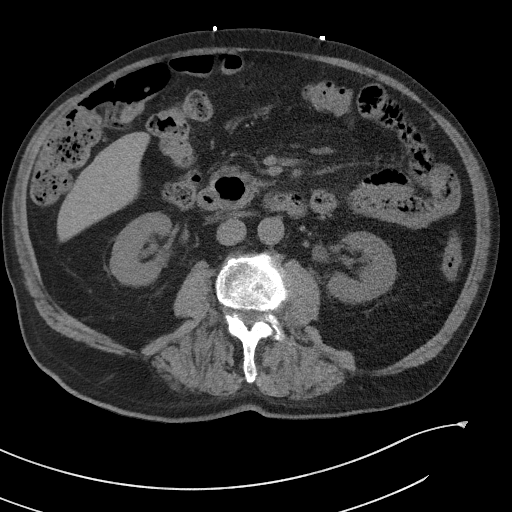
[im 57/86  soft-tissue]
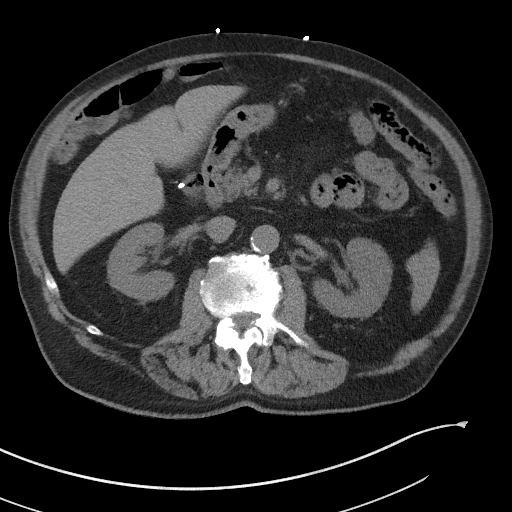
[im 57/86  bone]
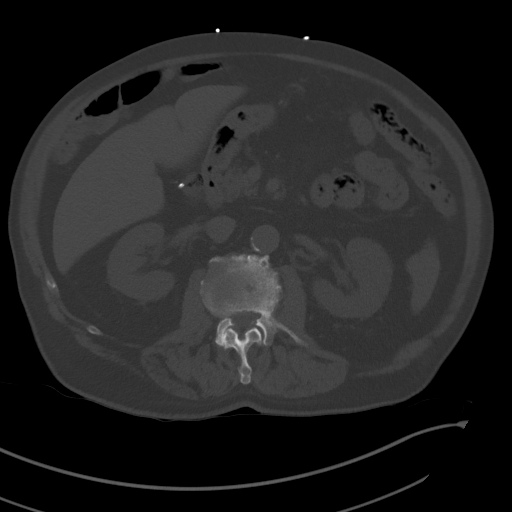
[im 63/86  soft-tissue]
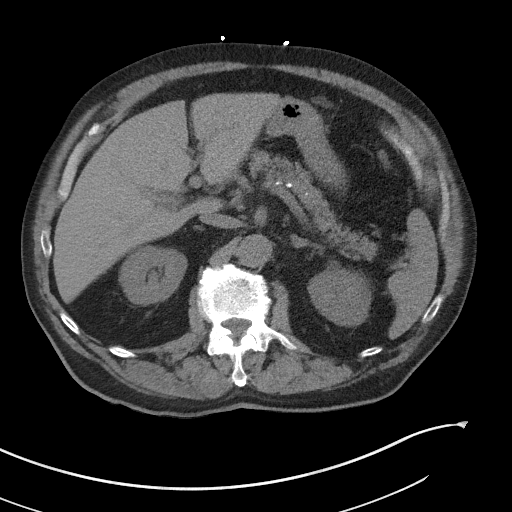
[im 69/86  soft-tissue]
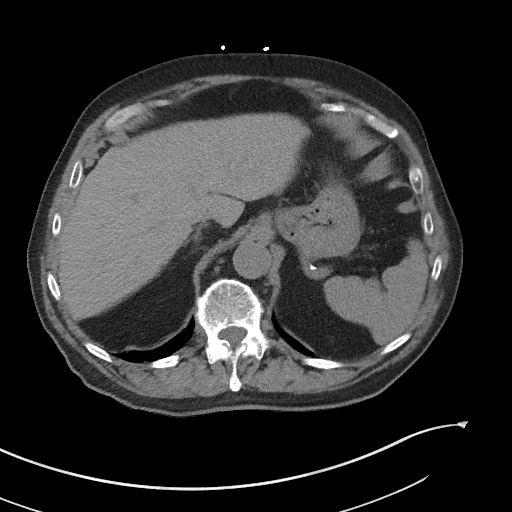
[im 74/86  soft-tissue]
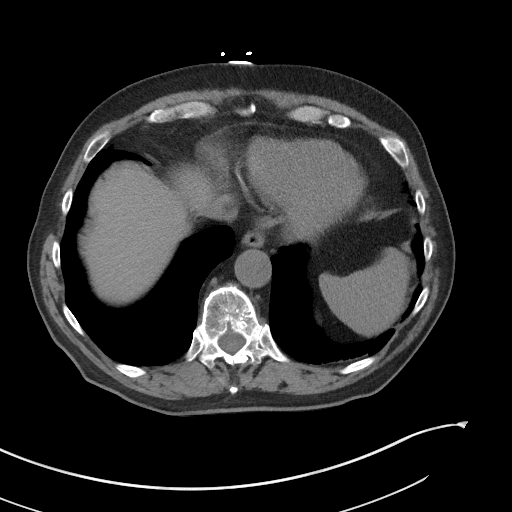
[im 80/86  soft-tissue]
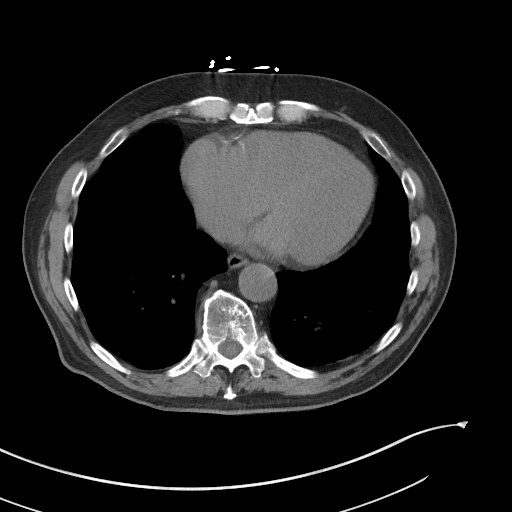

[Series 5: coronal st · coronal · 0.82mm/px · 3 of 101 slices shown]
[im 34/101  soft-tissue]
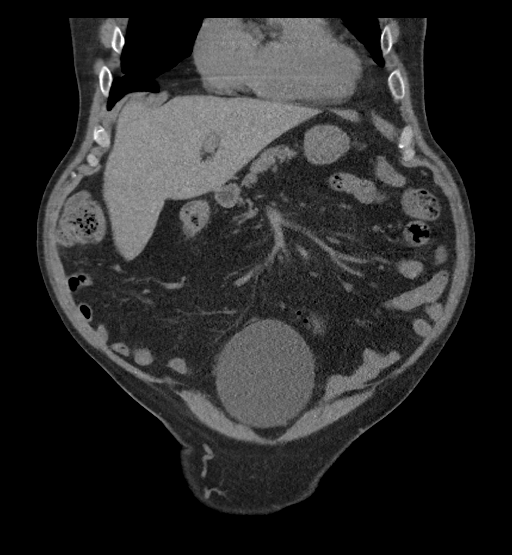
[im 45/101  soft-tissue]
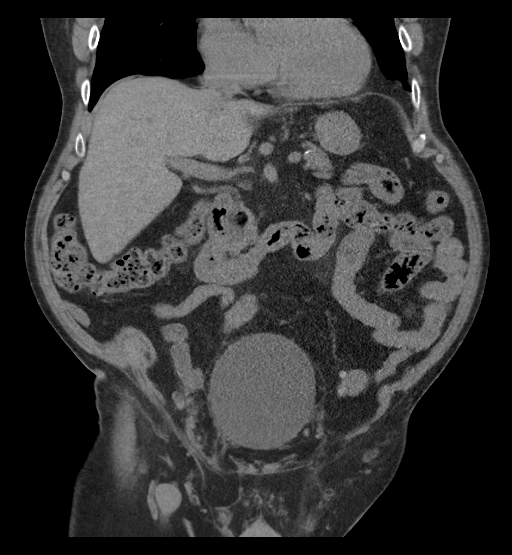
[im 56/101  soft-tissue]
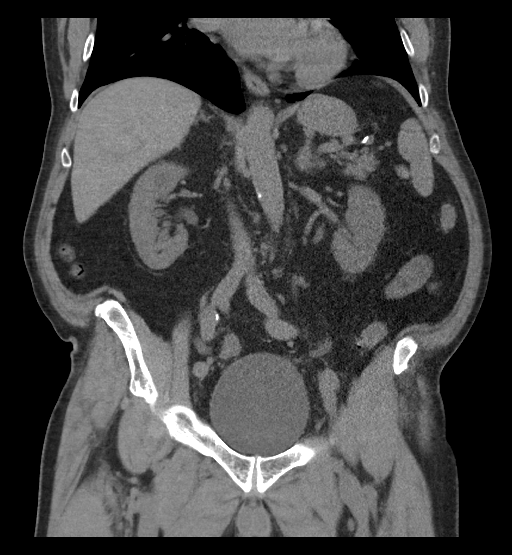

[16 of 46 positions shown; findings below may reference images not displayed]

FINDINGS: Lower chest: No acute abnormality.

Hepatobiliary: No focal liver abnormality is seen. Status post
cholecystectomy. No biliary dilatation.

Pancreas: Unremarkable. No pancreatic ductal dilatation or
surrounding inflammatory changes.

Spleen: Normal in size without focal abnormality.

Adrenals/Urinary Tract: Adrenal glands appear normal. Nonobstructive
left nephrolithiasis is noted. Minimal bilateral
hydroureteronephrosis is noted without evidence of obstructing
calculus. Multiple layering calculi are noted posteriorly in the
urinary bladder. Foley catheter appears to be inflated within the
penile portion of the urethra.

Stomach/Bowel: Stomach is within normal limits. Appendix appears
normal. No evidence of bowel wall thickening, distention, or
inflammatory changes.

Vascular/Lymphatic: Aortic atherosclerosis. No enlarged abdominal or
pelvic lymph nodes.

Reproductive: Stable mild prostatic enlargement is noted. Prostatic
calcifications are noted.

Other: Small bilateral fat containing inguinal hernias are noted.
Stable small periumbilical hernia is noted which contains a loop of
small bowel, but does not result in obstruction.

Musculoskeletal: No acute or significant osseous findings.
IMPRESSION: Foley catheter appears to be inflated within the penile urethra;
repositioning is recommended. Critical Value/emergent results were
called by telephone at the time of interpretation on 06/13/2021 at
[DATE] to provider OLIVERIO KIMBLE , who verbally acknowledged these
results.

Nonobstructive left nephrolithiasis.

Mild bilateral hydroureteronephrosis is noted without evidence of
obstructing ureteral calculus.

Mild layering calculi are noted posteriorly in the dependent portion
of the urinary bladder.

Stable mild prostatic enlargement.

Aortic Atherosclerosis ([id number]).

## 2023-06-06 ENCOUNTER — Ambulatory Visit: Payer: Medicare Other | Admitting: Urology

## 2023-06-06 VITALS — BP 145/72 | HR 61

## 2023-06-06 DIAGNOSIS — R310 Gross hematuria: Secondary | ICD-10-CM

## 2023-06-06 DIAGNOSIS — R351 Nocturia: Secondary | ICD-10-CM

## 2023-06-06 DIAGNOSIS — N401 Enlarged prostate with lower urinary tract symptoms: Secondary | ICD-10-CM

## 2023-06-06 LAB — URINALYSIS, ROUTINE W REFLEX MICROSCOPIC
Bilirubin, UA: NEGATIVE
Glucose, UA: NEGATIVE
Ketones, UA: NEGATIVE
Nitrite, UA: NEGATIVE
Protein,UA: NEGATIVE
Specific Gravity, UA: 1.015 (ref 1.005–1.030)
Urobilinogen, Ur: 0.2 mg/dL (ref 0.2–1.0)
pH, UA: 7.5 (ref 5.0–7.5)

## 2023-06-06 LAB — MICROSCOPIC EXAMINATION: Bacteria, UA: NONE SEEN

## 2023-06-06 MED ORDER — CIPROFLOXACIN HCL 500 MG PO TABS
500.0000 mg | ORAL_TABLET | Freq: Once | ORAL | Status: AC
  Administered 2023-06-06: 500 mg via ORAL

## 2023-06-06 NOTE — Progress Notes (Signed)
   06/06/23  CC: difficulty urinating   HPI: Mr Karman is a 82yo here for followup for BPH and difficulty urinating Blood pressure (!) 145/72, pulse 61. NED. A&Ox3.   No respiratory distress   Abd soft, NT, ND Normal phallus with bilateral descended testicles  Cystoscopy Procedure Note  Patient identification was confirmed, informed consent was obtained, and patient was prepped using Betadine solution.  Lidocaine jelly was administered per urethral meatus.     Pre-Procedure: - Inspection reveals a normal caliber ureteral meatus.  Procedure: The flexible cystoscope was introduced without difficulty - No urethral strictures/lesions are present. - Enlarged prostate median lobe present - Normal bladder neck - Bilateral ureteral orifices identified - Bladder mucosa  reveals no ulcers, tumors, or lesions - No bladder stones - No trabeculation  Retroflexion shows 1cm intravesical prostatic protrusion   Post-Procedure: - Patient tolerated the procedure well  Assessment/ Plan: We discussed the management of his BPH including continued medical therapy, Rezum, Urolift, TURP and simple prostatectomy. After discussing the options the patient has elected to proceed with TURP. Risks/benefits/alternatives discussed.   No follow-ups on file.  Wilkie Aye, MD

## 2023-06-12 ENCOUNTER — Encounter: Payer: Self-pay | Admitting: Urology

## 2023-06-12 NOTE — Patient Instructions (Signed)
Transurethral Resection of the Prostate Transurethral resection of the prostate (TURP) is the removal, or resection, of part of the prostate tissue. This procedure is done to treat an enlarged prostate gland (benign prostatic hyperplasia). The goal of TURP is to remove enough prostate tissue to allow for a normal flow of urine. The procedure will allow you to empty your bladder more completely when you urinate so that you can urinate less often. In a transurethral resection, a thin telescope with a light, a camera, and an electric cutting edge (resectoscope) is passed through the urethra and into the prostate. The opening of the urethra is at the end of the penis. Tell a health care provider about: Any allergies you have. All medicines you are taking, including vitamins, herbs, eye drops, creams, and over-the-counter medicines. Any problems you or family members have had with anesthetic medicines. Any bleeding problems you have. Any surgeries you have had. Any medical conditions you have. Any prostate infections you have had. What are the risks? Generally, this is a safe procedure. However, problems may occur, including: Infection. Bleeding. Allergic reactions to medicines. Blood in the urine (hematuria). Damage to nearby structures or organs. Other problems may occur, but they are rare. They include: Dry ejaculation, or having no semen come out during orgasm. Erectile dysfunction, or being unable to have or keep an erection. Scarring that leads to narrowing of the urethra. This narrowing may block the flow of urine. Inability to control when you urinate (incontinence). Deep vein thrombosis. This is a blood clot that can develop in your leg. TURP syndrome. This can happen when you lose too much sodium during or after the procedure. Some signs and symptoms of this condition include: Weakness. Headaches. Nausea or vomiting. Muscle cramping. What happens before the procedure? When to stop  eating and drinking Follow instructions from your health care provider about what you may eat and drink before your procedure. These may include: 8 hours before your procedure Stop eating most foods. Do not eat meat, fried foods, or fatty foods. Eat only light foods, such as toast or crackers. All liquids are okay except energy drinks and alcohol. 6 hours before your procedure Stop eating. Drink only clear liquids, such as water, clear fruit juice, black coffee, plain tea, and sports drinks. Do not drink energy drinks or alcohol. 2 hours before your procedure Stop drinking all liquids. You may be allowed to take medicines with small sips of water. If you do not follow your health care provider's instructions, your procedure may be delayed or canceled. Medicines Ask your health care provider about: Changing or stopping your regular medicines. This is especially important if you are taking diabetes medicines or blood thinners. Taking medicines such as aspirin and ibuprofen. These medicines can thin your blood. Do not take these medicines unless your health care provider tells you to take them. Taking over-the-counter medicines, vitamins, herbs, and supplements. Surgery safety Ask your health care provider what steps will be taken to help prevent infection. These steps may include: Removing hair at the surgery site. Washing skin with a germ-killing soap. Taking antibiotic medicine. General instructions Do not use any products that contain nicotine or tobacco for at least 4 weeks before the procedure. These products include cigarettes, chewing tobacco, and vaping devices, such as e-cigarettes. If you need help quitting, ask your health care provider. If you will be going home right after the procedure, plan to have a responsible adult: Take you home from the hospital or clinic. You  will not be allowed to drive. Care for you for the time you are told. What happens during the procedure?  An  IV will be inserted into one of your veins. You will be given one or more of the following: A medicine to help you relax (sedative). A medicine to make you fall asleep (general anesthetic). A medicine that is injected into your spine to numb the area below and slightly above the injection site (spinal anesthetic). Your legs will be placed in foot rests (stirrups) so that your legs are apart and your knees are bent. The resectoscope will be passed through your urethra to your prostate. Parts of your prostate will be resected using the cutting edge of the resectoscope. Fluid will be passed to rinse out the cut tissues (irrigation). The resectoscope will be removed. A small, thin tube (catheter) will be passed through your urethra and into your bladder. The catheter will drain urine into a bag outside of your body. The procedure may vary among health care providers and hospitals. What happens after the procedure? Your blood pressure, heart rate, breathing rate, and blood oxygen level will be monitored until you leave the hospital or clinic. You will be given fluids through the IV. The IV will be removed when you start eating and drinking normally. You may have some pain. Pain medicine will be available to help you. You will have a catheter draining your urine. You may have blood in your urine. Your catheter may be kept in until your urine is clear. Your urinary drainage will be monitored. If necessary, your bladder may be rinsed out (irrigated) through your catheter. You will be encouraged to walk around as soon as possible. You may have to wear compression stockings. These stockings help to prevent blood clots and reduce swelling in your legs. If you were given a sedative during the procedure, it can affect you for several hours. Do not drive or operate machinery until your health care provider says that it is safe. Summary Transurethral resection of the prostate (TURP) is the removal  (resection) of part of the prostate tissue. The goal of this procedure is to remove enough prostate tissue to allow for a normal flow of urine. Follow instructions from your health care provider about taking medicines and about eating and drinking before the procedure. This information is not intended to replace advice given to you by your health care provider. Make sure you discuss any questions you have with your health care provider. Document Revised: 04/05/2021 Document Reviewed: 04/05/2021 Elsevier Patient Education  2024 [employer].

## 2023-06-13 ENCOUNTER — Other Ambulatory Visit: Payer: Self-pay

## 2023-06-13 DIAGNOSIS — N401 Enlarged prostate with lower urinary tract symptoms: Secondary | ICD-10-CM

## 2023-06-13 MED ORDER — FINASTERIDE 5 MG PO TABS: 5.0000 mg | ORAL_TABLET | Freq: Every day | ORAL | 3 refills | Status: DC

## 2023-06-13 MED ORDER — ALFUZOSIN HCL ER 10 MG PO TB24: ORAL_TABLET | ORAL | 0 refills | Status: DC

## 2023-06-15 ENCOUNTER — Ambulatory Visit: Payer: Medicare Other | Admitting: Urology

## 2023-06-18 DIAGNOSIS — Z23 Encounter for immunization: Secondary | ICD-10-CM | POA: Diagnosis not present

## 2023-06-19 ENCOUNTER — Other Ambulatory Visit: Payer: Self-pay

## 2023-06-19 ENCOUNTER — Emergency Department
Admission: EM | Admit: 2023-06-19 | Discharge: 2023-06-19 | Disposition: A | Discharge status: Home / Self Care | Payer: No Typology Code available for payment source | Attending: Emergency Medicine | Admitting: Emergency Medicine

## 2023-06-19 DIAGNOSIS — R0789 Other chest pain: Secondary | ICD-10-CM | POA: Insufficient documentation

## 2023-06-19 DIAGNOSIS — R079 Chest pain, unspecified: Secondary | ICD-10-CM | POA: Diagnosis not present

## 2023-06-19 DIAGNOSIS — I10 Essential (primary) hypertension: Secondary | ICD-10-CM | POA: Diagnosis not present

## 2023-06-19 DIAGNOSIS — Y92410 Unspecified street and highway as the place of occurrence of the external cause: Secondary | ICD-10-CM | POA: Diagnosis not present

## 2023-06-19 MED ORDER — NAPROXEN 500 MG PO TABS
500.0000 mg | ORAL_TABLET | Freq: Once | ORAL | Status: AC
  Administered 2023-06-19: 500 mg via ORAL
  Filled 2023-06-19: qty 1
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
## 2023-06-19 NOTE — Discharge Instructions (Signed)
You will likely be more sore tomorrow than you are today.  You can take Tylenol, Advil or Aleve as needed for pain.  Please return to the ED if you have any new or worsening symptoms like chest pain, shortness of breath, abdominal pain or bruising.
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
## 2023-07-31 DIAGNOSIS — Z6826 Body mass index (BMI) 26.0-26.9, adult: Secondary | ICD-10-CM | POA: Diagnosis not present

## 2023-07-31 DIAGNOSIS — M1712 Unilateral primary osteoarthritis, left knee: Secondary | ICD-10-CM | POA: Diagnosis not present

## 2023-07-31 DIAGNOSIS — M25462 Effusion, left knee: Secondary | ICD-10-CM | POA: Diagnosis not present

## 2023-07-31 DIAGNOSIS — R030 Elevated blood-pressure reading, without diagnosis of hypertension: Secondary | ICD-10-CM | POA: Diagnosis not present

## 2023-08-06 NOTE — Patient Instructions (Signed)
 [name]  08/06/2023     @PREFPERIOPPHARMACY @   Your procedure is scheduled on  08/09/2023.   Report to [facility] at  0600  A.M.   Call this number if you have problems the morning of surgery:  [phone]  If you experience any cold or flu symptoms such as cough, fever, chills, shortness of breath, etc. between now and your scheduled surgery, please notify us  at the above number.   Remember:  Do not eat after midnight.    You may drink clear liquids until 0330 am on 08/09/2023.    Clear liquids allowed are:                    Water , Juice (No red color; non-citric and without pulp; diabetics please choose diet or no sugar options), Carbonated beverages (diabetics please choose diet or no sugar options), Clear Tea (No creamer, milk, or cream, including half & half and powdered creamer), Black Coffee Only (No creamer, milk or cream, including half & half and powdered creamer), and Clear Sports drink (No red color; diabetics please choose diet or no sugar options)    Take these medicines the morning of surgery with A SIP OF WATER             alfuzosin , finasteride , levothyroxine , oxcarbazepine , zonegran .     Do not wear jewelry, make-up or nail polish, including gel polish,  artificial nails, or any other type of covering on natural nails (fingers and  toes).  Do not wear lotions, powders, or perfumes, or deodorant.  Do not shave 48 hours prior to surgery.  Men may shave face and neck.  Do not bring valuables to the hospital.  [facility] is not responsible for any belongings or valuables.  Contacts, dentures or bridgework may not be worn into surgery.  Leave your suitcase in the car.  After surgery it may be brought to your room.  For patients admitted to the hospital, discharge time will be determined by your treatment team.  Patients discharged the day of surgery will not be allowed to drive home and must have someone with them for 24 hours.    Special  instructions:   DO NOT smoke tobacco or vape for 24 hours before your procedure.  Please read over the following fact sheets that you were given. Coughing and Deep Breathing, Surgical Site Infection Prevention, Anesthesia Post-op Instructions, and Care and Recovery After Surgery       Transurethral Resection of the Prostate, Care After The following information offers guidance on how to care for yourself after your procedure. Your health care provider may also give you more specific instructions. If you have problems or questions, contact your health care provider. What can I expect after the procedure? After the procedure, it is common to have: Mild pain in your lower abdomen. Soreness or mild discomfort in your penis or when you urinate. This is from having the catheter inserted during the procedure. A sudden urge to urinate (urgency). A need to urinate often. A small amount of blood in your urine. You may notice some small blood clots in your urine. These are normal. Follow these instructions at home: Medicines Take over-the-counter and prescription medicines only as told by your health care provider. If you were prescribed an antibiotic medicine, take it as told by your health care provider. Do not stop taking the antibiotic even if you start to feel better. Activity  Rest as told by  your health care provider. Avoid sitting for a long time without moving. Get up to take short walks every 1-2 hours. This is important to improve blood flow and breathing. Ask for help if you feel weak or unsteady. You may increase your physical activity gradually as you start to feel better. Do not drive or operate machinery until your health care provider says that it is safe. Do not ride in a car for long periods of time, or as told by your health care provider. Avoid intense physical activity for as long as told by your health care provider. Do not lift anything that is heavier than 10 lb (4.5 kg), or  the limit that you are told, until your health care provider says that it is safe. Do not have sex until your health care provider approves. Return to your normal activities as told by your health care provider. Ask your health care provider what activities are safe for you. Preventing constipation You may need to take these actions to prevent or treat constipation: Drink enough fluid to keep your urine pale yellow. Take over-the-counter or prescription medicines. Eat foods that are high in fiber, such as beans, whole grains, and fresh fruits and vegetables. Limit foods that are high in fat and processed sugars, such as fried or sweet foods.  General instructions Do not strain when you have a bowel movement. Straining may lead to bleeding from the prostate. This may cause blood clots and trouble urinating. Do not use any products that contain nicotine or tobacco. These products include cigarettes, chewing tobacco, and vaping devices, such as e-cigarettes. If you need help quitting, ask your health care provider. If you go home with a tube draining your urine (urinary catheter), care for the catheter as told by your health care provider. Wear compression stockings as told by your health care provider. These stockings help to prevent blood clots and reduce swelling in your legs. Keep all follow-up visits. This is important. Contact a health care provider if: You have signs of infection, such as: Fever or chills. Urine that smells very bad. Swelling around your urethra that is getting worse. Swelling in your penis or testicles. You have difficulty urinating. You have pain that gets worse or does not improve with medicine. You have blood in your urine that does not go away after 1 week of resting and drinking more fluids. You have trouble having a bowel movement. You have trouble having or keeping an erection. No semen comes out during orgasm (dry ejaculation). You have a urinary catheter in  place, and you have: Spasms or pain. Problems with your catheter or your catheter is blocked. Get help right away if: You are unable to urinate. You are having more blood clots in your urine instead of fewer. You have: Large blood clots. A lot of blood in your urine. Pain in your back or lower abdomen. You have difficulty breathing or shortness of breath. You develop swelling or pain in your leg. These symptoms may be an emergency. Get help right away. Call 911. Do not wait to see if the symptoms will go away. Do not drive yourself to the hospital. Summary After the procedure, it is common to have a small amount of blood in your urine. Follow restrictions about lifting and sexual activity as told by your health care provider. Ask what activities are safe for you. Keep all follow-up visits. This is important. This information is not intended to replace advice given to you by your  health care provider. Make sure you discuss any questions you have with your health care provider. Document Revised: 04/05/2021 Document Reviewed: 04/05/2021 Elsevier Patient Education  2024 [employer]  General Anesthesia, Adult, Care After The following information offers guidance on how to care for yourself after your procedure. Your health care provider may also give you more specific instructions. If you have problems or questions, contact your health care provider. What can I expect after the procedure? After the procedure, it is common for people to: Have pain or discomfort at the IV site. Have nausea or vomiting. Have a sore throat or hoarseness. Have trouble concentrating. Feel cold or chills. Feel weak, sleepy, or tired (fatigue). Have soreness and body aches. These can affect parts of the body that were not involved in surgery. Follow these instructions at home: For the time period you were told by your health care provider:  Rest. Do not participate in activities where you could fall or  become injured. Do not drive or use machinery. Do not drink alcohol . Do not take sleeping pills or medicines that cause drowsiness. Do not make important decisions or sign legal documents. Do not take care of children on your own. General instructions Drink enough fluid to keep your urine pale yellow. If you have sleep apnea, surgery and certain medicines can increase your risk for breathing problems. Follow instructions from your health care provider about wearing your sleep device: Anytime you are sleeping, including during daytime naps. While taking prescription pain medicines, sleeping medicines, or medicines that make you drowsy. Return to your normal activities as told by your health care provider. Ask your health care provider what activities are safe for you. Take over-the-counter and prescription medicines only as told by your health care provider. Do not use any products that contain nicotine or tobacco. These products include cigarettes, chewing tobacco, and vaping devices, such as e-cigarettes. These can delay incision healing after surgery. If you need help quitting, ask your health care provider. Contact a health care provider if: You have nausea or vomiting that does not get better with medicine. You vomit every time you eat or drink. You have pain that does not get better with medicine. You cannot urinate or have bloody urine. You develop a skin rash. You have a fever. Get help right away if: You have trouble breathing. You have chest pain. You vomit blood. These symptoms may be an emergency. Get help right away. Call 911. Do not wait to see if the symptoms will go away. Do not drive yourself to the hospital. Summary After the procedure, it is common to have a sore throat, hoarseness, nausea, vomiting, or to feel weak, sleepy, or fatigue. For the time period you were told by your health care provider, do not drive or use machinery. Get help right away if you have  difficulty breathing, have chest pain, or vomit blood. These symptoms may be an emergency. This information is not intended to replace advice given to you by your health care provider. Make sure you discuss any questions you have with your health care provider. Document Revised: 10/07/2021 Document Reviewed: 10/07/2021 Elsevier Patient Education  2024 Elsevier Inc. How to Use Chlorhexidine  Before Surgery Chlorhexidine  gluconate (CHG) is a germ-killing (antiseptic) solution that is used to clean the skin. It can get rid of the bacteria that normally live on the skin and can keep them away for about 24 hours. To clean your skin with CHG, you may be given: A CHG solution to  use in the shower or as part of a sponge bath. A prepackaged cloth that contains CHG. Cleaning your skin with CHG may help lower the risk for infection: While you are staying in the intensive care unit of the hospital. If you have a vascular access, such as a central line, to provide short-term or long-term access to your veins. If you have a catheter to drain urine from your bladder. If you are on a ventilator. A ventilator is a machine that helps you breathe by moving air in and out of your lungs. After surgery. What are the risks? Risks of using CHG include: A skin reaction. Hearing loss, if CHG gets in your ears and you have a perforated eardrum. Eye injury, if CHG gets in your eyes and is not rinsed out. The CHG product catching fire. Make sure that you avoid smoking and flames after applying CHG to your skin. Do not use CHG: If you have a chlorhexidine  allergy or have previously reacted to chlorhexidine . On babies younger than 76 months of age. How to use CHG solution Use CHG only as told by your health care provider, and follow the instructions on the label. Use the full amount of CHG as directed. Usually, this is one bottle. During a shower Follow these steps when using CHG solution during a shower (unless your  health care provider gives you different instructions): Start the shower. Use your normal soap and shampoo to wash your face and hair. Turn off the shower or move out of the shower stream. Pour the CHG onto a clean washcloth. Do not use any type of brush or rough-edged sponge. Starting at your neck, lather your body down to your toes. Make sure you follow these instructions: If you will be having surgery, pay special attention to the part of your body where you will be having surgery. Scrub this area for at least 1 minute. Do not use CHG on your head or face. If the solution gets into your ears or eyes, rinse them well with water . Avoid your genital area. Avoid any areas of skin that have broken skin, cuts, or scrapes. Scrub your back and under your arms. Make sure to wash skin folds. Let the lather sit on your skin for 1-2 minutes or as long as told by your health care provider. Thoroughly rinse your entire body in the shower. Make sure that all body creases and crevices are rinsed well. Dry off with a clean towel. Do not put any substances on your body afterward--such as powder, lotion, or perfume--unless you are told to do so by your health care provider. Only use lotions that are recommended by the manufacturer. Put on clean clothes or pajamas. If it is the night before your surgery, sleep in clean sheets.  During a sponge bath Follow these steps when using CHG solution during a sponge bath (unless your health care provider gives you different instructions): Use your normal soap and shampoo to wash your face and hair. Pour the CHG onto a clean washcloth. Starting at your neck, lather your body down to your toes. Make sure you follow these instructions: If you will be having surgery, pay special attention to the part of your body where you will be having surgery. Scrub this area for at least 1 minute. Do not use CHG on your head or face. If the solution gets into your ears or eyes, rinse  them well with water . Avoid your genital area. Avoid any areas of skin that  have broken skin, cuts, or scrapes. Scrub your back and under your arms. Make sure to wash skin folds. Let the lather sit on your skin for 1-2 minutes or as long as told by your health care provider. Using a different clean, wet washcloth, thoroughly rinse your entire body. Make sure that all body creases and crevices are rinsed well. Dry off with a clean towel. Do not put any substances on your body afterward--such as powder, lotion, or perfume--unless you are told to do so by your health care provider. Only use lotions that are recommended by the manufacturer. Put on clean clothes or pajamas. If it is the night before your surgery, sleep in clean sheets. How to use CHG prepackaged cloths Only use CHG cloths as told by your health care provider, and follow the instructions on the label. Use the CHG cloth on clean, dry skin. Do not use the CHG cloth on your head or face unless your health care provider tells you to. When washing with the CHG cloth: Avoid your genital area. Avoid any areas of skin that have broken skin, cuts, or scrapes. Before surgery Follow these steps when using a CHG cloth to clean before surgery (unless your health care provider gives you different instructions): Using the CHG cloth, vigorously scrub the part of your body where you will be having surgery. Scrub using a back-and-forth motion for 3 minutes. The area on your body should be completely wet with CHG when you are done scrubbing. Do not rinse. Discard the cloth and let the area air-dry. Do not put any substances on the area afterward, such as powder, lotion, or perfume. Put on clean clothes or pajamas. If it is the night before your surgery, sleep in clean sheets.  For general bathing Follow these steps when using CHG cloths for general bathing (unless your health care provider gives you different instructions). Use a separate CHG cloth  for each area of your body. Make sure you wash between any folds of skin and between your fingers and toes. Wash your body in the following order, switching to a new cloth after each step: The front of your neck, shoulders, and chest. Both of your arms, under your arms, and your hands. Your stomach and groin area, avoiding the genitals. Your right leg and foot. Your left leg and foot. The back of your neck, your back, and your buttocks. Do not rinse. Discard the cloth and let the area air-dry. Do not put any substances on your body afterward--such as powder, lotion, or perfume--unless you are told to do so by your health care provider. Only use lotions that are recommended by the manufacturer. Put on clean clothes or pajamas. Contact a health care provider if: Your skin gets irritated after scrubbing. You have questions about using your solution or cloth. You swallow any chlorhexidine . Call your local poison control center ([phone] in the [place] Get help right away if: Your eyes itch badly, or they become very red or swollen. Your skin itches badly and is red or swollen. Your hearing changes. You have trouble seeing. You have swelling or tingling in your mouth or throat. You have trouble breathing. These symptoms may represent a serious problem that is an emergency. Do not wait to see if the symptoms will go away. Get medical help right away. Call your local emergency services (911 in the [place] Do not drive yourself to the hospital. Summary Chlorhexidine  gluconate (CHG) is a germ-killing (antiseptic) solution that is used to clean  the skin. Cleaning your skin with CHG may help to lower your risk for infection. You may be given CHG to use for bathing. It may be in a bottle or in a prepackaged cloth to use on your skin. Carefully follow your health care provider's instructions and the instructions on the product label. Do not use CHG if you have a chlorhexidine  allergy. Contact your  health care provider if your skin gets irritated after scrubbing. This information is not intended to replace advice given to you by your health care provider. Make sure you discuss any questions you have with your health care provider. Document Revised: 11/07/2021 Document Reviewed: 09/20/2020 Elsevier Patient Education  2023 [employer].

## 2023-08-07 ENCOUNTER — Encounter (HOSPITAL_COMMUNITY): Payer: Self-pay

## 2023-08-07 ENCOUNTER — Encounter (HOSPITAL_COMMUNITY)
Admission: RE | Admit: 2023-08-07 | Discharge: 2023-08-07 | Disposition: A | Discharge status: Home / Self Care | Payer: No Typology Code available for payment source | Source: Ambulatory Visit | Attending: Urology | Admitting: Urology

## 2023-08-07 VITALS — BP 141/75 | HR 69 | Temp 97.7°F | Resp 18 | Ht 67.0 in | Wt 162.9 lb

## 2023-08-07 DIAGNOSIS — Z01812 Encounter for preprocedural laboratory examination: Secondary | ICD-10-CM | POA: Insufficient documentation

## 2023-08-07 DIAGNOSIS — Z01818 Encounter for other preprocedural examination: Secondary | ICD-10-CM

## 2023-08-07 DIAGNOSIS — N020 Recurrent and persistent hematuria with minor glomerular abnormality: Secondary | ICD-10-CM | POA: Diagnosis not present

## 2023-08-07 DIAGNOSIS — R310 Gross hematuria: Secondary | ICD-10-CM

## 2023-08-07 HISTORY — DX: Hypothyroidism, unspecified: E03.9

## 2023-08-07 LAB — CBC WITH DIFFERENTIAL/PLATELET
Abs Immature Granulocytes: 0.03 10*3/uL (ref 0.00–0.07)
Basophils Absolute: 0 10*3/uL (ref 0.0–0.1)
Basophils Relative: 0 %
Eosinophils Absolute: 0.1 10*3/uL (ref 0.0–0.5)
Eosinophils Relative: 2 %
HCT: 40.5 % (ref 39.0–52.0)
Hemoglobin: 13 g/dL (ref 13.0–17.0)
Immature Granulocytes: 1 %
Lymphocytes Relative: 26 %
Lymphs Abs: 1.3 10*3/uL (ref 0.7–4.0)
MCH: 29.9 pg (ref 26.0–34.0)
MCHC: 32.1 g/dL (ref 30.0–36.0)
MCV: 93.1 fL (ref 80.0–100.0)
Monocytes Absolute: 0.4 10*3/uL (ref 0.1–1.0)
Monocytes Relative: 7 %
Neutro Abs: 3.2 10*3/uL (ref 1.7–7.7)
Neutrophils Relative %: 64 %
Platelets: 187 10*3/uL (ref 150–400)
RBC: 4.35 MIL/uL (ref 4.22–5.81)
RDW: 12.5 % (ref 11.5–15.5)
WBC: 5 10*3/uL (ref 4.0–10.5)
nRBC: 0 % (ref 0.0–0.2)

## 2023-08-07 LAB — BASIC METABOLIC PANEL
Anion gap: 8 (ref 5–15)
BUN: 15 mg/dL (ref 8–23)
CO2: 23 mmol/L (ref 22–32)
Calcium: 9.8 mg/dL (ref 8.9–10.3)
Chloride: 104 mmol/L (ref 98–111)
Creatinine, Ser: 0.97 mg/dL (ref 0.61–1.24)
GFR, Estimated: >60 mL/min (ref >60)
Glucose, Bld: 132 mg/dL — ABNORMAL HIGH (ref 70–99)
Potassium: 4.1 mmol/L (ref 3.5–5.1)
Sodium: 135 mmol/L (ref 135–145)

## 2023-08-08 NOTE — Anesthesia Preprocedure Evaluation (Addendum)
Anesthesia Evaluation  Patient identified by MRN, date of birth, ID band Patient awake    Reviewed: Allergy & Precautions, H&P , NPO status , Patient's Chart, lab work & pertinent test results, reviewed documented beta blocker date and time   Airway Mallampati: II  TM Distance: >3 FB Neck ROM: full    Dental  (+) Caps, Dental Advisory Given Upper front teeth are capped:   Pulmonary former smoker   Pulmonary exam normal breath sounds clear to auscultation       Cardiovascular Exercise Tolerance: Good negative cardio ROS Normal cardiovascular exam Rhythm:regular Rate:Normal     Neuro/Psych Seizures -, Well Controlled,   Neuromuscular disease  negative psych ROS   GI/Hepatic negative GI ROS, Neg liver ROS,,,  Endo/Other  Hypothyroidism    Renal/GU negative Renal ROS  negative genitourinary   Musculoskeletal  (+) Arthritis , Osteoarthritis,    Abdominal Normal abdominal exam  (+)   Peds  Hematology negative hematology ROS (+)   Anesthesia Other Findings   Reproductive/Obstetrics negative OB ROS                             Anesthesia Physical Anesthesia Plan  ASA: 2  Anesthesia Plan: General   Post-op Pain Management: Dilaudid IV   Induction: Intravenous  PONV Risk Score and Plan: Ondansetron, Dexamethasone and Midazolam  Airway Management Planned: LMA  Additional Equipment: None  Intra-op Plan:   Post-operative Plan: Extubation in OR  Informed Consent: I have reviewed the patients History and Physical, chart, labs and discussed the procedure including the risks, benefits and alternatives for the proposed anesthesia with the patient or authorized representative who has indicated his/her understanding and acceptance.     Dental Advisory Given  Plan Discussed with: CRNA  Anesthesia Plan Comments:        Anesthesia Quick Evaluation

## 2023-08-09 ENCOUNTER — Encounter (HOSPITAL_COMMUNITY)
Admission: RE | Disposition: A | Discharge status: Home / Self Care | Payer: Self-pay | Source: Home / Self Care | Attending: Urology

## 2023-08-09 ENCOUNTER — Ambulatory Visit (HOSPITAL_COMMUNITY): Payer: Self-pay | Admitting: Anesthesiology

## 2023-08-09 ENCOUNTER — Observation Stay (HOSPITAL_COMMUNITY)
Admission: RE | Admit: 2023-08-09 | Discharge: 2023-08-10 | Disposition: A | Discharge status: Home / Self Care | Payer: Medicare Other | Attending: Urology | Admitting: Urology

## 2023-08-09 ENCOUNTER — Ambulatory Visit (HOSPITAL_BASED_OUTPATIENT_CLINIC_OR_DEPARTMENT_OTHER): Payer: Self-pay | Admitting: Anesthesiology

## 2023-08-09 ENCOUNTER — Encounter (HOSPITAL_COMMUNITY): Payer: Self-pay | Admitting: Urology

## 2023-08-09 DIAGNOSIS — R310 Gross hematuria: Secondary | ICD-10-CM | POA: Diagnosis not present

## 2023-08-09 DIAGNOSIS — N400 Enlarged prostate without lower urinary tract symptoms: Principal | ICD-10-CM | POA: Diagnosis present

## 2023-08-09 DIAGNOSIS — E039 Hypothyroidism, unspecified: Secondary | ICD-10-CM | POA: Insufficient documentation

## 2023-08-09 DIAGNOSIS — Z7982 Long term (current) use of aspirin: Secondary | ICD-10-CM | POA: Diagnosis not present

## 2023-08-09 DIAGNOSIS — N138 Other obstructive and reflux uropathy: Secondary | ICD-10-CM | POA: Diagnosis not present

## 2023-08-09 DIAGNOSIS — N401 Enlarged prostate with lower urinary tract symptoms: Secondary | ICD-10-CM | POA: Diagnosis not present

## 2023-08-09 DIAGNOSIS — N420 Calculus of prostate: Secondary | ICD-10-CM

## 2023-08-09 DIAGNOSIS — N210 Calculus in bladder: Secondary | ICD-10-CM | POA: Insufficient documentation

## 2023-08-09 DIAGNOSIS — N320 Bladder-neck obstruction: Secondary | ICD-10-CM | POA: Diagnosis not present

## 2023-08-09 DIAGNOSIS — Z87891 Personal history of nicotine dependence: Secondary | ICD-10-CM | POA: Insufficient documentation

## 2023-08-09 DIAGNOSIS — R351 Nocturia: Secondary | ICD-10-CM | POA: Insufficient documentation

## 2023-08-09 DIAGNOSIS — Z79899 Other long term (current) drug therapy: Secondary | ICD-10-CM | POA: Insufficient documentation

## 2023-08-09 HISTORY — PX: HOLMIUM LASER APPLICATION: SHX5852

## 2023-08-09 HISTORY — PX: CYSTOSCOPY WITH LITHOLAPAXY: SHX1425

## 2023-08-09 HISTORY — PX: TRANSURETHRAL RESECTION OF PROSTATE: SHX73

## 2023-08-09 LAB — CBC
HCT: 37.7 % — ABNORMAL LOW (ref 39.0–52.0)
Hemoglobin: 12.4 g/dL — ABNORMAL LOW (ref 13.0–17.0)
MCH: 31.6 pg (ref 26.0–34.0)
MCHC: 32.9 g/dL (ref 30.0–36.0)
MCV: 96.2 fL (ref 80.0–100.0)
Platelets: 156 10*3/uL (ref 150–400)
RBC: 3.92 MIL/uL — ABNORMAL LOW (ref 4.22–5.81)
RDW: 12.8 % (ref 11.5–15.5)
WBC: 5.1 10*3/uL (ref 4.0–10.5)
nRBC: 0 % (ref 0.0–0.2)

## 2023-08-09 LAB — BASIC METABOLIC PANEL
Anion gap: 8 (ref 5–15)
BUN: 17 mg/dL (ref 8–23)
CO2: 20 mmol/L — ABNORMAL LOW (ref 22–32)
Calcium: 9.1 mg/dL (ref 8.9–10.3)
Chloride: 106 mmol/L (ref 98–111)
Creatinine, Ser: 0.95 mg/dL (ref 0.61–1.24)
GFR, Estimated: >60 mL/min (ref >60)
Glucose, Bld: 103 mg/dL — ABNORMAL HIGH (ref 70–99)
Potassium: 4.1 mmol/L (ref 3.5–5.1)
Sodium: 134 mmol/L — ABNORMAL LOW (ref 135–145)

## 2023-08-09 SURGERY — CYSTOSCOPY, WITH BLADDER CALCULUS LITHOLAPAXY
Anesthesia: General | Site: Prostate

## 2023-08-09 MED ORDER — SODIUM CHLORIDE 0.9 % IV SOLN: INTRAVENOUS | Status: DC | PRN

## 2023-08-09 MED ORDER — CHLORHEXIDINE GLUCONATE 0.12 % MT SOLN
15.0000 mL | Freq: Once | OROMUCOSAL | Status: AC
  Administered 2023-08-09: 15 mL via OROMUCOSAL
  Filled 2023-08-09: qty 15

## 2023-08-09 MED ORDER — DIPHENHYDRAMINE HCL 12.5 MG/5ML PO ELIX: 12.5000 mg | ORAL_SOLUTION | Freq: Four times a day (QID) | ORAL | Status: DC | PRN

## 2023-08-09 MED ORDER — SODIUM CHLORIDE 0.9 % IR SOLN
3000.0000 mL | Status: DC
  Administered 2023-08-09 (×3): 3000 mL

## 2023-08-09 MED ORDER — SODIUM CHLORIDE 0.9 % IV SOLN
2.0000 g | INTRAVENOUS | Status: AC
  Administered 2023-08-09: 2 g via INTRAVENOUS
  Filled 2023-08-09: qty 20

## 2023-08-09 MED ORDER — HYDROMORPHONE HCL 1 MG/ML IJ SOLN: 0.2500 mg | INTRAMUSCULAR | Status: DC | PRN

## 2023-08-09 MED ORDER — DEXAMETHASONE SODIUM PHOSPHATE 10 MG/ML IJ SOLN
INTRAMUSCULAR | Status: AC
  Filled 2023-08-09: qty 1

## 2023-08-09 MED ORDER — SENNOSIDES-DOCUSATE SODIUM 8.6-50 MG PO TABS
2.0000 | ORAL_TABLET | Freq: Every day | ORAL | Status: DC
  Filled 2023-08-09: qty 2

## 2023-08-09 MED ORDER — OXYCODONE HCL 5 MG PO TABS: 5.0000 mg | ORAL_TABLET | Freq: Once | ORAL | Status: DC | PRN

## 2023-08-09 MED ORDER — PROPOFOL 10 MG/ML IV BOLUS
INTRAVENOUS | Status: DC | PRN
  Administered 2023-08-09: 150 mg via INTRAVENOUS

## 2023-08-09 MED ORDER — ONDANSETRON HCL 4 MG/2ML IJ SOLN
4.0000 mg | INTRAMUSCULAR | Status: DC | PRN
Start: 2023-08-09 — End: 2023-08-10

## 2023-08-09 MED ORDER — ZOLPIDEM TARTRATE 5 MG PO TABS: 5.0000 mg | ORAL_TABLET | Freq: Every evening | ORAL | Status: DC | PRN

## 2023-08-09 MED ORDER — DEXTROSE-SODIUM CHLORIDE 5-0.45 % IV SOLN: INTRAVENOUS | Status: DC

## 2023-08-09 MED ORDER — ZONISAMIDE 100 MG PO CAPS
400.0000 mg | ORAL_CAPSULE | Freq: Every morning | ORAL | Status: DC
  Filled 2023-08-09 (×4): qty 4

## 2023-08-09 MED ORDER — PHENYLEPHRINE 80 MCG/ML (10ML) SYRINGE FOR IV PUSH (FOR BLOOD PRESSURE SUPPORT)
PREFILLED_SYRINGE | INTRAVENOUS | Status: AC
  Filled 2023-08-09: qty 10

## 2023-08-09 MED ORDER — ORAL CARE MOUTH RINSE: 15.0000 mL | Freq: Once | OROMUCOSAL | Status: AC

## 2023-08-09 MED ORDER — OXYBUTYNIN CHLORIDE 5 MG PO TABS: 5.0000 mg | ORAL_TABLET | Freq: Three times a day (TID) | ORAL | Status: DC | PRN

## 2023-08-09 MED ORDER — PROPOFOL 10 MG/ML IV BOLUS
INTRAVENOUS | Status: AC
  Filled 2023-08-09: qty 20

## 2023-08-09 MED ORDER — ONDANSETRON HCL 4 MG/2ML IJ SOLN: 4.0000 mg | Freq: Once | INTRAMUSCULAR | Status: DC | PRN

## 2023-08-09 MED ORDER — DEXAMETHASONE SODIUM PHOSPHATE 10 MG/ML IJ SOLN
INTRAMUSCULAR | Status: DC | PRN
  Administered 2023-08-09: 4 mg via INTRAVENOUS

## 2023-08-09 MED ORDER — OXCARBAZEPINE 300 MG PO TABS
300.0000 mg | ORAL_TABLET | Freq: Two times a day (BID) | ORAL | Status: DC
  Filled 2023-08-09 (×2): qty 1

## 2023-08-09 MED ORDER — LACTATED RINGERS IV SOLN: INTRAVENOUS | Status: DC

## 2023-08-09 MED ORDER — ONDANSETRON HCL 4 MG/2ML IJ SOLN
INTRAMUSCULAR | Status: DC | PRN
  Administered 2023-08-09: 4 mg via INTRAVENOUS

## 2023-08-09 MED ORDER — ACETAMINOPHEN 325 MG PO TABS: 650.0000 mg | ORAL_TABLET | ORAL | Status: DC | PRN

## 2023-08-09 MED ORDER — FENTANYL CITRATE (PF) 100 MCG/2ML IJ SOLN
INTRAMUSCULAR | Status: DC | PRN
  Administered 2023-08-09 (×4): 25 ug via INTRAVENOUS

## 2023-08-09 MED ORDER — FENTANYL CITRATE (PF) 100 MCG/2ML IJ SOLN
INTRAMUSCULAR | Status: AC
  Filled 2023-08-09: qty 2

## 2023-08-09 MED ORDER — WATER FOR IRRIGATION, STERILE IR SOLN
Status: DC | PRN
  Administered 2023-08-09: 500 mL

## 2023-08-09 MED ORDER — SODIUM CHLORIDE 0.9 % IR SOLN
Status: DC | PRN
  Administered 2023-08-09: 3000 mL via INTRAVESICAL
  Administered 2023-08-09: 3000 mL
  Administered 2023-08-09 (×4): 3000 mL via INTRAVESICAL

## 2023-08-09 MED ORDER — FINASTERIDE 5 MG PO TABS
5.0000 mg | ORAL_TABLET | Freq: Every day | ORAL | Status: DC
  Filled 2023-08-09: qty 1

## 2023-08-09 MED ORDER — LEVOTHYROXINE SODIUM 75 MCG PO TABS: 75.0000 ug | ORAL_TABLET | Freq: Every day | ORAL | Status: DC

## 2023-08-09 MED ORDER — OXYCODONE HCL 5 MG/5ML PO SOLN
5.0000 mg | Freq: Once | ORAL | Status: DC | PRN
Start: 2023-08-09 — End: 2023-08-09

## 2023-08-09 MED ORDER — HYDROCODONE-ACETAMINOPHEN 5-325 MG PO TABS: 1.0000 | ORAL_TABLET | ORAL | Status: DC | PRN

## 2023-08-09 MED ORDER — LIDOCAINE HCL (PF) 2 % IJ SOLN
INTRAMUSCULAR | Status: AC
  Filled 2023-08-09: qty 10

## 2023-08-09 MED ORDER — DIPHENHYDRAMINE HCL 50 MG/ML IJ SOLN: 12.5000 mg | Freq: Four times a day (QID) | INTRAMUSCULAR | Status: DC | PRN

## 2023-08-09 MED ORDER — LIDOCAINE HCL (CARDIAC) PF 100 MG/5ML IV SOSY
PREFILLED_SYRINGE | INTRAVENOUS | Status: DC | PRN
  Administered 2023-08-09: 100 mg via INTRAVENOUS

## 2023-08-09 MED ORDER — FENTANYL CITRATE PF 50 MCG/ML IJ SOSY: 25.0000 ug | PREFILLED_SYRINGE | INTRAMUSCULAR | Status: DC | PRN

## 2023-08-09 MED ORDER — ONDANSETRON HCL 4 MG/2ML IJ SOLN
INTRAMUSCULAR | Status: AC
  Filled 2023-08-09: qty 4

## 2023-08-09 SURGICAL SUPPLY — 24 items
BAG DRAIN URO TABLE W/ADPT NS (BAG) ×4 IMPLANT
BAG HAMPER (MISCELLANEOUS) ×4 IMPLANT
BAG URINE DRAIN TURP 4L (OSTOMY) ×4 IMPLANT
CATH FOLEY 3WAY 30CC 22F (CATHETERS) ×4 IMPLANT
CLOTH BEACON ORANGE TIMEOUT ST (SAFETY) ×4 IMPLANT
ELECT REM PT RETURN 9FT ADLT (ELECTROSURGICAL) ×3
ELECTRODE REM PT RTRN 9FT ADLT (ELECTROSURGICAL) ×3 IMPLANT
GLOVE BIO SURGEON STRL SZ8 (GLOVE) ×4 IMPLANT
GLOVE BIOGEL PI IND STRL 7.0 (GLOVE) ×8 IMPLANT
GOWN STRL REUS W/TWL LRG LVL3 (GOWN DISPOSABLE) ×8 IMPLANT
GOWN STRL REUS W/TWL XL LVL3 (GOWN DISPOSABLE) ×4 IMPLANT
IV NS IRRIG 3000ML ARTHROMATIC (IV SOLUTION) ×17 IMPLANT
KIT TURNOVER CYSTO (KITS) ×4 IMPLANT
LOOP CUT BIPOLAR 24F LRG (ELECTROSURGICAL) ×4 IMPLANT
MANIFOLD NEPTUNE II (INSTRUMENTS) ×4 IMPLANT
PACK CYSTO (CUSTOM PROCEDURE TRAY) ×4 IMPLANT
PAD ARMBOARD 7.5X6 YLW CONV (MISCELLANEOUS) ×4 IMPLANT
POSITIONER HEAD 8X9X4 ADT (SOFTGOODS) ×4 IMPLANT
SYR 30ML LL (SYRINGE) ×4 IMPLANT
SYR TOOMEY IRRIG 70ML (MISCELLANEOUS) ×3
SYRINGE TOOMEY IRRIG 70ML (MISCELLANEOUS) ×3 IMPLANT
TOWEL OR 17X26 4PK STRL BLUE (TOWEL DISPOSABLE) ×4 IMPLANT
TRACTIP FLEXIVA PULS ID 200XHI (Laser) ×1 IMPLANT
WATER STERILE IRR 500ML POUR (IV SOLUTION) ×4 IMPLANT

## 2023-08-09 NOTE — Progress Notes (Signed)
   08/09/23 1527  TOC Brief Assessment  Insurance and Status Reviewed  Patient has primary care physician Yes  Home environment has been reviewed Single Family Home with spouse  Prior level of function: Independent  Prior/Current Home Services No current home services  Social Drivers of Health Review SDOH reviewed no interventions necessary  Readmission risk has been reviewed Yes  Transition of care needs no transition of care needs at this time    Transition of Care Department [facility]) has reviewed patient and no TOC needs have been identified at this time. We will continue to monitor patient advancement through interdisciplinary progression rounds. If new patient transition needs arise, please place a TOC consult.

## 2023-08-09 NOTE — Anesthesia Procedure Notes (Signed)
Procedure Name: LMA Insertion Date/Time: 08/09/2023 7:41 AM  Performed by: Lendon Ka, CRNAPre-anesthesia Checklist: Patient identified, Emergency Drugs available, Suction available and Patient being monitored Patient Re-evaluated:Patient Re-evaluated prior to induction Oxygen Delivery Method: Circle System Utilized Preoxygenation: Pre-oxygenation with 100% oxygen Induction Type: IV induction Ventilation: Mask ventilation without difficulty LMA: LMA inserted LMA Size: 5.0 Number of attempts: 1 Airway Equipment and Method: Bite block Placement Confirmation: positive ETCO2 Tube secured with: Tape Dental Injury: Teeth and Oropharynx as per pre-operative assessment  Comments: With ease.

## 2023-08-09 NOTE — Anesthesia Postprocedure Evaluation (Signed)
Anesthesia Post Note  Patient: [name]  Procedure(s) Performed: TRANSURETHRAL RESECTION OF THE PROSTATE (TURP) (Prostate) CYSTOSCOPY WITH LITHOLAPAXY HOLMIUM LASER APPLICATION (Bladder)  Patient location during evaluation: PACU Anesthesia Type: General Level of consciousness: awake and alert Pain management: pain level controlled Vital Signs Assessment: post-procedure vital signs reviewed and stable Respiratory status: spontaneous breathing, nonlabored ventilation, respiratory function stable and patient connected to nasal cannula oxygen Cardiovascular status: blood pressure returned to baseline and stable Postop Assessment: no apparent nausea or vomiting Anesthetic complications: no   There were no known notable events for this encounter.   Last Vitals:  Vitals:   08/09/23 1030 08/09/23 1045  BP: 121/67 135/71  Pulse:  80  Resp: 16 14  Temp:    SpO2: 98% 99%    Last Pain:  Vitals:   08/09/23 1045  TempSrc:   PainSc: 0-No pain                 Alyssa Mancera L Claudio Mondry

## 2023-08-09 NOTE — Op Note (Signed)
Preoperative diagnosis: BPH  Postoperative diagnosis: BPH, bladder calculi  Procedure: 1 cystoscopy 2. Transurethral resection of the prostate 3. Cystolithalopaxy for a stone greater than 2.5cm  Attending: Wilkie Aye  Anesthesia: General  Estimated blood loss: Minimal  Drains: 22 French foley  Specimens: 1. Prostate Chips 2. Bladder calculi  Antibiotics: Rocephin  Findings: Trilobar prostate enlargement. Ureteral orifices in normal anatomic location. Numerous bladder and prostatic calculi up to 2.8cm  Indications: Patient is a 83 year old male with a history of BPH and elevated PVR.  After discussing treatment options, they decided proceed with transurethral resection of the prostate.  Procedure in detail: The patient was brought to the operating room and a brief timeout was done to ensure correct patient, correct procedure, correct site.  General anesthesia was administered patient was placed in dorsal lithotomy position.  Their genitalia was then prepped and draped in usual sterile fashion.  A rigid 22 French cystoscope was passed in the urethra and the bladder.  Bladder was inspected and we noted no masses or lesions.  the ureteral orifices were in the normal orthotopic locations. We noted a 2.8cm bladder calculus and several small calculi. We removed the cystoscope and placed a resectoscope into the bladder. We then turned our attention to the prostate resection. Using the bipolar resectoscope we resected the median lobe first from the bladder neck to the verumontanum. We then started at the 12 oclock position on the left lobe and resection to the 6 o'clock position from the bladder neck to the verumontanum. We then did the same resection of the right lobe. Once the resection was complete we then cauterized individual bleeders. We then unroofed numerous prostatic calculi which were placed into the bladder. Using a 242nm laser fiber the stones were fragmented and then irrigated from  the bladder. We then removed the prostate chips and sent them for pathology.  We then re-inspected the prostatic fossa and found no residual bleeding.  the bladder was then drained, a 22 French foley was placed and this concluded the procedure which was well tolerated by patient.  Complications: None  Condition: Stable, extubated, transferred to PACU  Plan: Patient is admitted overnight with continuous bladder irrigation. If their urine is clear tomorrow they will be discharged home and followup in 5 days for foley catheter removal and pathology discussion.

## 2023-08-09 NOTE — Plan of Care (Signed)
  Problem: Education: Goal: Knowledge of General Education information will improve Description: Including pain rating scale, medication(s)/side effects and non-pharmacologic comfort measures Outcome: Progressing   Problem: Elimination: Goal: Will not experience complications related to bowel motility Outcome: Progressing   

## 2023-08-09 NOTE — Transfer of Care (Signed)
Immediate Anesthesia Transfer of Care Note  Patient: [name]  Procedure(s) Performed: TRANSURETHRAL RESECTION OF THE PROSTATE (TURP) (Prostate) CYSTOSCOPY WITH LITHOLAPAXY HOLMIUM LASER APPLICATION (Bladder)  Patient Location: PACU  Anesthesia Type:General  Level of Consciousness: awake, alert , oriented, and patient cooperative  Airway & Oxygen Therapy: Patient Spontanous Breathing and Patient connected to nasal cannula oxygen  Post-op Assessment: Report given to RN, Post -op Vital signs reviewed and stable, and Patient moving all extremities X 4  Post vital signs: Reviewed and stable  Last Vitals:  Vitals Value Taken Time  BP 110/65 08/09/23 0846  Temp 98.2 08/09/23 0848    Pulse 66 08/09/23 0848  Resp 11 08/09/23 0848  SpO2 92 % 08/09/23 0848  Vitals shown include unfiled device data.  Last Pain:  Vitals:   08/09/23 0639  TempSrc: Oral  PainSc: 0-No pain         Complications: No notable events documented.

## 2023-08-09 NOTE — H&P (Signed)
History of Present Illness: [name] is a 83 y.o. male who presents today for return patient visit at [facility]. He is accompanied by his wife Gigi Gin. - GU history: 1. BPH with BOO and LUTS (nocturia). - Taking Uroxatral 10 mg nightly and Proscar 5 mg daily.  2. Prior painless gross hematuria.  - He is a former smoker. - 07/07/2020: Cystoscopy by Dr. Ronne Binning showed prostatomegaly with elevated bladder neck and numerous 2-4 mm bladder stones; no ulcers, tumors, or lesions. 3. Kidney stones.   At last visit with Dr. Ronne Binning on 12/15/2022: The plan was to continue Uroxatral 10 mg nightly and Proscar 5 mg daily.    Since last visit: > 03/26/2023: Seen in ER for gross hematuria x2 days.  - Normal renal function (GFR >60; creatinine 1.00).  - Hemoglobin 12.6.  - Urine microscopy: >50 RBC/hpf, no WBC or bacteria.  - He was prescribed Keflex 500 mg 4x/day x7 days for suspected UTI, however urine culture came back negative.   > 03/28/2023: Returned to ER for urinary retention.  - Urine microscopy: >50 RBC/hpf, >50 WBC/hpf, few bacteria.  - Bladder scan showed >200 mL of urine retained.   - Foley catheter placed with immediate urine return.     Today: He reports that the gross hematuria resolved about 5 days ago; has been having clear yellow urine since then.  He denies flank or abdominal pain.  He denies fevers or nausea/vomiting. He finished the Keflex course yesterday.   Fall Screening: Do you usually have a device to assist in your mobility? No    Medications:       Current Outpatient Medications  Medication Sig Dispense Refill   alfuzosin (UROXATRAL) 10 MG 24 hr tablet TAKE 1 TABLET WITH BREAKFAST. 90 tablet 3   aspirin EC 81 MG tablet Take 81 mg by mouth every evening. Swallow whole.       finasteride (PROSCAR) 5 MG tablet Take 1 tablet (5 mg total) by mouth daily. 90 tablet 3   Multiple Vitamin (MULTIVITAMIN WITH MINERALS) TABS tablet Take 1 tablet by mouth  daily.       Oxcarbazepine (TRILEPTAL) 300 MG tablet Take 300 mg by mouth 2 (two) times daily.       sodium chloride (OCEAN) 0.65 % nasal spray Place 1 spray into the nose daily as needed for congestion.       SYNTHROID 50 MCG tablet Take 50 mcg by mouth daily.       zonisamide (ZONEGRAN) 100 MG capsule Take 400 mg by mouth in the morning.       cephALEXin (KEFLEX) 500 MG capsule Take 1 capsule (500 mg total) by mouth 4 (four) times daily. (Patient not taking: Reported on 04/04/2023) 28 capsule 0   metroNIDAZOLE (METROGEL) 1 % gel Apply 1 application topically daily as needed (Rosacea). Apply to face (Patient not taking: Reported on 04/04/2023)       nitrofurantoin, macrocrystal-monohydrate, (MACROBID) 100 MG capsule Take 1 capsule (100 mg total) by mouth 2 (two) times daily. (Patient not taking: Reported on 04/04/2023) 14 capsule 0   nystatin (MYCOSTATIN) 100000 UNIT/ML suspension Take by mouth. (Patient not taking: Reported on 04/04/2023)          No current facility-administered medications for this visit.        Allergies: Allergies      Allergies  Allergen Reactions   Sulfa Antibiotics Hives and Rash            Past Medical  History:  Diagnosis Date   Arthritis     History of colon polyps      removed with colonoscopy   History of kidney stones     Kidney stone      in past-has one at present, not bothersome   Seizures (HCC)      none in 10 yrs- controlled with meds(Dr. Nicholes Stairs [phone])             Past Surgical History:  Procedure Laterality Date   CHOLECYSTECTOMY   07/25/2012    Procedure: LAPAROSCOPIC CHOLECYSTECTOMY WITH INTRAOPERATIVE CHOLANGIOGRAM;  Surgeon: Axel Filler, MD;  Location: [facility];  Service: General;;   COLONOSCOPY   11/15/2011    Procedure: COLONOSCOPY;  Surgeon: Malissa Hippo, MD;  Location: [facility];  Service: Endoscopy;  Laterality: N/A;  730   COLONOSCOPY WITH PROPOFOL N/A 07/11/2022    Procedure: COLONOSCOPY WITH PROPOFOL;   Surgeon: Dolores Frame, MD;  Location: [facility];  Service: Gastroenterology;  Laterality: N/A;  730 ASA 3   ELBOW SURGERY        chipped bone right elbow   POLYPECTOMY   07/11/2022    Procedure: POLYPECTOMY INTESTINAL;  Surgeon: Dolores Frame, MD;  Location: [facility];  Service: Gastroenterology;;   TONSILLECTOMY       VASECTOMY            Family History  Family history unknown: Yes        Social History         Socioeconomic History   Marital status: Married      Spouse name: Not on file   Number of children: Not on file   Years of education: Not on file   Highest education level: Not on file  Occupational History   Not on file  Tobacco Use   Smoking status: Former      Current packs/day: 0.00      Types: Cigarettes      Quit date: 07/19/1982      Years since quitting: 40.7   Smokeless tobacco: Never  Substance and Sexual Activity   Alcohol use: Yes      Alcohol/week: 1.0 standard drink of alcohol      Types: 1 Glasses of wine per week      Comment: mixed drink  once in awhile   Drug use: No   Sexual activity: Yes  Other Topics Concern   Not on file  Social History Narrative   Not on file    Social Determinants of Health    Financial Resource Strain: Not on file  Food Insecurity: Not on file  Transportation Needs: Not on file  Physical Activity: Not on file  Stress: Not on file  Social Connections: Not on file  Intimate Partner Violence: Not on file      Review of Systems Constitutional: Patient denies any unintentional weight loss or change in strength lntegumentary: Patient denies any rashes or pruritus Cardiovascular: Patient denies chest pain or syncope Respiratory: Patient denies shortness of breath Gastrointestinal: Patient denies nausea, vomiting, constipation, or diarrhea Musculoskeletal: Patient denies muscle cramps or weakness Neurologic: Patient denies convulsions or seizures Psychiatric: Patient denies  memory problems Allergic/Immunologic: Patient denies recent allergic reaction(s) Endocrine: Patient denies heat/cold intolerance   GU: As per HPI.   OBJECTIVE    Vitals:    04/04/23 1132  BP: (!) 163/83  Pulse: (!) 56  Temp: (!) 97.5 F (36.4 C)    There is no height or  weight on file to calculate BMI.   Physical Examination Constitutional: No obvious distress; patient is non-toxic appearing  Cardiovascular: No visible lower extremity edema.  Respiratory: The patient does not have audible wheezing/stridor; respirations do not appear labored  Gastrointestinal: Abdomen non-distended Musculoskeletal: Normal ROM of UEs  Skin: No obvious rashes/open sores  Neurologic: CN 2-12 grossly intact Psychiatric: Answered questions appropriately with normal affect  Hematologic/Lymphatic/Immunologic: No obvious bruises or sites of spontaneous bleeding   ASSESSMENT Gross hematuria - Plan: Bladder Voiding Trial, ciprofloxacin (CIPRO) tablet 500 mg, CT RENAL STONE STUDY   Benign prostatic hyperplasia with nocturia - Plan: Bladder Voiding Trial, ciprofloxacin (CIPRO) tablet 500 mg, CT RENAL STONE STUDY   Nephrolithiasis - Plan: Bladder Voiding Trial, ciprofloxacin (CIPRO) tablet 500 mg, CT RENAL STONE STUDY   Bladder calculi - Plan: Bladder Voiding Trial, ciprofloxacin (CIPRO) tablet 500 mg, CT RENAL STONE STUDY   Benign prostatic hyperplasia with urinary obstruction - Plan: Bladder Voiding Trial, ciprofloxacin (CIPRO) tablet 500 mg, CT RENAL STONE STUDY   Voiding trial was done and pt was able to successfully empty bladder of the contents instilled. Foley catheter to remain out. Discussion was had about increasing water intake for the next few day to insure good voiding.   Consulted with Dr. Ronne Binning. Advised CT for stone evaluation and cystoscopy to reassess prostate as patient expressed interest in interest in proceeding with bladder outlet procedure to address his BPH.   Will plan for  follow up as previously scheduled on 06/15/2023 with Dr. Ronne Binning or sooner if needed. Pt verbalized understanding and agreement. All questions were answered.   PLAN We discussed the management of his BPH including continued medical therapy, Rezum, Urolift, TURP and simple prostatectomy. After discussing the options the patient has elected to proceed with TURP. Risks/benefits/alternatives discussed.

## 2023-08-09 NOTE — Progress Notes (Signed)
Patient doing well.  Sitting up eating waiting on bed placement.

## 2023-08-09 NOTE — Progress Notes (Signed)
Patient moved to minor room.  Waiting on bed.  Patient doing excellent.  Wife at bedside

## 2023-08-10 ENCOUNTER — Encounter (HOSPITAL_COMMUNITY): Payer: Self-pay | Admitting: Urology

## 2023-08-10 DIAGNOSIS — Z79899 Other long term (current) drug therapy: Secondary | ICD-10-CM | POA: Diagnosis not present

## 2023-08-10 DIAGNOSIS — E039 Hypothyroidism, unspecified: Secondary | ICD-10-CM | POA: Diagnosis not present

## 2023-08-10 DIAGNOSIS — N210 Calculus in bladder: Secondary | ICD-10-CM | POA: Diagnosis not present

## 2023-08-10 DIAGNOSIS — Z87891 Personal history of nicotine dependence: Secondary | ICD-10-CM | POA: Diagnosis not present

## 2023-08-10 DIAGNOSIS — N401 Enlarged prostate with lower urinary tract symptoms: Secondary | ICD-10-CM | POA: Diagnosis not present

## 2023-08-10 DIAGNOSIS — Z7982 Long term (current) use of aspirin: Secondary | ICD-10-CM | POA: Diagnosis not present

## 2023-08-10 LAB — CBC
HCT: 36.1 % — ABNORMAL LOW (ref 39.0–52.0)
Hemoglobin: 12 g/dL — ABNORMAL LOW (ref 13.0–17.0)
MCH: 30.7 pg (ref 26.0–34.0)
MCHC: 33.2 g/dL (ref 30.0–36.0)
MCV: 92.3 fL (ref 80.0–100.0)
Platelets: 176 10*3/uL (ref 150–400)
RBC: 3.91 MIL/uL — ABNORMAL LOW (ref 4.22–5.81)
RDW: 12.5 % (ref 11.5–15.5)
WBC: 9.4 10*3/uL (ref 4.0–10.5)
nRBC: 0 % (ref 0.0–0.2)

## 2023-08-10 LAB — BASIC METABOLIC PANEL
Anion gap: 6 (ref 5–15)
BUN: 15 mg/dL (ref 8–23)
CO2: 23 mmol/L (ref 22–32)
Calcium: 9.6 mg/dL (ref 8.9–10.3)
Chloride: 105 mmol/L (ref 98–111)
Creatinine, Ser: 1.02 mg/dL (ref 0.61–1.24)
GFR, Estimated: >60 mL/min (ref >60)
Glucose, Bld: 99 mg/dL (ref 70–99)
Potassium: 3.7 mmol/L (ref 3.5–5.1)
Sodium: 134 mmol/L — ABNORMAL LOW (ref 135–145)

## 2023-08-10 LAB — SURGICAL PATHOLOGY

## 2023-08-10 MED ORDER — TRAMADOL HCL 50 MG PO TABS: 50.0000 mg | ORAL_TABLET | Freq: Four times a day (QID) | ORAL | 0 refills | Status: DC | PRN

## 2023-08-10 NOTE — Progress Notes (Signed)
Patient has rested well.  Vitals stable. Urine has been irrigated at a slow rate, pink in color. No complaints of pain overnight.

## 2023-08-10 NOTE — Care Management Obs Status (Signed)
MEDICARE OBSERVATION STATUS NOTIFICATION   Patient Details  Name: [name] MRN: [id number] Date of Birth: 1941-02-08   Medicare Observation Status Notification Given:  Yes Pearletha Furl., CMA, verbally reviewed observtion notice with Beola Cord. Copy provided.)    Corey Harold 08/10/2023, 10:38 AM

## 2023-08-10 NOTE — Progress Notes (Unsigned)
Name: [name] DOB: Dec 15, 1940 MRN: [id number]  Diagnoses: Post-operative state  HPI: [name] presents post-operatively.  He is accompanied by his wife [name].  Today He presents s/p the following procedures by Dr. Ronne Binning on 08/09/2023:  Preoperative diagnosis: BPH   Postoperative diagnosis: BPH, bladder calculi   Procedure:  1. cystoscopy 2. Transurethral resection of the prostate 3. Cystolithalopaxy for a stone greater than 2.5cm  Pathology:  - A. PROSTATE CHIPS, TURP: Benign prostatic hyperplasia, stromal and glandular types.  - Stone analysis showed 20% calcium oxalate and 80% hydroxyapatite composition.  Postop course: Today He reports doing well.  He reports the catheter is draining well.  He denies gross hematuria.  He denies acute flank pain, abdominal pain, fevers, nausea, or vomiting.   Fall Screening: Do you usually have a device to assist in your mobility? No   Medications: Current Outpatient Medications  Medication Sig Dispense Refill   finasteride (PROSCAR) 5 MG tablet Take 1 tablet (5 mg total) by mouth daily. (Patient taking differently: Take 5 mg by mouth daily in the afternoon.) 90 tablet 3   levothyroxine (SYNTHROID) 75 MCG tablet Take 75 mcg by mouth daily before breakfast.     Multiple Vitamin (MULTIVITAMIN WITH MINERALS) TABS tablet Take 1 tablet by mouth daily at 12 noon.     naproxen sodium (ALEVE) 220 MG tablet Take 220 mg by mouth in the morning and at bedtime.     Oxcarbazepine (TRILEPTAL) 300 MG tablet Take 300 mg by mouth 2 (two) times daily.     sodium chloride (OCEAN) 0.65 % nasal spray Place 1 spray into the nose daily as needed for congestion.     traMADol (ULTRAM) 50 MG tablet Take 1 tablet (50 mg total) by mouth every 6 (six) hours as needed. 15 tablet 0   zonisamide (ZONEGRAN) 100 MG capsule Take 400 mg by mouth in the morning.     No current facility-administered medications for this visit.    Allergies: Allergies   Allergen Reactions   Sulfa Antibiotics Hives and Rash    Past Medical History:  Diagnosis Date   Arthritis    History of colon polyps    removed with colonoscopy   History of kidney stones    Hypothyroidism    Seizures (HCC)    none in 10 yrs- controlled with meds(Dr. Nicholes Stairs [phone])   Past Surgical History:  Procedure Laterality Date   CHOLECYSTECTOMY  07/25/2012   Procedure: LAPAROSCOPIC CHOLECYSTECTOMY WITH INTRAOPERATIVE CHOLANGIOGRAM;  Surgeon: Axel Filler, MD;  Location: [facility];  Service: General;;   COLONOSCOPY  11/15/2011   Procedure: COLONOSCOPY;  Surgeon: Malissa Hippo, MD;  [facility];  Service: Endoscopy;  Laterality: N/A;  730   COLONOSCOPY WITH PROPOFOL N/A 07/11/2022   Procedure: COLONOSCOPY WITH PROPOFOL;  Surgeon: Dolores Frame, MD;  [facility];  Service: Gastroenterology;  Laterality: N/A;  730 ASA 3   CYSTOSCOPY WITH LITHOLAPAXY  08/09/2023   Procedure: CYSTOSCOPY WITH LITHOLAPAXY;  Surgeon: Malen Gauze, MD;  Location: [facility];  Service: Urology;;   ELBOW SURGERY     chipped bone right elbow   HOLMIUM LASER APPLICATION  08/09/2023   Procedure: HOLMIUM LASER APPLICATION;  Surgeon: Malen Gauze, MD;  Location: [facility];  Service: Urology;;   POLYPECTOMY  07/11/2022   Procedure: POLYPECTOMY INTESTINAL;  Surgeon: Dolores Frame, MD;  [facility];  Service: Gastroenterology;;   TONSILLECTOMY     TRANSURETHRAL RESECTION OF  PROSTATE N/A 08/09/2023   Procedure: TRANSURETHRAL RESECTION OF THE PROSTATE (TURP);  Surgeon: Malen Gauze, MD;  Location: [facility];  Service: Urology;  Laterality: N/A;   VASECTOMY     Family History  Family history unknown: Yes   Social History   Socioeconomic History   Marital status: Married    Spouse name: Not on file   Number of children: Not on file   Years of education: Not on file   Highest education level: Not on file  Occupational History    Not on file  Tobacco Use   Smoking status: Former    Current packs/day: 0.00    Types: Cigarettes    Quit date: 07/19/1982    Years since quitting: 41.1   Smokeless tobacco: Never  Substance and Sexual Activity   Alcohol use: Yes    Alcohol/week: 1.0 standard drink of alcohol    Types: 1 Glasses of wine per week    Comment: mixed drink  once in awhile   Drug use: No   Sexual activity: Yes  Other Topics Concern   Not on file  Social History Narrative   Not on file   Social Drivers of Health   Financial Resource Strain: Not on file  Food Insecurity: No Food Insecurity (08/09/2023)   Hunger Vital Sign    Worried About Running Out of Food in the Last Year: Never true    Ran Out of Food in the Last Year: Never true  Transportation Needs: No Transportation Needs (08/09/2023)   PRAPARE - Administrator, Civil Service (Medical): No    Lack of Transportation (Non-Medical): No  Physical Activity: Not on file  Stress: Not on file  Social Connections: Socially Integrated (08/09/2023)   Social Connection and Isolation Panel [NHANES]    Frequency of Communication with Friends and Family: More than three times a week    Frequency of Social Gatherings with Friends and Family: Twice a week    Attends Religious Services: More than 4 times per year    Active Member of [employer] or Organizations: Yes    Attends Engineer, structural: More than 4 times per year    Marital Status: Married  Catering manager Violence: Not At Risk (08/09/2023)   Humiliation, Afraid, Rape, and Kick questionnaire    Fear of Current or Ex-Partner: No    Emotionally Abused: No    Physically Abused: No    Sexually Abused: No    SUBJECTIVE  Review of Systems Constitutional: Patient denies any unintentional weight loss or change in strength lntegumentary: Patient denies any rashes or pruritus Cardiovascular: Patient denies chest pain or syncope Respiratory: Patient denies shortness of  breath Gastrointestinal: Patient denies nausea, vomiting, constipation, or diarrhea Musculoskeletal: Patient denies muscle cramps or weakness Neurologic: Patient denies convulsions or seizures Allergic/Immunologic: Patient denies recent allergic reaction(s) Hematologic/Lymphatic: Patient denies bleeding tendencies Endocrine: Patient denies heat/cold intolerance  GU: As per HPI.  OBJECTIVE Vitals:   08/15/23 0905  BP: (!) 166/84  Pulse: (!) 55  Temp: 97.6 F (36.4 C)   There is no height or weight on file to calculate BMI.  Physical Examination Constitutional: No obvious distress; patient is non-toxic appearing  Cardiovascular: No visible lower extremity edema.  Respiratory: The patient does not have audible wheezing/stridor; respirations do not appear labored  Gastrointestinal: Abdomen non-distended Musculoskeletal: Normal ROM of UEs  Skin: No obvious rashes/open sores  Neurologic: CN 2-12 grossly intact Psychiatric: Answered questions appropriately with normal affect  Hematologic/Lymphatic/Immunologic:  No obvious bruises or sites of spontaneous bleeding   ASSESSMENT Benign prostatic hyperplasia with urinary obstruction  Postop check  Bladder stone  We reviewed the operative procedures and findings. Passed voiding trial. We agreed to plan for follow up in 3 months or sooner if needed. Patient verbalized understanding of and agreement with current plan. All questions were answered.  PLAN Advised the following: Foley catheter discontinued. Return in about 3 months (around 11/13/2023) for UA, PVR, & f/u with Evette Georges NP.  No orders of the defined types were placed in this encounter.   It has been explained that the patient is to follow regularly with their PCP in addition to all other providers involved in their care and to follow instructions provided by these respective offices. Patient advised to contact urology clinic if any urologic-pertaining questions,  concerns, new symptoms or problems arise in the interim period.  There are no Patient Instructions on file for this visit.  Electronically signed by:  Donnita Falls, MSN, FNP-C, CUNP 08/15/2023 9:34 AM

## 2023-08-10 NOTE — Progress Notes (Signed)
Patient refused hospital medications this morning. Patient states " I already took them. I brought my own to the hospital. I let told the other nurse." No hospital medications given at this time.

## 2023-08-14 LAB — CALCULI, WITH PHOTOGRAPH (CLINICAL LAB)
Calcium Oxalate Monohydrate: 20 %
Hydroxyapatite: 80 %
Weight Calculi: 2627 mg

## 2023-08-14 NOTE — Discharge Summary (Signed)
Physician Discharge Summary  Patient ID: [name] MRN: [id number] DOB/AGE: 11-16-1940 83 y.o.  Admit date: 08/09/2023 Discharge date: 08/10/2023  Admission Diagnoses:  BPH (benign prostatic hyperplasia)  Discharge Diagnoses:  Principal Problem:   BPH (benign prostatic hyperplasia) Active Problems:   Calculus of bladder   Past Medical History:  Diagnosis Date   Arthritis    History of colon polyps    removed with colonoscopy   History of kidney stones    Hypothyroidism    Seizures (HCC)    none in 10 yrs- controlled with meds(Dr. Nicholes Stairs [phone])    Surgeries: Procedure(s): TRANSURETHRAL RESECTION OF THE PROSTATE (TURP) CYSTOSCOPY WITH LITHOLAPAXY HOLMIUM LASER APPLICATION on 08/09/2023   Consultants (if any):   Discharged Condition: Improved  Hospital Course: [name] is an 83 y.o. male who was admitted 08/09/2023 with a diagnosis of BPH (benign prostatic hyperplasia) and went to the operating room on 08/09/2023 and underwent the above named procedures.    He was given perioperative antibiotics:  Anti-infectives (From admission, onward)    Start     Dose/Rate Route Frequency Ordered Stop   08/09/23 0614  cefTRIAXone (ROCEPHIN) 2 g in sodium chloride 0.9 % 100 mL IVPB        2 g 200 mL/hr over 30 Minutes Intravenous 30 min pre-op 08/09/23 0614 08/09/23 0745     .  He was given sequential compression devices, early ambulation for DVT prophylaxis.  He benefited maximally from the hospital stay and there were no complications.    Recent vital signs:  Vitals:   08/09/23 2100 08/10/23 0500  BP: (!) 140/83 125/78  Pulse: 60 (!) 59  Resp: 18 18  Temp: 98.2 F (36.8 C) 98.1 F (36.7 C)  SpO2: 96% 95%    Recent laboratory studies:  Lab Results  Component Value Date   HGB 12.0 (L) 08/10/2023   HGB 12.4 (L) 08/09/2023   HGB 13.0 08/07/2023   Lab Results  Component Value Date   WBC 9.4 08/10/2023   PLT 176 08/10/2023   Lab Results   Component Value Date   INR 1.1 06/13/2021   Lab Results  Component Value Date   NA 134 (L) 08/10/2023   K 3.7 08/10/2023   CL 105 08/10/2023   CO2 23 08/10/2023   BUN 15 08/10/2023   CREATININE 1.02 08/10/2023   GLUCOSE 99 08/10/2023    Discharge Medications:   Allergies as of 08/10/2023       Reactions   Sulfa Antibiotics Hives, Rash        Medication List     STOP taking these medications    alfuzosin 10 MG 24 hr tablet Commonly known as: UROXATRAL       TAKE these medications    finasteride 5 MG tablet Commonly known as: PROSCAR Take 1 tablet (5 mg total) by mouth daily. What changed: when to take this   levothyroxine 75 MCG tablet Commonly known as: SYNTHROID Take 75 mcg by mouth daily before breakfast.   multivitamin with minerals Tabs tablet Take 1 tablet by mouth daily at 12 noon.   naproxen sodium 220 MG tablet Commonly known as: ALEVE Take 220 mg by mouth in the morning and at bedtime.   Oxcarbazepine 300 MG tablet Commonly known as: TRILEPTAL Take 300 mg by mouth 2 (two) times daily.   sodium chloride 0.65 % nasal spray Commonly known as: OCEAN Place 1 spray into the nose daily as needed for congestion.   traMADol 50  MG tablet Commonly known as: Ultram Take 1 tablet (50 mg total) by mouth every 6 (six) hours as needed.   zonisamide 100 MG capsule Commonly known as: ZONEGRAN Take 400 mg by mouth in the morning.        Diagnostic Studies: No results found.  Disposition: Discharge disposition: 01-Home or Self Care       Discharge Instructions     Discharge patient   Complete by: As directed    Discharge disposition: 01-Home or Self Care   Discharge patient date: 08/10/2023        Follow-up Information     Jeremaih Klima, Mardene Celeste, MD. Call in 1 week(s).   Specialty: Urology Contact information: [address]  [place] [address] [phone]                  Signed: Wilkie Aye 08/14/2023, 7:57 AM

## 2023-08-15 ENCOUNTER — Encounter: Payer: Self-pay | Admitting: Urology

## 2023-08-15 ENCOUNTER — Ambulatory Visit (INDEPENDENT_AMBULATORY_CARE_PROVIDER_SITE_OTHER): Payer: Medicare Other | Admitting: Urology

## 2023-08-15 VITALS — BP 166/84 | HR 55 | Temp 97.6°F

## 2023-08-15 DIAGNOSIS — Z85828 Personal history of other malignant neoplasm of skin: Secondary | ICD-10-CM | POA: Diagnosis not present

## 2023-08-15 DIAGNOSIS — Z09 Encounter for follow-up examination after completed treatment for conditions other than malignant neoplasm: Secondary | ICD-10-CM

## 2023-08-15 DIAGNOSIS — Z87442 Personal history of urinary calculi: Secondary | ICD-10-CM

## 2023-08-15 DIAGNOSIS — N401 Enlarged prostate with lower urinary tract symptoms: Secondary | ICD-10-CM

## 2023-08-15 DIAGNOSIS — L570 Actinic keratosis: Secondary | ICD-10-CM | POA: Diagnosis not present

## 2023-08-15 DIAGNOSIS — N210 Calculus in bladder: Secondary | ICD-10-CM

## 2023-08-15 DIAGNOSIS — N138 Other obstructive and reflux uropathy: Secondary | ICD-10-CM

## 2023-08-15 DIAGNOSIS — L821 Other seborrheic keratosis: Secondary | ICD-10-CM | POA: Diagnosis not present

## 2023-08-15 NOTE — Progress Notes (Signed)
Fill and Pull Catheter Removal  Patient is present today for a catheter removal.  Patient was cleaned and prepped in a sterile fashion of sterile water/ saline was instilled into the bladder when the patient felt the urge to urinate. 30ml of water was then drained from the balloon.  A 22FR foley cath was removed from the bladder no complications were noted .  Patient as then given some time to void on their own.  Patient can void  on their own after some time.  Patient tolerated well. PVR done right after PVR =35  Performed by: Gwendolyn Grant CMA  Follow up/ Additional notes: As scheduled

## 2023-08-28 DIAGNOSIS — H40013 Open angle with borderline findings, low risk, bilateral: Secondary | ICD-10-CM | POA: Diagnosis not present

## 2023-08-28 DIAGNOSIS — H04123 Dry eye syndrome of bilateral lacrimal glands: Secondary | ICD-10-CM | POA: Diagnosis not present

## 2023-08-30 ENCOUNTER — Telehealth: Payer: Self-pay

## 2023-08-30 ENCOUNTER — Other Ambulatory Visit: Payer: Medicare Other

## 2023-08-30 DIAGNOSIS — R310 Gross hematuria: Secondary | ICD-10-CM

## 2023-08-30 LAB — URINALYSIS, ROUTINE W REFLEX MICROSCOPIC
Bilirubin, UA: NEGATIVE
Glucose, UA: NEGATIVE
Ketones, UA: NEGATIVE
Nitrite, UA: NEGATIVE
Specific Gravity, UA: 1.01 (ref 1.005–1.030)
Urobilinogen, Ur: 0.2 mg/dL (ref 0.2–1.0)
pH, UA: 7 (ref 5.0–7.5)

## 2023-08-30 LAB — MICROSCOPIC EXAMINATION
RBC, Urine: >30 /[HPF] — AB (ref 0–2)
WBC, UA: >30 /[HPF] — AB (ref 0–5)

## 2023-08-30 NOTE — Telephone Encounter (Signed)
 Patient states that he has been having hematuria on and off for the past week. Patient voiced no other symptoms. Patient placed on lab schedule for urine drop off.

## 2023-08-30 NOTE — Progress Notes (Signed)
 Patient present in office for urine drop off due to hematuria.  Urine sent for culture.  Dr. Claretta Croft made aware.

## 2023-08-31 ENCOUNTER — Telehealth: Payer: Self-pay

## 2023-08-31 DIAGNOSIS — R310 Gross hematuria: Secondary | ICD-10-CM

## 2023-08-31 MED ORDER — DOXYCYCLINE HYCLATE 100 MG PO CAPS: 100.0000 mg | ORAL_CAPSULE | Freq: Two times a day (BID) | ORAL | 0 refills | Status: DC

## 2023-08-31 NOTE — Telephone Encounter (Signed)
 Patient called and informed doxycyline 100 mg po bid x 7 days was sent to pharmacy per Dr. Claretta Croft.

## 2023-09-01 LAB — URINE CULTURE

## 2023-10-04 DIAGNOSIS — Z13220 Encounter for screening for lipoid disorders: Secondary | ICD-10-CM | POA: Diagnosis not present

## 2023-10-04 DIAGNOSIS — R739 Hyperglycemia, unspecified: Secondary | ICD-10-CM | POA: Diagnosis not present

## 2023-10-04 DIAGNOSIS — G40309 Generalized idiopathic epilepsy and epileptic syndromes, not intractable, without status epilepticus: Secondary | ICD-10-CM | POA: Diagnosis not present

## 2023-10-09 DIAGNOSIS — Z5181 Encounter for therapeutic drug level monitoring: Secondary | ICD-10-CM | POA: Diagnosis not present

## 2023-10-09 DIAGNOSIS — G40109 Localization-related (focal) (partial) symptomatic epilepsy and epileptic syndromes with simple partial seizures, not intractable, without status epilepticus: Secondary | ICD-10-CM | POA: Diagnosis not present

## 2023-10-10 DIAGNOSIS — G40309 Generalized idiopathic epilepsy and epileptic syndromes, not intractable, without status epilepticus: Secondary | ICD-10-CM | POA: Diagnosis not present

## 2023-10-10 DIAGNOSIS — Z8249 Family history of ischemic heart disease and other diseases of the circulatory system: Secondary | ICD-10-CM | POA: Diagnosis not present

## 2023-10-10 DIAGNOSIS — L719 Rosacea, unspecified: Secondary | ICD-10-CM | POA: Diagnosis not present

## 2023-10-10 DIAGNOSIS — M1712 Unilateral primary osteoarthritis, left knee: Secondary | ICD-10-CM | POA: Diagnosis not present

## 2023-10-10 DIAGNOSIS — Z6826 Body mass index (BMI) 26.0-26.9, adult: Secondary | ICD-10-CM | POA: Diagnosis not present

## 2023-10-10 DIAGNOSIS — Z23 Encounter for immunization: Secondary | ICD-10-CM | POA: Diagnosis not present

## 2023-11-13 ENCOUNTER — Ambulatory Visit (INDEPENDENT_AMBULATORY_CARE_PROVIDER_SITE_OTHER): Payer: Medicare Other | Admitting: Urology

## 2023-11-13 ENCOUNTER — Encounter: Payer: Self-pay | Admitting: Urology

## 2023-11-13 VITALS — BP 168/91 | HR 59

## 2023-11-13 DIAGNOSIS — N402 Nodular prostate without lower urinary tract symptoms: Secondary | ICD-10-CM | POA: Diagnosis not present

## 2023-11-13 DIAGNOSIS — N401 Enlarged prostate with lower urinary tract symptoms: Secondary | ICD-10-CM

## 2023-11-13 DIAGNOSIS — N200 Calculus of kidney: Secondary | ICD-10-CM

## 2023-11-13 DIAGNOSIS — N138 Other obstructive and reflux uropathy: Secondary | ICD-10-CM

## 2023-11-13 DIAGNOSIS — R8290 Unspecified abnormal findings in urine: Secondary | ICD-10-CM | POA: Diagnosis not present

## 2023-11-13 DIAGNOSIS — N210 Calculus in bladder: Secondary | ICD-10-CM

## 2023-11-13 LAB — URINALYSIS, ROUTINE W REFLEX MICROSCOPIC
Bilirubin, UA: NEGATIVE
Glucose, UA: NEGATIVE
Ketones, UA: NEGATIVE
Nitrite, UA: POSITIVE — AB
Specific Gravity, UA: 1.02 (ref 1.005–1.030)
Urobilinogen, Ur: 0.2 mg/dL (ref 0.2–1.0)
pH, UA: 7 (ref 5.0–7.5)

## 2023-11-13 LAB — MICROSCOPIC EXAMINATION
RBC, Urine: >30 /HPF — AB (ref 0–2)
WBC, UA: >30 /HPF — AB (ref 0–5)

## 2023-11-13 LAB — BLADDER SCAN AMB NON-IMAGING: Scan Result: 95

## 2023-11-13 NOTE — Progress Notes (Signed)
 Name: [name] DOB: [birth year] MRN: [id number]  History of Present Illness: [name] is a 83 y.o. male who presents today for follow up visit at [facility] [place].  GU History includes: 1. BPH with LUTS. - 04/18/2023: Per CT "Marked prostatic calcifications are noted. Prostate is prominent." - No prior PSA values available per chart review.  - Previously took Alfuzosin .  - Currently taking Proscar  5 mg daily.  2. Kidney & bladder stones. 3. Bilateral renal cysts.  Recent history: > 08/09/2023: Underwent TURP and cystolithalopaxy by Dr. Claretta Croft. Stone analysis showed 20% calcium oxalate and 80% hydroxyapatite composition.  > 08/15/2023: Postop visit. Doing well.  Today: He reports urinary urgency, frequency, nocturia x2-3. He states his LUTS are not significantly bothersome. Denies dysuria, gross hematuria, weak urinary stream, hesitancy, straining to void, or sensations of incomplete emptying.   He denies recent episode of stone passage. He denies flank pain or abdominal pain.   Medications: Current Outpatient Medications  Medication Sig Dispense Refill   finasteride  (PROSCAR ) 5 MG tablet Take 1 tablet (5 mg total) by mouth daily. (Patient taking differently: Take 5 mg by mouth daily in the afternoon.) 90 tablet 3   doxycycline  (VIBRAMYCIN ) 100 MG capsule Take 1 capsule (100 mg total) by mouth every 12 (twelve) hours. 14 capsule 0   levothyroxine  (SYNTHROID ) 75 MCG tablet Take 75 mcg by mouth daily before breakfast.     Multiple Vitamin (MULTIVITAMIN WITH MINERALS) TABS tablet Take 1 tablet by mouth daily at 12 noon.     naproxen  sodium (ALEVE ) 220 MG tablet Take 220 mg by mouth in the morning and at bedtime.     Oxcarbazepine  (TRILEPTAL ) 300 MG tablet Take 300 mg by mouth 2 (two) times daily.     sodium chloride  (OCEAN) 0.65 % nasal spray Place 1 spray into the nose daily as needed for congestion.     traMADol  (ULTRAM ) 50 MG tablet Take 1 tablet (50 mg  total) by mouth every 6 (six) hours as needed. 15 tablet 0   zonisamide  (ZONEGRAN ) 100 MG capsule Take 400 mg by mouth in the morning.     No current facility-administered medications for this visit.    Allergies: Allergies  Allergen Reactions   Sulfa  Antibiotics Hives and Rash    Past Medical History:  Diagnosis Date   Arthritis    History of colon polyps    removed with colonoscopy   History of kidney stones    Hypothyroidism    Seizures (HCC)    none in 10 yrs- controlled with meds(Dr. Pinkie Brigham [phone])   Past Surgical History:  Procedure Laterality Date   CHOLECYSTECTOMY  07/25/2012   Procedure: LAPAROSCOPIC CHOLECYSTECTOMY WITH INTRAOPERATIVE CHOLANGIOGRAM;  Surgeon: Shela Derby, MD;  [facility]: [facility];  Service: General;;   COLONOSCOPY  11/15/2011   Procedure: COLONOSCOPY;  Surgeon: Ruby Corporal, MD;  [facility];  Service: Endoscopy;  Laterality: N/A;  730   COLONOSCOPY WITH PROPOFOL  N/A 07/11/2022   Procedure: COLONOSCOPY WITH PROPOFOL ;  Surgeon: Urban Garden, MD;  [facility];  Service: Gastroenterology;  Laterality: N/A;  730 ASA 3   CYSTOSCOPY WITH LITHOLAPAXY  08/09/2023   Procedure: CYSTOSCOPY WITH LITHOLAPAXY;  Surgeon: Marco Severs, MD;  Location: [facility];  Service: Urology;;   ELBOW SURGERY     chipped bone right elbow   HOLMIUM LASER APPLICATION  08/09/2023   Procedure: HOLMIUM LASER APPLICATION;  Surgeon: Marco Severs, MD;  Location: [facility];  Service: Urology;;   POLYPECTOMY  07/11/2022   Procedure: POLYPECTOMY INTESTINAL;  Surgeon: Urban Garden, MD;  [facility];  Service: Gastroenterology;;   TONSILLECTOMY     TRANSURETHRAL RESECTION OF PROSTATE N/A 08/09/2023   Procedure: TRANSURETHRAL RESECTION OF THE PROSTATE (TURP);  Surgeon: Marco Severs, MD;  Location: [facility];  Service: Urology;  Laterality: N/A;   VASECTOMY     Family History  Family history unknown: Yes    Social History   Socioeconomic History   Marital status: Married    Spouse name: Not on file   Number of children: Not on file   Years of education: Not on file   Highest education level: Not on file  Occupational History   Not on file  Tobacco Use   Smoking status: Former    Current packs/day: 0.00    Types: Cigarettes    Quit date: 07/19/1982    Years since quitting: 41.3   Smokeless tobacco: Never  Substance and Sexual Activity   Alcohol  use: Yes    Alcohol /week: 1.0 standard drink of alcohol     Types: 1 Glasses of wine per week    Comment: mixed drink  once in awhile   Drug use: No   Sexual activity: Yes  Other Topics Concern   Not on file  Social History Narrative   Not on file   Social Drivers of Health   Financial Resource Strain: Not on file  Food Insecurity: No Food Insecurity (08/09/2023)   Hunger Vital Sign    Worried About Running Out of Food in the Last Year: Never true    Ran Out of Food in the Last Year: Never true  Transportation Needs: No Transportation Needs (08/09/2023)   PRAPARE - Administrator, Civil Service (Medical): No    Lack of Transportation (Non-Medical): No  Physical Activity: Not on file  Stress: Not on file  Social Connections: Socially Integrated (08/09/2023)   Social Connection and Isolation Panel [NHANES]    Frequency of Communication with Friends and Family: More than three times a week    Frequency of Social Gatherings with Friends and Family: Twice a week    Attends Religious Services: More than 4 times per year    Active Member of [employer] or Organizations: Yes    Attends Engineer, structural: More than 4 times per year    Marital Status: Married  Catering manager Violence: Not At Risk (08/09/2023)   Humiliation, Afraid, Rape, and Kick questionnaire    Fear of Current or Ex-Partner: No    Emotionally Abused: No    Physically Abused: No    Sexually Abused: No    Review of Systems Constitutional: Patient  denies any unintentional weight loss or change in strength lntegumentary: Patient denies any rashes or pruritus Cardiovascular: Patient denies chest pain or syncope Respiratory: Patient denies shortness of breath Gastrointestinal: Patient denies nausea or vomiting Musculoskeletal: Patient denies muscle cramps or weakness Neurologic: Patient denies convulsions or seizures Allergic/Immunologic: Patient denies recent allergic reaction(s) Hematologic/Lymphatic: Patient denies bleeding tendencies Endocrine: Patient denies heat/cold intolerance  GU: As per HPI.  OBJECTIVE Vitals:   11/13/23 1129  BP: (!) 168/91  Pulse: (!) 59   There is no height or weight on file to calculate BMI.  Physical Examination Constitutional: No obvious distress; patient is non-toxic appearing  Cardiovascular: No visible lower extremity edema.  Respiratory: The patient does not have audible wheezing/stridor; respirations do not appear labored  Gastrointestinal: Abdomen non-distended  Musculoskeletal: Normal ROM of UEs  Skin: No obvious rashes/open sores  Neurologic: CN 2-12 grossly intact Psychiatric: Answered questions appropriately with normal affect  Hematologic/Lymphatic/Immunologic: No obvious bruises or sites of spontaneous bleeding  Urine microscopy: >30 WBC/hpf, >30 RBC/hpf, many bacteria  ASSESSMENT Benign prostatic hyperplasia with urinary obstruction - Plan: Urinalysis, Routine w reflex microscopic, BLADDER SCAN AMB NON-IMAGING, Urine Culture, US  RENAL, DG Abd 1 View, PSA  Nephrolithiasis - Plan: Urine Culture, US  RENAL, DG Abd 1 View  Calculus of bladder - Plan: Urine Culture, US  RENAL, DG Abd 1 View  Prostate nodule - Plan: PSA  Abnormal urinalysis  He is feeling well today with no complaints, however we discussed provider concern regarding grossly abnormal UA / urine microscopy. Advised RUS & KUB to assess stone burden. Will also check urine culture. We agreed to plan for follow up in 4-6  weeks for recheck. Patient verbalized understanding of and agreement with current plan. All questions were answered.  PLAN Advised the following: 1. RUS & KUB. 2. Urine culture. 3. Return in about 4 weeks (around 12/11/2023) for UA, PVR, & f/u with Griselda Lederer NP.  Orders Placed This Encounter  Procedures   Urine Culture   US  RENAL    Standing Status:   Future    Expected Date:   11/13/2023    Expiration Date:   11/12/2024    Reason for Exam (SYMPTOM  OR DIAGNOSIS REQUIRED):   kidney stone known or suspected    Preferred imaging location?:   [facility]   DG Abd 1 View    Standing Status:   Future    Expected Date:   11/13/2023    Expiration Date:   11/12/2024    Reason for Exam (SYMPTOM  OR DIAGNOSIS REQUIRED):   kidney stone    Preferred imaging location?:   [facility]   Urinalysis, Routine w reflex microscopic   PSA   BLADDER SCAN AMB NON-IMAGING   Total time spent caring for the patient today was over 30 minutes. This includes time spent on the date of the visit reviewing the patient's chart before the visit, time spent during the visit, and time spent after the visit on documentation. Over 50% of that time was spent in face-to-face time with this patient for direct counseling. E&M based on time and complexity of medical decision making.  It has been explained that the patient is to follow regularly with their PCP in addition to all other providers involved in their care and to follow instructions provided by these respective offices. Patient advised to contact urology clinic if any urologic-pertaining questions, concerns, new symptoms or problems arise in the interim period.  There are no Patient Instructions on file for this visit.  Electronically signed by:  Lauretta Ponto, FNP   11/13/23    1:02 PM

## 2023-11-14 LAB — PSA: Prostate Specific Ag, Serum: <0.1 ng/mL (ref 0.0–4.0)

## 2023-11-15 LAB — URINE CULTURE

## 2023-11-22 ENCOUNTER — Ambulatory Visit (HOSPITAL_COMMUNITY)
Admission: RE | Admit: 2023-11-22 | Discharge: 2023-11-22 | Disposition: A | Discharge status: Home / Self Care | Source: Ambulatory Visit | Attending: Urology | Admitting: Urology

## 2023-11-22 DIAGNOSIS — N281 Cyst of kidney, acquired: Secondary | ICD-10-CM | POA: Diagnosis not present

## 2023-11-22 DIAGNOSIS — N132 Hydronephrosis with renal and ureteral calculous obstruction: Secondary | ICD-10-CM | POA: Diagnosis not present

## 2023-11-22 DIAGNOSIS — N200 Calculus of kidney: Secondary | ICD-10-CM | POA: Diagnosis not present

## 2023-11-22 DIAGNOSIS — Z87442 Personal history of urinary calculi: Secondary | ICD-10-CM | POA: Diagnosis not present

## 2023-11-22 DIAGNOSIS — N138 Other obstructive and reflux uropathy: Secondary | ICD-10-CM | POA: Insufficient documentation

## 2023-11-22 DIAGNOSIS — N401 Enlarged prostate with lower urinary tract symptoms: Secondary | ICD-10-CM | POA: Insufficient documentation

## 2023-11-22 DIAGNOSIS — N210 Calculus in bladder: Secondary | ICD-10-CM | POA: Diagnosis not present

## 2023-11-26 DIAGNOSIS — M25462 Effusion, left knee: Secondary | ICD-10-CM | POA: Diagnosis not present

## 2023-11-26 DIAGNOSIS — Z6825 Body mass index (BMI) 25.0-25.9, adult: Secondary | ICD-10-CM | POA: Diagnosis not present

## 2023-11-26 DIAGNOSIS — M1712 Unilateral primary osteoarthritis, left knee: Secondary | ICD-10-CM | POA: Diagnosis not present

## 2023-12-03 NOTE — Progress Notes (Signed)
 Please let patient know that his RUS & KUB showed an 11 mm left kidney stone with mild hydronephrosis. This is likely the cause for his abnormal UA at last visit. Please ask if he interested in a stone procedure - if so, I will consult with Dr. Claretta Croft. Thanks!

## 2023-12-06 ENCOUNTER — Ambulatory Visit: Admitting: Orthopedic Surgery

## 2023-12-06 ENCOUNTER — Other Ambulatory Visit (INDEPENDENT_AMBULATORY_CARE_PROVIDER_SITE_OTHER): Payer: Self-pay

## 2023-12-06 ENCOUNTER — Encounter: Payer: Self-pay | Admitting: Orthopedic Surgery

## 2023-12-06 VITALS — BP 138/94 | HR 59 | Ht 67.5 in | Wt 158.0 lb

## 2023-12-06 DIAGNOSIS — Z01818 Encounter for other preprocedural examination: Secondary | ICD-10-CM | POA: Diagnosis not present

## 2023-12-06 DIAGNOSIS — M1712 Unilateral primary osteoarthritis, left knee: Secondary | ICD-10-CM

## 2023-12-06 MED ORDER — BUPIVACAINE-MELOXICAM ER 400-12 MG/14ML IJ SOLN: 400.0000 mg | Freq: Once | INTRAMUSCULAR | Status: DC

## 2023-12-06 NOTE — Progress Notes (Signed)
 Seizures   Injx 1 week ago   No steps in   All 1 floor   Chief Complaint  Patient presents with   Knee Pain    Left for a couple years     Knee Pain     Vincen is 83 years old who presents for evaluation of ongoing left knee pain.  He says that he has had pain for several years now he has had several cortisone injections, hyaluronic acid injections and a week ago had another aspiration of about 40 cc of fluid followed by injection of cortisone for acute onset of knee pain with disabling pain swelling and inability to walk.  He has subsequently improved significantly after his aspiration injection and is here for evaluation for further treatment options  He says he still very active he likes to mow his lawn.  He is still doing other things that are important for him and he would like to proceed with knee replacement surgery if that is a better answer  He has a history of seizure disorder but has not had one in 21 years he had a recent checkup and has been doing well  He is here with his wife His brother  and other relatives have had knee replacement and did well he is interested in proceeding with that  ROS Mh  Past Surgical History:  Procedure Laterality Date   CHOLECYSTECTOMY  07/25/2012   Procedure: LAPAROSCOPIC CHOLECYSTECTOMY WITH INTRAOPERATIVE CHOLANGIOGRAM;  Surgeon: Shela Derby, MD;  Location: [facility];  Service: General;;   COLONOSCOPY  11/15/2011   Procedure: COLONOSCOPY;  Surgeon: Ruby Corporal, MD;  [facility];  Service: Endoscopy;  Laterality: N/A;  730   COLONOSCOPY WITH PROPOFOL  N/A 07/11/2022   Procedure: COLONOSCOPY WITH PROPOFOL ;  Surgeon: Urban Garden, MD;  [facility];  Service: Gastroenterology;  Laterality: N/A;  730 ASA 3   CYSTOSCOPY WITH LITHOLAPAXY  08/09/2023   Procedure: CYSTOSCOPY WITH LITHOLAPAXY;  Surgeon: Marco Severs, MD;  [facility];  Service: Urology;;   ELBOW SURGERY     chipped bone right  elbow   HOLMIUM LASER APPLICATION  08/09/2023   Procedure: HOLMIUM LASER APPLICATION;  Surgeon: Marco Severs, MD;  [facility];  Service: Urology;;   POLYPECTOMY  07/11/2022   Procedure: POLYPECTOMY INTESTINAL;  Surgeon: Urban Garden, MD;  [facility];  Service: Gastroenterology;;   TONSILLECTOMY     TRANSURETHRAL RESECTION OF PROSTATE N/A 08/09/2023   Procedure: TRANSURETHRAL RESECTION OF THE PROSTATE (TURP);  Surgeon: Marco Severs, MD;  Location: [facility];  Service: Urology;  Laterality: N/A;   VASECTOMY      Social History   Tobacco Use   Smoking status: Former    Current packs/day: 0.00    Types: Cigarettes    Quit date: 07/19/1982    Years since quitting: 41.4   Smokeless tobacco: Never  Substance Use Topics   Alcohol  use: Yes    Alcohol /week: 1.0 standard drink of alcohol     Types: 1 Glasses of wine per week    Comment: mixed drink  once in awhile   Drug use: No    Family History  Family history unknown: Yes    BP (!) 138/94   Pulse (!) 59   Ht 5' 7.5" (1.715 m)   Wt 158 lb (71.7 kg)   BMI 24.38 kg/m   Physical Exam Vitals and nursing note reviewed.  Constitutional:      Appearance:  Normal appearance.  HENT:     Head: Normocephalic and atraumatic.  Eyes:     General: No scleral icterus.       Right eye: No discharge.        Left eye: No discharge.     Extraocular Movements: Extraocular movements intact.     Conjunctiva/sclera: Conjunctivae normal.     Pupils: Pupils are equal, round, and reactive to light.  Cardiovascular:     Rate and Rhythm: Normal rate.     Pulses: Normal pulses.  Musculoskeletal:     Comments: Left knee is in severe varus he has tenderness over the medial joint line crepitance without effusion today.  He stands with the knee flexed he has a flexion contracture which is at least 10 degrees.  Knee flexion arc is from 10 to 120 degrees.  Stability tests were normal.  Skin envelope normal.  No  peripheral edema.  Quadriceps function intact  Skin:    General: Skin is warm and dry.     Capillary Refill: Capillary refill takes less than 2 seconds.  Neurological:     General: No focal deficit present.     Mental Status: He is alert and oriented to person, place, and time.     Gait: Gait abnormal.  Psychiatric:        Mood and Affect: Mood normal.        Behavior: Behavior normal.        Thought Content: Thought content normal.        Judgment: Judgment normal.    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  They again indicate the severe varus deformity and osteoarthritis.  There are secondary bone changes as well.  Assessment and plan 83 year old male history of seizure disorder well-controlled over 21 years disabling left knee pain presents for management of such knee pain.  After showing the patient a model reviewing the surgery postop plan hospital course and complications he is agreed to proceed with knee replacement surgery  Unfortunately his recent cortisone injection requires delay of surgery for 90 days prior to proceeding which she is amenable to  Plan is for left total knee arthroplasty standard cement technique overnight stay in the hospital  Patient has no real steps to get in the house, everything is on 1 floor.

## 2023-12-06 NOTE — Progress Notes (Signed)
  Intake history:  BP (!) 138/94   Pulse (!) 59   Ht 5' 7.5" (1.715 m)   Wt 158 lb (71.7 kg)   BMI 24.38 kg/m  Body mass index is 24.38 kg/m.    WHAT ARE WE SEEING YOU FOR TODAY?   left knee(s)  How long has this bothered you? (DOI?DOS?WS?)  approximately 2 years(s) ago  Anticoag.  No  Diabetes No  Heart disease No  Hypertension No  SMOKING HX No  Kidney disease No  Any ALLERGIES ______________________________________________   Treatment:  Have you taken:  Tylenol  Yes  Advil Yes  Had PT No  Had injection Yes  Other  _______________has had Gel shots and cortizone injeciton most recently 1 week ago __________

## 2023-12-06 NOTE — Patient Instructions (Signed)
 Your surgery will be at [facility] by Dr Phyllis Breeze  plan to be in hospital overnight. The hospital will contact you with a preoperative appointment to discuss Anesthesia.  Please arrive on time or 15 minutes early for the preoperative appointment, they have a very tight schedule if you are late or do not come in your surgery will be cancelled.  The phone number for the preop area is [phone]. Please bring your medications with you for the appointment. They will tell you the arrival time for surgery and medication instructions when you have your preoperative evaluation. Do not wear nail polish the day of your surgery and if you take Phentermine you need to stop this medication ONE WEEK prior to your surgery. f you take Invokana, Farxiga, Jardiance, or Steglatro) - Hold 72 hours before the procedure.  If you take Ozempic,  Bydureo, Mounjaro, [place] or Trulicity do not take for 8 days before your surgery. If you take Victoza, Rybelsis, Saxenda or Adlyxi stop 24 hours before the procedure. Please arrive at the hospital 2 hours before procedure if scheduled at 9:30 or later in the day or at the time the nurse tells you at your preoperative visit.   If you have my chart do not use the time given in my chart use the time given to you by the nurse during your preoperative visit.   Your surgery  time may change. Please be available for phone calls the day of your surgery and the day before. The Short Stay department may need to discuss changes about your surgery time. Not reaching the you could lead to procedure delays and possible cancellation.  You must have a ride home and someone to stay with you for 24 to 48 hours. The person taking you home will receive and sign for the your discharge instructions.  Please be prepared to give your support person's name and telephone number to [employer]. Dr Phyllis Breeze will need that name and phone number post procedure.   You will also get a call from a  representative of [employer], they have a machine that you will use in the first few weeks after surgery. It is called a CPM.   You will have home physical therapy for 2 weeks after surgery, the home health agency will call you before or just following the surgery to set up visits. [facility] is the agency we normally use, unless you request another agency.   You will get a call also from outpatient therapy for therapy starting when the home therapy is done.  If you have questions or need to Reschedule the surgery, call the office ask for Mariona Scholes.

## 2023-12-13 NOTE — Progress Notes (Signed)
 Name: [name] DOB: 05/15/1941 MRN: [id number]  History of Present Illness: [name] is a 83 y.o. male who presents today for follow up visit at [facility]. He is accompanied by his wife Carles Cheadle. GU History includes: 1. BPH with LUTS (urgency, frequency, nocturia x2-3).  - 04/18/2023: Per CT "Marked prostatic calcifications are noted. Prostate is prominent." - 11/14/2023: PSA normal (<0.1). - Previously took Alfuzosin .  - Currently taking Proscar  5 mg daily.  2. Kidney & bladder stones. - 08/09/2023: Stone analysis showed 20% calcium oxalate and 80% hydroxyapatite composition. 3. Bilateral renal cysts.   Recent history: > 08/09/2023: Underwent TURP and cystolithalopaxy by Dr. Claretta Croft. Stone analysis showed 20% calcium oxalate and 80% hydroxyapatite composition.  At last visit on 11/13/2023: - Asymptomatic but UA grossly abnormal.  - Urine culture negative.  Since last visit: > 11/22/2023: RUS & KUB showed a 11 mm left kidney stone with mild hydronephrosis.  Today: He reports that he passed two stones within the past 2 weeks including one two days ago. Today he denies acute flank pain / abdominal pain, fevers, nausea, or vomiting. Reports bothersome nocturia x4. Otherwise denies any urinary concerns.   Medications: Current Outpatient Medications  Medication Sig Dispense Refill   alfuzosin  (UROXATRAL ) 10 MG 24 hr tablet Take 1 tablet (10 mg total) by mouth daily with breakfast. 30 tablet 11   finasteride  (PROSCAR ) 5 MG tablet Take 1 tablet (5 mg total) by mouth daily. (Patient taking differently: Take 5 mg by mouth daily in the afternoon.) 90 tablet 3   levothyroxine  (SYNTHROID ) 75 MCG tablet Take 75 mcg by mouth daily before breakfast.     Multiple Vitamin (MULTIVITAMIN WITH MINERALS) TABS tablet Take 1 tablet by mouth daily at 12 noon.     naproxen  sodium (ALEVE ) 220 MG tablet Take 220 mg by mouth in the morning and at bedtime.     Oxcarbazepine  (TRILEPTAL )  300 MG tablet Take 300 mg by mouth 2 (two) times daily.     sodium chloride  (OCEAN) 0.65 % nasal spray Place 1 spray into the nose daily as needed for congestion.     zonisamide  (ZONEGRAN ) 100 MG capsule Take 400 mg by mouth in the morning.     traMADol  (ULTRAM ) 50 MG tablet Take 1 tablet (50 mg total) by mouth every 6 (six) hours as needed. (Patient not taking: Reported on 12/14/2023) 15 tablet 0   Current Facility-Administered Medications  Medication Dose Route Frequency Provider Last Rate Last Admin   bupivacaine -meloxicam  ER (ZYNRELEF ) injection 400 mg  400 mg Infiltration Once Darrin Emerald, MD        Allergies: Allergies  Allergen Reactions   Sulfa  Antibiotics Hives and Rash    Past Medical History:  Diagnosis Date   Arthritis    History of colon polyps    removed with colonoscopy   History of kidney stones    Hypothyroidism    Seizures (HCC)    none in 10 yrs- controlled with meds(Dr. Pinkie Brigham [phone])   Past Surgical History:  Procedure Laterality Date   CHOLECYSTECTOMY  07/25/2012   Procedure: LAPAROSCOPIC CHOLECYSTECTOMY WITH INTRAOPERATIVE CHOLANGIOGRAM;  Surgeon: Shela Derby, MD;  Location: [facility];  Service: General;;   COLONOSCOPY  11/15/2011   Procedure: COLONOSCOPY;  Surgeon: Ruby Corporal, MD;  Location: [facility];  Service: Endoscopy;  Laterality: N/A;  730   COLONOSCOPY WITH PROPOFOL  N/A 07/11/2022   Procedure: COLONOSCOPY WITH PROPOFOL ;  Surgeon: Urban Garden, MD;  Location:  [facility];  Service: Gastroenterology;  Laterality: N/A;  730 ASA 3   CYSTOSCOPY WITH LITHOLAPAXY  08/09/2023   Procedure: CYSTOSCOPY WITH LITHOLAPAXY;  Surgeon: Marco Severs, MD;  Location: [facility];  Service: Urology;;   ELBOW SURGERY     chipped bone right elbow   HOLMIUM LASER APPLICATION  08/09/2023   Procedure: HOLMIUM LASER APPLICATION;  Surgeon: Marco Severs, MD;  Location: [facility];  Service: Urology;;   POLYPECTOMY  07/11/2022    Procedure: POLYPECTOMY INTESTINAL;  Surgeon: Urban Garden, MD;  Location: [facility];  Service: Gastroenterology;;   TONSILLECTOMY     TRANSURETHRAL RESECTION OF PROSTATE N/A 08/09/2023   Procedure: TRANSURETHRAL RESECTION OF THE PROSTATE (TURP);  Surgeon: Marco Severs, MD;  Location: [facility];  Service: Urology;  Laterality: N/A;   VASECTOMY     Family History  Family history unknown: Yes   Social History   Socioeconomic History   Marital status: Married    Spouse name: Not on file   Number of children: Not on file   Years of education: Not on file   Highest education level: Not on file  Occupational History   Not on file  Tobacco Use   Smoking status: Former    Current packs/day: 0.00    Types: Cigarettes    Quit date: 07/19/1982    Years since quitting: 41.4   Smokeless tobacco: Never  Substance and Sexual Activity   Alcohol  use: Yes    Alcohol /week: 1.0 standard drink of alcohol     Types: 1 Glasses of wine per week    Comment: mixed drink  once in awhile   Drug use: No   Sexual activity: Yes  Other Topics Concern   Not on file  Social History Narrative   Not on file   Social Drivers of Health   Financial Resource Strain: Not on file  Food Insecurity: No Food Insecurity (08/09/2023)   Hunger Vital Sign    Worried About Running Out of Food in the Last Year: Never true    Ran Out of Food in the Last Year: Never true  Transportation Needs: No Transportation Needs (08/09/2023)   PRAPARE - Administrator, Civil Service (Medical): No    Lack of Transportation (Non-Medical): No  Physical Activity: Not on file  Stress: Not on file  Social Connections: Socially Integrated (08/09/2023)   Social Connection and Isolation Panel [NHANES]    Frequency of Communication with Friends and Family: More than three times a week    Frequency of Social Gatherings with Friends and Family: Twice a week    Attends Religious Services: More than 4 times per  year    Active Member of [employer] or Organizations: Yes    Attends Engineer, structural: More than 4 times per year    Marital Status: Married  Catering manager Violence: Not At Risk (08/09/2023)   Humiliation, Afraid, Rape, and Kick questionnaire    Fear of Current or Ex-Partner: No    Emotionally Abused: No    Physically Abused: No    Sexually Abused: No    Review of Systems Constitutional: Patient denies any unintentional weight loss or change in strength lntegumentary: Patient denies any rashes or pruritus Cardiovascular: Patient denies chest pain or syncope Respiratory: Patient denies shortness of breath Gastrointestinal: As per HPI Musculoskeletal: Patient denies muscle cramps or weakness Neurologic: Patient denies convulsions or seizures Allergic/Immunologic: Patient denies recent allergic reaction(s) Hematologic/Lymphatic: Patient denies bleeding tendencies Endocrine:  Patient denies heat/cold intolerance  GU: As per HPI.  OBJECTIVE Vitals:   12/14/23 0859  BP: (!) 153/86  Pulse: 71   There is no height or weight on file to calculate BMI.  Physical Examination Constitutional: No obvious distress; patient is non-toxic appearing  Cardiovascular: No visible lower extremity edema.  Respiratory: The patient does not have audible wheezing/stridor; respirations do not appear labored  Gastrointestinal: Abdomen non-distended Musculoskeletal: Normal ROM of UEs  Skin: No obvious rashes/open sores  Neurologic: CN 2-12 grossly intact Psychiatric: Answered questions appropriately with normal affect  Hematologic/Lymphatic/Immunologic: No obvious bruises or sites of spontaneous bleeding  Urine microscopy: >30 WBC/hpf, 3-10 RBC/hpf, moderate bacteria  ASSESSMENT Nephrolithiasis - Plan: Urinalysis, Routine w reflex microscopic, Calculi, with Photograph, US  RENAL, DG Abd 1 View  Benign prostatic hyperplasia with nocturia - Plan: Urinalysis, Routine w reflex microscopic,  alfuzosin  (UROXATRAL ) 10 MG 24 hr tablet  We reviewed recent history and imaging results. He is currently asymptomatic; abnormal UA likely due to recent stone passage within the past 48 hours. Stone fragments sent for compositional analysis.  We discussed stone management options at this time including observation versus ESWL to address the 11 mm left kidney stone. We discussed the possible risks and benefits of intervention including but not limited to: including pain, infection, sepsis, UTI, ureter perforation, need for stenting, post-op ureteral stricture, hematuria. Patient elected to proceed with observation due to upcoming left TKR surgery in August 2025.   For BPH with LUTS (nocturia) we agreed to trial restarting Uroxatrol (Alfuzosin ) 10 mg daily and will continue Proscar  (Finasteride ) 5 mg daily.  Will plan for follow up in 6 months with RUS and KUB for stone surveillance or sooner if needed. Pt verbalized understanding and agreement. All questions were answered.  PLAN Advised the following: 1. Uroxatrol (Alfuzosin ) 10 mg daily. 2. Proscar  (Finasteride ) 5 mg daily. 3. Adequate daily fluid intake. 4. Return in 6 months (on 06/15/2024) for RUS, KUB, UA, PVR, & f/u with Griselda Lederer NP. 5. Stone analysis.  Orders Placed This Encounter  Procedures   US  RENAL    Standing Status:   Future    Expected Date:   06/15/2024    Expiration Date:   12/13/2024    Reason for Exam (SYMPTOM  OR DIAGNOSIS REQUIRED):   kidney stone known or suspected    Preferred imaging location?:   [facility]   DG Abd 1 View    Standing Status:   Future    Expected Date:   06/15/2024    Expiration Date:   12/13/2024    Reason for Exam (SYMPTOM  OR DIAGNOSIS REQUIRED):   kidney stone    Preferred imaging location?:   [facility]   Urinalysis, Routine w reflex microscopic   Calculi, with Photograph    It has been explained that the patient is to follow regularly with their PCP in addition to  all other providers involved in their care and to follow instructions provided by these respective offices. Patient advised to contact urology clinic if any urologic-pertaining questions, concerns, new symptoms or problems arise in the interim period.  There are no Patient Instructions on file for this visit.  Electronically signed by:  Lauretta Ponto, FNP   12/14/23    9:32 AM

## 2023-12-14 ENCOUNTER — Encounter: Payer: Self-pay | Admitting: Urology

## 2023-12-14 ENCOUNTER — Ambulatory Visit (INDEPENDENT_AMBULATORY_CARE_PROVIDER_SITE_OTHER): Admitting: Urology

## 2023-12-14 VITALS — BP 153/86 | HR 71

## 2023-12-14 DIAGNOSIS — R351 Nocturia: Secondary | ICD-10-CM

## 2023-12-14 DIAGNOSIS — N138 Other obstructive and reflux uropathy: Secondary | ICD-10-CM

## 2023-12-14 DIAGNOSIS — N200 Calculus of kidney: Secondary | ICD-10-CM | POA: Diagnosis not present

## 2023-12-14 DIAGNOSIS — N401 Enlarged prostate with lower urinary tract symptoms: Secondary | ICD-10-CM | POA: Diagnosis not present

## 2023-12-14 LAB — URINALYSIS, ROUTINE W REFLEX MICROSCOPIC
Bilirubin, UA: NEGATIVE
Glucose, UA: NEGATIVE
Ketones, UA: NEGATIVE
Nitrite, UA: POSITIVE — AB
Specific Gravity, UA: 1.015 (ref 1.005–1.030)
Urobilinogen, Ur: 1 mg/dL (ref 0.2–1.0)
pH, UA: 7 (ref 5.0–7.5)

## 2023-12-14 LAB — MICROSCOPIC EXAMINATION: WBC, UA: >30 /HPF — AB (ref 0–5)

## 2023-12-14 MED ORDER — ALFUZOSIN HCL ER 10 MG PO TB24: 10.0000 mg | ORAL_TABLET | Freq: Every day | ORAL | 11 refills | Status: AC

## 2023-12-25 ENCOUNTER — Ambulatory Visit: Payer: Self-pay | Admitting: Urology

## 2023-12-25 LAB — STONE ANALYSIS
Calcium Oxalate Dihydrate: 40 %
Calcium Oxalate Monohydrate: 40 %
Calcium Phosphate (Hydroxyl): 20 %
Weight Calculi: 69 mg

## 2023-12-25 NOTE — Telephone Encounter (Signed)
-----   Message from Lauretta Ponto sent at 12/25/2023  8:59 AM EDT ----- Please let pt know stone analysis showed 80% calcium oxalate and 20% calcium phosphate composition. Please advise adequate fluid intake (around 2 liters per day) and low oxalate diet for stone prevention.

## 2023-12-25 NOTE — Telephone Encounter (Signed)
 Patient was aware and voiced understanding. Low oxalate pamphlet mailed to patient.

## 2024-01-14 ENCOUNTER — Telehealth: Payer: Self-pay | Admitting: Orthopedic Surgery

## 2024-01-14 NOTE — Telephone Encounter (Signed)
 Patient  wanted to ask DR. Margrette about his upcoming surgery on 03/04/24. They forgot when they scheduled it about a time share beach trip they have on  September 14 th.  It will be about 5 weeks out  from his surgery, but they are wondering if Dr VEAR thinks he will  be ok to go?    Surgery  03/04/24 TKR  Beach trip planned 04/06/24  To go or not to go is the question

## 2024-01-15 NOTE — Telephone Encounter (Signed)
 The patient called back, lvm, would like a call back

## 2024-01-15 NOTE — Telephone Encounter (Signed)
 I called him to advise may be ok if he does well May need to cancel if he does not progress well Left message for him to call back and we can discuss

## 2024-01-16 NOTE — Telephone Encounter (Signed)
 I called him again left another message.

## 2024-01-16 NOTE — Telephone Encounter (Signed)
 I spoke to him he will give more thought and decide what he wants to do .

## 2024-01-16 NOTE — Telephone Encounter (Signed)
 DR. MARGRETTE No Dontrelle is returning your call you can call him back at [phone]

## 2024-01-17 DIAGNOSIS — L089 Local infection of the skin and subcutaneous tissue, unspecified: Secondary | ICD-10-CM | POA: Diagnosis not present

## 2024-01-17 DIAGNOSIS — S2096XA Insect bite (nonvenomous) of unspecified parts of thorax, initial encounter: Secondary | ICD-10-CM | POA: Diagnosis not present

## 2024-01-17 DIAGNOSIS — W57XXXA Bitten or stung by nonvenomous insect and other nonvenomous arthropods, initial encounter: Secondary | ICD-10-CM | POA: Diagnosis not present

## 2024-02-13 ENCOUNTER — Telehealth: Payer: Self-pay

## 2024-02-13 DIAGNOSIS — R339 Retention of urine, unspecified: Secondary | ICD-10-CM

## 2024-02-13 NOTE — Telephone Encounter (Signed)
 Patient left a voice message.  Having severe frequent urination. No openings.   Please advise.  Call:  [phone]

## 2024-02-14 NOTE — Telephone Encounter (Signed)
 Patient called with no answer. Message left to return call to office.

## 2024-02-14 NOTE — Telephone Encounter (Signed)
 Returned call to patient who states his only symptoms are as follows: his urine stream starts and stops and he has to go frequently. Patient denies pain, discomfort, or burning. Patient states he feels he empties his bladder well. Patient states he passed a small kidney stone a few days ago and yesterday. Patient wanted to make provider aware to see if he needed to be seen.

## 2024-02-14 NOTE — Telephone Encounter (Signed)
 Patient returned call and made aware of Np's remarks. Patient also advised to increase fluid intake and call office back if symptoms worsen. Patient voiced understanding.

## 2024-02-18 NOTE — Telephone Encounter (Signed)
 Left message for pt to c/b to get further advisement from NP [place]

## 2024-02-18 NOTE — Telephone Encounter (Addendum)
 Patient continuing to have urine flow issues over the weekend.  Increased water  intake with no improvement.  Please advise.   Call:  [phone]

## 2024-02-18 NOTE — Telephone Encounter (Signed)
 Pt states sometimes when he goes his flow is strong then sometimes it almost seems to dribble pt scheduled for f/u appt with NP w/ advisement a message will be sent to his provider

## 2024-02-18 NOTE — Telephone Encounter (Signed)
 Pt returned called and scheduled for NV per NP recommendations

## 2024-02-19 DIAGNOSIS — H04123 Dry eye syndrome of bilateral lacrimal glands: Secondary | ICD-10-CM | POA: Diagnosis not present

## 2024-02-19 DIAGNOSIS — H35033 Hypertensive retinopathy, bilateral: Secondary | ICD-10-CM | POA: Diagnosis not present

## 2024-02-19 DIAGNOSIS — H40013 Open angle with borderline findings, low risk, bilateral: Secondary | ICD-10-CM | POA: Diagnosis not present

## 2024-02-19 DIAGNOSIS — H3562 Retinal hemorrhage, left eye: Secondary | ICD-10-CM | POA: Diagnosis not present

## 2024-02-20 ENCOUNTER — Other Ambulatory Visit: Payer: Self-pay | Admitting: Orthopedic Surgery

## 2024-02-20 DIAGNOSIS — Z85828 Personal history of other malignant neoplasm of skin: Secondary | ICD-10-CM | POA: Diagnosis not present

## 2024-02-20 DIAGNOSIS — M1712 Unilateral primary osteoarthritis, left knee: Secondary | ICD-10-CM

## 2024-02-20 DIAGNOSIS — L814 Other melanin hyperpigmentation: Secondary | ICD-10-CM | POA: Diagnosis not present

## 2024-02-20 DIAGNOSIS — L821 Other seborrheic keratosis: Secondary | ICD-10-CM | POA: Diagnosis not present

## 2024-02-20 DIAGNOSIS — L570 Actinic keratosis: Secondary | ICD-10-CM | POA: Diagnosis not present

## 2024-02-21 ENCOUNTER — Ambulatory Visit

## 2024-02-21 DIAGNOSIS — R351 Nocturia: Secondary | ICD-10-CM

## 2024-02-21 DIAGNOSIS — N401 Enlarged prostate with lower urinary tract symptoms: Secondary | ICD-10-CM | POA: Diagnosis not present

## 2024-02-21 NOTE — Patient Instructions (Addendum)
 Your procedure is scheduled on Tuesday March 04, 2024.  Report to [facility] Main Entrance at 6:00 A.M.   Call this number if you have problems the morning of surgery:  [phone]  If you experience any cold or flu symptoms such as cough, fever, chills, shortness of breath, etc. between now and your scheduled surgery, please notify us  at the above number.   Remember:  Do not eat after midnight.   You may drink clear liquids until  3:30 am .  Clear liquids allowed are:  Water , Juice (No red color; non-citric and without pulp; diabetics please choose diet or no sugar options), Carbonated beverages (diabetics please choose diet or no sugar options), Clear Tea (No creamer, milk, or cream, including half & half and powdered creamer), Black Coffee Only (No creamer, milk or cream, including half & half and powdered creamer), and Clear Sports drink (No red color; diabetics please choose diet or no sugar options)    Take these medicines the morning of surgery with A SIP OF WATER     Alfuzosin ,levothyroxine ,Oxcarbazepine ,zonisamide ,finasteride      Do not wear jewelry, make-up or nail polish, including gel polish,  artificial nails, or any other type of covering on natural nails (fingers and  toes).  Do not wear lotions, powders, or perfumes, or deodorant.  Do not shave 48 hours prior to surgery.  Men may shave face and neck.  Do not bring valuables to the hospital.  [facility] is not responsible for any belongings or valuables.  Contacts, dentures or bridgework may not be worn into surgery.  Leave your suitcase in the car.  After surgery it may be brought to your room.  For patients admitted to the hospital, discharge time will be determined by your treatment team.  Patients discharged the day of surgery will not be allowed to drive home and must have someone be with them for 24 hours.   Special instructions: DO NOT SMOKE TOBACCO OR VAPE 24 HOURS PRIOR TO YOUR PROCEDURE.    Please brush your teeth morning of procedure.   Please read over the following fact sheets that you were given. Anesthesia Post-op Instructions and Care and Recovery After Surgery  Total Knee Replacement Surgery: What to Expect  Total knee replacement is a surgery to replace a damaged knee joint with a new joint that's made of plastic, metal, or ceramic parts. The new joint replaces parts of the thigh bone, lower leg bone, and kneecap. The new joint is called a prosthetic joint or prosthesis. The goal of surgery is to reduce knee pain and help your knee move better. Tell a health care provider about: Any allergies you have. All medicines you take. These include vitamins, herbs, eye drops, and creams. Any problems you or family members have had with anesthesia. Any bleeding problems you have. Any surgeries you've had. Any medical problems you have. Whether you're pregnant or may be pregnant. What are the risks? Your health care provider will talk with you about risks. These may include: Infection. Bleeding. Blood clots. Allergies to medicines. Damage to nerves and other parts of the knee. Problems with the knee, such as: Not being able to move your knee. A feeling that the knee is weak or unstable. Loosening of the new joint. Pain that doesn't go away. What happens before? When to stop eating and drinking Eat and drink only as you've been told. You may be told this: 8 hours before your surgery Stop eating most foods. Do not eat  meat, fried foods, or fatty foods. Eat only light foods, such as toast or crackers. All liquids are OK except energy drinks and alcohol . 6 hours before your surgery Stop eating. Drink only clear liquids, such as water , clear fruit juice, black coffee, plain tea, and sports drinks. Do not drink energy drinks or alcohol . 2 hours before your procedure Stop drinking all liquids. You may be allowed to take medicines with small sips of water . If you do  not eat and drink as told, your surgery may be delayed or canceled. Medicines Ask about changing or stopping: Any medicines you take. Any vitamins, herbs, or supplements you take. Do not take aspirin or ibuprofen unless you're told to. Tests and exams You may have a physical exam. You may have tests, such as: X-rays. An MRI. A CT scan. A bone scan. A blood or pee test. Lifestyle Keep your body and teeth clean. Germs from anywhere in your body can infect your new joint. Talk with your provider about: Dental care and cleanings. Skin care. Exercise as told. If you're overweight, work with your provider to lose weight. Extra weight can affect your knee. Do not smoke, vape, or use nicotine or tobacco. Surgery safety For your safety, you may: Need to wash your skin with a soap that kills germs. Get antibiotics. Have your surgery site marked. Have hair removed at the surgery site. General instructions Do not shave your legs just before surgery. This will be done in the hospital if needed. If you'll be going home right after the surgery, plan to have a responsible adult: Drive you home from the hospital or clinic. You won't be allowed to drive. Stay with you for the time you're told. It's best to have someone care for you for at least 4-6 weeks when you get home. What happens during a total knee replacement surgery? An IV will be put into a vein in your hand or arm. You may be given: A sedative to help you relax. Anesthesia to keep you from feeling pain. A cut will be made in your knee. Damaged parts of your knee will be taken out. Parts of the new joint will be placed over the parts that were taken out. Your cut from surgery will be closed with stitches, staples, skin glue, or tape strips. A bandage will be placed over your cut from surgery. These steps may vary. Ask what you can expect. What happens after? You'll be watched closely until you leave. This includes checking your  pain level, blood pressure, heart rate, and breathing rate. You'll be given medicines for pain. You may keep getting fluids through your IV. You'll be told to move around as much as you can. You'll be taught how to use crutches or a walker and how to exercise at home. You may be told to wear socks called compression stockings to reduce swelling and help prevent blood clots in your legs. This information is not intended to replace advice given to you by your health care provider. Make sure you discuss any questions you have with your health care provider. Document Revised: 04/12/2023 Document Reviewed: 12/14/2022 Elsevier Patient Education  2024 [employer]   General Anesthesia, Adult, Care After The following information offers guidance on how to care for yourself after your procedure. Your health care provider may also give you more specific instructions. If you have problems or questions, contact your health care provider. What can I expect after the procedure? After the procedure, it is common for  people to: Have pain or discomfort at the IV site. Have nausea or vomiting. Have a sore throat or hoarseness. Have trouble concentrating. Feel cold or chills. Feel weak, sleepy, or tired (fatigue). Have soreness and body aches. These can affect parts of the body that were not involved in surgery. Follow these instructions at home: For the time period you were told by your health care provider:  Rest. Do not participate in activities where you could fall or become injured. Do not drive or use machinery. Do not drink alcohol . Do not take sleeping pills or medicines that cause drowsiness. Do not make important decisions or sign legal documents. Do not take care of children on your own. General instructions Drink enough fluid to keep your urine pale yellow. If you have sleep apnea, surgery and certain medicines can increase your risk for breathing problems. Follow instructions from your  health care provider about wearing your sleep device: Anytime you are sleeping, including during daytime naps. While taking prescription pain medicines, sleeping medicines, or medicines that make you drowsy. Return to your normal activities as told by your health care provider. Ask your health care provider what activities are safe for you. Take over-the-counter and prescription medicines only as told by your health care provider. Do not use any products that contain nicotine or tobacco. These products include cigarettes, chewing tobacco, and vaping devices, such as e-cigarettes. These can delay incision healing after surgery. If you need help quitting, ask your health care provider. Contact a health care provider if: You have nausea or vomiting that does not get better with medicine. You vomit every time you eat or drink. You have pain that does not get better with medicine. You cannot urinate or have bloody urine. You develop a skin rash. You have a fever. Get help right away if: You have trouble breathing. You have chest pain. You vomit blood. These symptoms may be an emergency. Get help right away. Call 911. Do not wait to see if the symptoms will go away. Do not drive yourself to the hospital. Summary After the procedure, it is common to have a sore throat, hoarseness, nausea, vomiting, or to feel weak, sleepy, or fatigue. For the time period you were told by your health care provider, do not drive or use machinery. Get help right away if you have difficulty breathing, have chest pain, or vomit blood. These symptoms may be an emergency. This information is not intended to replace advice given to you by your health care provider. Make sure you discuss any questions you have with your health care provider. Document Revised: 10/07/2021 Document Reviewed: 10/07/2021 Elsevier Patient Education  2024 [employer].  How to Use an Incentive Spirometer An incentive spirometer is a tool that  measures how well you are filling your lungs with each breath. Learning to take long, deep breaths using this tool can help you keep your lungs clear and active. This may help to reverse or lessen your chance of developing breathing (pulmonary) problems, especially infection. You may be asked to use a spirometer: After a surgery. If you have a lung problem or a history of smoking. After a long period of time when you have been unable to move or be active. If the spirometer includes an indicator to show the highest number that you have reached, your health care provider or respiratory therapist will help you set a goal. Keep a log of your progress as told by your health care provider. What are the risks?  Breathing too quickly may cause dizziness or cause you to pass out. Take your time so you do not get dizzy or light-headed. If you are in pain, you may need to take pain medicine before doing incentive spirometry. It is harder to take a deep breath if you are having pain. How to use your incentive spirometer  Sit up on the edge of your bed or on a chair. Hold the incentive spirometer so that it is in an upright position. Before you use the spirometer, breathe out normally. Place the mouthpiece in your mouth. Make sure your lips are closed tightly around it. Breathe in slowly and as deeply as you can through your mouth, causing the piston or the ball to rise toward the top of the chamber. Hold your breath for 3-5 seconds, or for as long as possible. If the spirometer includes a coach indicator, use this to guide you in breathing. Slow down your breathing if the indicator goes above the marked areas. Remove the mouthpiece from your mouth and breathe out normally. The piston or ball will return to the bottom of the chamber. Rest for a few seconds, then repeat the steps 10 or more times. Take your time and take a few normal breaths between deep breaths so that you do not get dizzy or light-headed. Do  this every 1-2 hours when you are awake. If the spirometer includes a goal marker to show the highest number you have reached (best effort), use this as a goal to work toward during each repetition. After each set of 10 deep breaths, cough a few times. This will help to make sure that your lungs are clear. If you have an incision on your chest or abdomen from surgery, place a pillow or a rolled-up towel firmly against the incision when you cough. This can help to reduce pain while taking deep breaths and coughing. General tips When you are able to get out of bed: Walk around often. Continue to take deep breaths and cough in order to clear your lungs. Keep using the incentive spirometer until your health care provider says it is okay to stop using it. If you have been in the hospital, you may be told to keep using the spirometer at home. Contact a health care provider if: You are having difficulty using the spirometer. You have trouble using the spirometer as often as instructed. Your pain medicine is not giving enough relief for you to use the spirometer as told. You have a fever. Get help right away if: You develop shortness of breath. You develop a cough with bloody mucus from the lungs. You have fluid or blood coming from an incision site after you cough. Summary An incentive spirometer is a tool that can help you learn to take long, deep breaths to keep your lungs clear and active. You may be asked to use a spirometer after a surgery, if you have a lung problem or a history of smoking, or if you have been inactive for a long period of time. Use your incentive spirometer as instructed every 1-2 hours while you are awake. If you have an incision on your chest or abdomen, place a pillow or a rolled-up towel firmly against your incision when you cough. This will help to reduce pain. Get help right away if you have shortness of breath, you cough up bloody mucus, or blood comes from your  incision when you cough. This information is not intended to replace advice given to you  by your health care provider. Make sure you discuss any questions you have with your health care provider. Document Revised: 05/18/2023 Document Reviewed: 05/18/2023 Elsevier Patient Education  2024 Elsevier Inc.  Chlorhexidine  Bathing to Help Prevent Infection-see instructions given Chlorhexidine  gluconate (CHG) is a germ-killing (antiseptic) wash used to clean the skin. CHG works better than regular soap and water  to kill germs that live on the skin. It can keep them away for about 24 hours. CHG comes as: 4% body wash that is rinsed off in the shower. 2% prepackaged cloths that are not rinsed off. Body wash that is not rinsed off during a sponge bath. Cleaning your skin with CHG helps lower your risk for infection. It may be used if: You are staying in an intensive care unit (ICU). You have a vascular access, like a central line or midline catheter. You have a catheter to drain urine from your bladder. You are on a ventilator. This is a machine that helps you breathe. You need to keep an area of your body clean before or after surgery. Where is CHG used? CHG liquid or cloths are often used in the hospital. Your health care team may give you a daily sponge bath with CHG if you are in the hospital. You may even use these CHG products at home before surgery. Why is CHG used? CHG is used to help prevent infections by removing the germs on your skin. It is often needed when your body's disease-fighting system (immune system) is not working as well as it normally does. This could happen when: You are sick. You need surgery. You have lines, tubes, or other devices going into your body. You need to stay in the hospital. Washing your skin with CHG also helps to kill germs that are very hard to kill with antibiotics. These germs can be found in hospitals and are called multidrug-resistant organisms (MDROs). CHG  can help lessen your risk of getting really sick if you are exposed to MDROs while in the hospital. Can I use CHG if I have a line, tube, or device? Yes, you can use CHG if you have a line, tube, or device. Your health care team will help you wash the line, tube, or device as part of your daily bath. Or, you may be taught how to use CHG at home. Can I take a shower instead of a sponge bath? Your health care team will tell you if you can take a shower. If you are allowed, you will be given instructions on how to do this. Can I use my own skin products with CHG? No, use only CHG. Other soaps, creams, or oils may keep the CHG from working. Ask your health care provider if you can use lotion. Some lotions are compatible with CHG. Is CHG safe? CHG is safe for everyday use. The most common problems are dry skin and mild rashes. Make sure you do not get straight concentrate CHG in your eyes or ears. There is a risk of vision problems or hearing problems if this happens. Suds getting in your eyes is okay. Tell your provider about any changes you notice after washing with CHG. This information is not intended to replace advice given to you by your health care provider. Make sure you discuss any questions you have with your health care provider. Document Revised: 02/23/2022 Document Reviewed: 01/19/2022 Elsevier Patient Education  2024 [employer].

## 2024-02-21 NOTE — Telephone Encounter (Signed)
 Pt seen today for ua and PVR per NP recommendations upon completion (NV encounter for details ) during nv pt stated he can start urination fine then all of sudden it seems his stream stops abruptly  which prompts him having to go to the restroom frequently but only getting dribbles, this only last a day then he goes back to normal the next day, pt also states this started about 2 weeks ago. Per verbal from NP [place] check to see if pt is constipated and if not to continue uroxatral  . Pt denies constipation

## 2024-02-21 NOTE — Progress Notes (Signed)
 Bladder Scan completed today.  Patient can void prior to the bladder scan. Bladder scan result: 83  Performed By: Exie DASEN. CMA  Additional notes- ua seen by NP

## 2024-02-22 LAB — URINALYSIS, ROUTINE W REFLEX MICROSCOPIC
Bilirubin, UA: NEGATIVE
Glucose, UA: NEGATIVE
Ketones, UA: NEGATIVE
Nitrite, UA: NEGATIVE
Protein,UA: NEGATIVE
Specific Gravity, UA: 1.01 (ref 1.005–1.030)
Urobilinogen, Ur: 0.2 mg/dL (ref 0.2–1.0)
pH, UA: 7 (ref 5.0–7.5)

## 2024-02-22 LAB — MICROSCOPIC EXAMINATION: Bacteria, UA: NONE SEEN

## 2024-02-26 ENCOUNTER — Other Ambulatory Visit: Payer: Self-pay

## 2024-02-26 ENCOUNTER — Encounter (HOSPITAL_COMMUNITY): Payer: Self-pay

## 2024-02-26 ENCOUNTER — Encounter (HOSPITAL_COMMUNITY)
Admission: RE | Admit: 2024-02-26 | Discharge: 2024-02-26 | Disposition: A | Discharge status: Home / Self Care | Source: Ambulatory Visit | Attending: Orthopedic Surgery | Admitting: Orthopedic Surgery

## 2024-02-26 VITALS — BP 147/84 | HR 62 | Resp 18 | Ht 67.5 in | Wt 158.1 lb

## 2024-02-26 DIAGNOSIS — M1712 Unilateral primary osteoarthritis, left knee: Secondary | ICD-10-CM | POA: Insufficient documentation

## 2024-02-26 DIAGNOSIS — Z01818 Encounter for other preprocedural examination: Secondary | ICD-10-CM | POA: Diagnosis not present

## 2024-02-26 HISTORY — DX: Cerebral infarction, unspecified: I63.9

## 2024-02-26 LAB — TYPE AND SCREEN
ABO/RH(D): A POS
Antibody Screen: NEGATIVE

## 2024-02-26 LAB — BASIC METABOLIC PANEL WITH GFR
Anion gap: 9 (ref 5–15)
BUN: 20 mg/dL (ref 8–23)
CO2: 20 mmol/L — ABNORMAL LOW (ref 22–32)
Calcium: 9.4 mg/dL (ref 8.9–10.3)
Chloride: 107 mmol/L (ref 98–111)
Creatinine, Ser: 1.14 mg/dL (ref 0.61–1.24)
GFR, Estimated: >60 mL/min (ref >60)
Glucose, Bld: 85 mg/dL (ref 70–99)
Potassium: 4.4 mmol/L (ref 3.5–5.1)
Sodium: 136 mmol/L (ref 135–145)

## 2024-02-26 LAB — CBC WITH DIFFERENTIAL/PLATELET
Abs Immature Granulocytes: 0.02 K/uL (ref 0.00–0.07)
Basophils Absolute: 0 K/uL (ref 0.0–0.1)
Basophils Relative: 1 %
Eosinophils Absolute: 0.1 K/uL (ref 0.0–0.5)
Eosinophils Relative: 2 %
HCT: 37.9 % — ABNORMAL LOW (ref 39.0–52.0)
Hemoglobin: 12.6 g/dL — ABNORMAL LOW (ref 13.0–17.0)
Immature Granulocytes: 0 %
Lymphocytes Relative: 29 %
Lymphs Abs: 1.5 K/uL (ref 0.7–4.0)
MCH: 31 pg (ref 26.0–34.0)
MCHC: 33.2 g/dL (ref 30.0–36.0)
MCV: 93.3 fL (ref 80.0–100.0)
Monocytes Absolute: 0.6 K/uL (ref 0.1–1.0)
Monocytes Relative: 11 %
Neutro Abs: 2.9 K/uL (ref 1.7–7.7)
Neutrophils Relative %: 57 %
Platelets: 181 K/uL (ref 150–400)
RBC: 4.06 MIL/uL — ABNORMAL LOW (ref 4.22–5.81)
RDW: 13.3 % (ref 11.5–15.5)
WBC: 5.1 K/uL (ref 4.0–10.5)
nRBC: 0 % (ref 0.0–0.2)

## 2024-02-26 LAB — PREPARE RBC (CROSSMATCH)

## 2024-02-26 LAB — SURGICAL PCR SCREEN
MRSA, PCR: NEGATIVE
Staphylococcus aureus: NEGATIVE

## 2024-03-03 NOTE — H&P (Signed)
 Chief Complaint  Patient presents with   Knee Pain      Left for a couple years       Knee Pain      [name] is 83 years old who presents for evaluation of ongoing left knee pain.  He says that he has had pain for several years now he has had several cortisone injections, hyaluronic acid injections and a week ago had another aspiration of about 40 cc of fluid followed by injection of cortisone for acute onset of knee pain with disabling pain swelling and inability to walk.  He has subsequently improved significantly after his aspiration injection and is here for evaluation for further treatment options   He says he still very active he likes to mow his lawn.  He is still doing other things that are important for him and he would like to proceed with knee replacement surgery if that is a better answer   He has a history of seizure disorder but has not had one in 21 years he had a recent checkup and has been doing well   He is here with his wife His brother  and other relatives have had knee replacement and did well he is interested in proceeding with that   ROS H/o remote seizure disorder   Otherwise nothing acute         Past Surgical History:  Procedure Laterality Date   CHOLECYSTECTOMY   07/25/2012    Procedure: LAPAROSCOPIC CHOLECYSTECTOMY WITH INTRAOPERATIVE CHOLANGIOGRAM;  Surgeon: Lynda Leos, MD;  Location: [facility];  Service: General;;   COLONOSCOPY   11/15/2011    Procedure: COLONOSCOPY;  Surgeon: Claudis RAYMOND Rivet, MD;  Location: [facility];  Service: Endoscopy;  Laterality: N/A;  730   COLONOSCOPY WITH PROPOFOL  N/A 07/11/2022    Procedure: COLONOSCOPY WITH PROPOFOL ;  Surgeon: Eartha Angelia Sieving, MD;  Location: [facility];  Service: Gastroenterology;  Laterality: N/A;  730 ASA 3   CYSTOSCOPY WITH LITHOLAPAXY   08/09/2023    Procedure: CYSTOSCOPY WITH LITHOLAPAXY;  Surgeon: Sherrilee Belvie CROME, MD;  Location: [facility];  Service: Urology;;   ELBOW SURGERY         chipped bone right elbow   HOLMIUM LASER APPLICATION   08/09/2023    Procedure: HOLMIUM LASER APPLICATION;  Surgeon: Sherrilee Belvie CROME, MD;  Location: [facility];  Service: Urology;;   POLYPECTOMY   07/11/2022    Procedure: POLYPECTOMY INTESTINAL;  Surgeon: Eartha Angelia Sieving, MD;  Location: [facility];  Service: Gastroenterology;;   TONSILLECTOMY       TRANSURETHRAL RESECTION OF PROSTATE N/A 08/09/2023    Procedure: TRANSURETHRAL RESECTION OF THE PROSTATE (TURP);  Surgeon: Sherrilee Belvie CROME, MD;  Location: [facility];  Service: Urology;  Laterality: N/A;   VASECTOMY              Social History  Social History         Tobacco Use   Smoking status: Former      Current packs/day: 0.00      Types: Cigarettes      Quit date: 07/19/1982      Years since quitting: 41.4   Smokeless tobacco: Never  Substance Use Topics   Alcohol  use: Yes      Alcohol /week: 1.0 standard drink of alcohol       Types: 1 Glasses of wine per week      Comment: mixed drink  once in awhile   Drug use: No  Family History  Family history unknown: Yes          BP (!) 138/94   Pulse (!) 59   Ht 5' 7.5 (1.715 m)   Wt 158 lb (71.7 kg)   BMI 24.38 kg/m    Physical Exam Vitals and nursing note reviewed.  Constitutional:      Appearance: Normal appearance.  HENT:     Head: Normocephalic and atraumatic.  Eyes:     General: No scleral icterus.       Right eye: No discharge.        Left eye: No discharge.     Extraocular Movements: Extraocular movements intact.     Conjunctiva/sclera: Conjunctivae normal.     Pupils: Pupils are equal, round, and reactive to light.  Cardiovascular:     Rate and Rhythm: Normal rate.     Pulses: Normal pulses.  Musculoskeletal:     Comments: Left knee is in severe varus he has tenderness over the medial joint line crepitance without effusion today.  He stands with the knee flexed he has a flexion contracture which is at least 10 degrees.  Knee flexion arc  is from 10 to 120 degrees.  Stability tests were normal.  Skin envelope normal.  No peripheral edema.  Quadriceps function intact  Skin:    General: Skin is warm and dry.     Capillary Refill: Capillary refill takes less than 2 seconds.  Neurological:     General: No focal deficit present.     Mental Status: He is alert and oriented to person, place, and time.     Gait: Gait abnormal.  Psychiatric:        Mood and Affect: Mood normal.        Behavior: Behavior normal.        Thought Content: Thought content normal.        Judgment: Judgment normal.      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  They again indicate the severe varus deformity and osteoarthritis.  There are secondary bone changes as well.   Assessment and plan 83 year old male history of seizure disorder well-controlled over 21 years disabling left knee pain presents for management of such knee pain.   After showing the patient a model reviewing the surgery postop plan hospital course and complications he is agreed to proceed with knee replacement surgery   Unfortunately his recent cortisone injection requires delay of surgery for 90 days prior to proceeding which she is amenable to   Plan is for left total knee arthroplasty standard cement technique overnight stay in the hospital   Patient has no real steps to get in the house, everything is on 1 floor.

## 2024-03-03 NOTE — Anesthesia Preprocedure Evaluation (Addendum)
 Anesthesia Evaluation  Patient identified by MRN, date of birth, ID band Patient awake    Reviewed: Allergy & Precautions, H&P , NPO status , Patient's Chart, lab work & pertinent test results, reviewed documented beta blocker date and time   Airway Mallampati: II  TM Distance: >3 FB Neck ROM: full    Dental  (+) Dental Advisory Given, Caps All upper teeth are a bridge:   Pulmonary former smoker   Pulmonary exam normal breath sounds clear to auscultation       Cardiovascular negative cardio ROS Normal cardiovascular exam Rhythm:regular Rate:Normal     Neuro/Psych Seizures -,  neuropathy  Neuromuscular disease CVA  negative psych ROS   GI/Hepatic negative GI ROS, Neg liver ROS,,,  Endo/Other  Hypothyroidism    Renal/GU negative Renal ROS  negative genitourinary   Musculoskeletal  (+) Arthritis , Osteoarthritis,    Abdominal   Peds  Hematology negative hematology ROS (+)   Anesthesia Other Findings   Reproductive/Obstetrics negative OB ROS                              Anesthesia Physical Anesthesia Plan  ASA: 3  Anesthesia Plan: General/Spinal   Post-op Pain Management: Dilaudid  IV and Regional block*   Induction:   PONV Risk Score and Plan: Propofol  infusion  Airway Management Planned: Nasal Cannula and Natural Airway  Additional Equipment: None  Intra-op Plan:   Post-operative Plan:   Informed Consent: I have reviewed the patients History and Physical, chart, labs and discussed the procedure including the risks, benefits and alternatives for the proposed anesthesia with the patient or authorized representative who has indicated his/her understanding and acceptance.     Dental Advisory Given  Plan Discussed with: CRNA  Anesthesia Plan Comments:          Anesthesia Quick Evaluation

## 2024-03-04 ENCOUNTER — Encounter (HOSPITAL_COMMUNITY)
Admission: RE | Disposition: A | Discharge status: Home / Self Care | Payer: Self-pay | Source: Home / Self Care | Attending: Orthopedic Surgery

## 2024-03-04 ENCOUNTER — Observation Stay (HOSPITAL_COMMUNITY)
Admission: RE | Admit: 2024-03-04 | Discharge: 2024-03-05 | Disposition: A | Discharge status: Home / Self Care | Attending: Orthopedic Surgery | Admitting: Orthopedic Surgery

## 2024-03-04 ENCOUNTER — Encounter (HOSPITAL_COMMUNITY): Payer: Self-pay | Admitting: Orthopedic Surgery

## 2024-03-04 ENCOUNTER — Other Ambulatory Visit: Payer: Self-pay

## 2024-03-04 ENCOUNTER — Ambulatory Visit (HOSPITAL_COMMUNITY)

## 2024-03-04 ENCOUNTER — Encounter (HOSPITAL_COMMUNITY): Payer: Self-pay | Admitting: Anesthesiology

## 2024-03-04 ENCOUNTER — Ambulatory Visit (HOSPITAL_COMMUNITY): Payer: Self-pay | Admitting: Anesthesiology

## 2024-03-04 DIAGNOSIS — G8918 Other acute postprocedural pain: Secondary | ICD-10-CM | POA: Diagnosis not present

## 2024-03-04 DIAGNOSIS — M1712 Unilateral primary osteoarthritis, left knee: Secondary | ICD-10-CM | POA: Diagnosis not present

## 2024-03-04 DIAGNOSIS — Z471 Aftercare following joint replacement surgery: Secondary | ICD-10-CM | POA: Diagnosis not present

## 2024-03-04 DIAGNOSIS — Z7982 Long term (current) use of aspirin: Secondary | ICD-10-CM | POA: Insufficient documentation

## 2024-03-04 DIAGNOSIS — Z01818 Encounter for other preprocedural examination: Principal | ICD-10-CM

## 2024-03-04 DIAGNOSIS — Z96652 Presence of left artificial knee joint: Secondary | ICD-10-CM | POA: Diagnosis not present

## 2024-03-04 DIAGNOSIS — E039 Hypothyroidism, unspecified: Secondary | ICD-10-CM

## 2024-03-04 DIAGNOSIS — Z87891 Personal history of nicotine dependence: Secondary | ICD-10-CM | POA: Diagnosis not present

## 2024-03-04 DIAGNOSIS — Z8673 Personal history of transient ischemic attack (TIA), and cerebral infarction without residual deficits: Secondary | ICD-10-CM | POA: Diagnosis not present

## 2024-03-04 DIAGNOSIS — F17200 Nicotine dependence, unspecified, uncomplicated: Secondary | ICD-10-CM | POA: Diagnosis not present

## 2024-03-04 DIAGNOSIS — I639 Cerebral infarction, unspecified: Secondary | ICD-10-CM | POA: Diagnosis not present

## 2024-03-04 DIAGNOSIS — F1090 Alcohol use, unspecified, uncomplicated: Secondary | ICD-10-CM | POA: Insufficient documentation

## 2024-03-04 DIAGNOSIS — R609 Edema, unspecified: Secondary | ICD-10-CM | POA: Diagnosis not present

## 2024-03-04 HISTORY — PX: TOTAL KNEE ARTHROPLASTY: SHX125

## 2024-03-04 LAB — CBC
HCT: 33.9 % — ABNORMAL LOW (ref 39.0–52.0)
Hemoglobin: 11 g/dL — ABNORMAL LOW (ref 13.0–17.0)
MCH: 30.6 pg (ref 26.0–34.0)
MCHC: 32.4 g/dL (ref 30.0–36.0)
MCV: 94.2 fL (ref 80.0–100.0)
Platelets: 180 K/uL (ref 150–400)
RBC: 3.6 MIL/uL — ABNORMAL LOW (ref 4.22–5.81)
RDW: 13 % (ref 11.5–15.5)
WBC: 6.9 K/uL (ref 4.0–10.5)
nRBC: 0 % (ref 0.0–0.2)

## 2024-03-04 LAB — CREATININE, SERUM
Creatinine, Ser: 1 mg/dL (ref 0.61–1.24)
GFR, Estimated: >60 mL/min (ref >60)

## 2024-03-04 SURGERY — ARTHROPLASTY, KNEE, TOTAL
Anesthesia: Spinal | Site: Knee | Laterality: Left

## 2024-03-04 MED ORDER — METOCLOPRAMIDE HCL 5 MG/ML IJ SOLN: 5.0000 mg | Freq: Three times a day (TID) | INTRAMUSCULAR | Status: DC | PRN

## 2024-03-04 MED ORDER — KETOROLAC TROMETHAMINE 30 MG/ML IJ SOLN
15.0000 mg | Freq: Once | INTRAMUSCULAR | Status: AC
  Administered 2024-03-04 (×2): 15 mg via INTRAVENOUS
  Filled 2024-03-04: qty 1

## 2024-03-04 MED ORDER — CEFAZOLIN SODIUM-DEXTROSE 2-4 GM/100ML-% IV SOLN
2.0000 g | INTRAVENOUS | Status: AC
  Administered 2024-03-04 (×2): 2 g via INTRAVENOUS
  Filled 2024-03-04: qty 100

## 2024-03-04 MED ORDER — HYDROMORPHONE HCL 1 MG/ML IJ SOLN
0.2500 mg | INTRAMUSCULAR | Status: DC | PRN
  Administered 2024-03-04 (×2): 0.5 mg via INTRAVENOUS
  Filled 2024-03-04: qty 0.5

## 2024-03-04 MED ORDER — PROPOFOL 10 MG/ML IV BOLUS
INTRAVENOUS | Status: AC
  Filled 2024-03-04: qty 20

## 2024-03-04 MED ORDER — BUPIVACAINE IN DEXTROSE 0.75-8.25 % IT SOLN
INTRATHECAL | Status: DC | PRN
Start: 2024-03-04 — End: 2024-03-04
  Administered 2024-03-04 (×2): 1.6 mL via INTRATHECAL

## 2024-03-04 MED ORDER — METOCLOPRAMIDE HCL 10 MG PO TABS: 5.0000 mg | ORAL_TABLET | Freq: Three times a day (TID) | ORAL | Status: DC | PRN

## 2024-03-04 MED ORDER — LEVOTHYROXINE SODIUM 75 MCG PO TABS
75.0000 ug | ORAL_TABLET | Freq: Every day | ORAL | Status: DC
  Administered 2024-03-05 (×2): 75 ug via ORAL
  Filled 2024-03-04: qty 1

## 2024-03-04 MED ORDER — ACETAMINOPHEN 500 MG PO TABS
1000.0000 mg | ORAL_TABLET | Freq: Once | ORAL | Status: AC
  Administered 2024-03-04 (×2): 1000 mg via ORAL
  Filled 2024-03-04: qty 2

## 2024-03-04 MED ORDER — HYDROCODONE-ACETAMINOPHEN 7.5-325 MG PO TABS: 1.0000 | ORAL_TABLET | ORAL | Status: DC | PRN

## 2024-03-04 MED ORDER — METHOCARBAMOL 1000 MG/10ML IJ SOLN: 500.0000 mg | Freq: Four times a day (QID) | INTRAMUSCULAR | Status: DC | PRN

## 2024-03-04 MED ORDER — 0.9 % SODIUM CHLORIDE (POUR BTL) OPTIME
TOPICAL | Status: DC | PRN
  Administered 2024-03-04 (×2): 1000 mL

## 2024-03-04 MED ORDER — ENOXAPARIN SODIUM 30 MG/0.3ML IJ SOSY
30.0000 mg | PREFILLED_SYRINGE | INTRAMUSCULAR | Status: DC
  Administered 2024-03-05 (×2): 30 mg via SUBCUTANEOUS
  Filled 2024-03-04: qty 0.3

## 2024-03-04 MED ORDER — BUPIVACAINE LIPOSOME 1.3 % IJ SUSP
INTRAMUSCULAR | Status: AC
  Filled 2024-03-04: qty 20

## 2024-03-04 MED ORDER — MORPHINE SULFATE (PF) 2 MG/ML IV SOLN: 0.5000 mg | INTRAVENOUS | Status: DC | PRN

## 2024-03-04 MED ORDER — SALINE SPRAY 0.65 % NA SOLN: 1.0000 | Freq: Every day | NASAL | Status: DC | PRN

## 2024-03-04 MED ORDER — MENTHOL 3 MG MT LOZG: 1.0000 | LOZENGE | OROMUCOSAL | Status: DC | PRN

## 2024-03-04 MED ORDER — ONDANSETRON HCL 4 MG/2ML IJ SOLN
INTRAMUSCULAR | Status: AC
  Filled 2024-03-04: qty 2

## 2024-03-04 MED ORDER — SODIUM CHLORIDE 0.9 % IV SOLN: INTRAVENOUS | Status: DC

## 2024-03-04 MED ORDER — GLYCOPYRROLATE PF 0.2 MG/ML IJ SOSY
PREFILLED_SYRINGE | INTRAMUSCULAR | Status: AC
  Filled 2024-03-04: qty 1

## 2024-03-04 MED ORDER — TRANEXAMIC ACID-NACL 1000-0.7 MG/100ML-% IV SOLN
1000.0000 mg | INTRAVENOUS | Status: AC
  Administered 2024-03-04 (×2): 1000 mg via INTRAVENOUS
  Filled 2024-03-04: qty 100

## 2024-03-04 MED ORDER — METHOCARBAMOL 500 MG PO TABS: 500.0000 mg | ORAL_TABLET | Freq: Four times a day (QID) | ORAL | Status: DC | PRN

## 2024-03-04 MED ORDER — DEXAMETHASONE SODIUM PHOSPHATE 4 MG/ML IJ SOLN
INTRAMUSCULAR | Status: AC
  Administered 2024-03-04 (×2): 8 mg
  Filled 2024-03-04: qty 2

## 2024-03-04 MED ORDER — ACETAMINOPHEN 500 MG PO TABS
1000.0000 mg | ORAL_TABLET | Freq: Four times a day (QID) | ORAL | Status: DC
  Administered 2024-03-04 – 2024-03-05 (×6): 1000 mg via ORAL
  Filled 2024-03-04 (×3): qty 2

## 2024-03-04 MED ORDER — BUPIVACAINE-MELOXICAM ER 200-6 MG/7ML IJ SOLN
INTRAMUSCULAR | Status: AC
  Filled 2024-03-04: qty 2

## 2024-03-04 MED ORDER — ORAL CARE MOUTH RINSE: 15.0000 mL | Freq: Once | OROMUCOSAL | Status: AC

## 2024-03-04 MED ORDER — METRONIDAZOLE 1 % EX GEL: 1.0000 | Freq: Every day | CUTANEOUS | Status: DC

## 2024-03-04 MED ORDER — PROPOFOL 500 MG/50ML IV EMUL
INTRAVENOUS | Status: AC
  Filled 2024-03-04: qty 50

## 2024-03-04 MED ORDER — DEXMEDETOMIDINE HCL IN NACL 80 MCG/20ML IV SOLN
INTRAVENOUS | Status: DC | PRN
  Administered 2024-03-04 (×4): 8 ug via INTRAVENOUS

## 2024-03-04 MED ORDER — ONDANSETRON HCL 4 MG/2ML IJ SOLN: 4.0000 mg | Freq: Four times a day (QID) | INTRAMUSCULAR | Status: DC | PRN

## 2024-03-04 MED ORDER — ZONISAMIDE 100 MG PO CAPS
400.0000 mg | ORAL_CAPSULE | Freq: Every morning | ORAL | Status: DC
Start: 2024-03-05 — End: 2024-03-05
  Administered 2024-03-05 (×2): 400 mg via ORAL
  Filled 2024-03-04 (×2): qty 4

## 2024-03-04 MED ORDER — ACETAMINOPHEN 160 MG/5ML PO SOLN
960.0000 mg | Freq: Once | ORAL | Status: AC
  Filled 2024-03-04: qty 30

## 2024-03-04 MED ORDER — BISACODYL 5 MG PO TBEC: 5.0000 mg | DELAYED_RELEASE_TABLET | Freq: Every day | ORAL | Status: DC | PRN

## 2024-03-04 MED ORDER — CEFAZOLIN SODIUM-DEXTROSE 2-4 GM/100ML-% IV SOLN
2.0000 g | Freq: Four times a day (QID) | INTRAVENOUS | Status: AC
  Administered 2024-03-04 (×4): 2 g via INTRAVENOUS
  Filled 2024-03-04 (×2): qty 100

## 2024-03-04 MED ORDER — GLYCOPYRROLATE PF 0.2 MG/ML IJ SOSY
PREFILLED_SYRINGE | INTRAMUSCULAR | Status: DC | PRN
Start: 2024-03-04 — End: 2024-03-04
  Administered 2024-03-04 (×2): .2 mg via INTRAVENOUS

## 2024-03-04 MED ORDER — BUPIVACAINE IN DEXTROSE 0.75-8.25 % IT SOLN
INTRATHECAL | Status: AC
  Filled 2024-03-04: qty 2

## 2024-03-04 MED ORDER — BUPIVACAINE HCL (PF) 0.5 % IJ SOLN
INTRAMUSCULAR | Status: AC
  Filled 2024-03-04: qty 30

## 2024-03-04 MED ORDER — ONDANSETRON HCL 4 MG/2ML IJ SOLN
INTRAMUSCULAR | Status: DC | PRN
Start: 2024-03-04 — End: 2024-03-04
  Administered 2024-03-04 (×2): 4 mg via INTRAVENOUS

## 2024-03-04 MED ORDER — DEXAMETHASONE SODIUM PHOSPHATE 10 MG/ML IJ SOLN
8.0000 mg | Freq: Once | INTRAMUSCULAR | Status: DC
  Filled 2024-03-04: qty 0.8

## 2024-03-04 MED ORDER — ONDANSETRON HCL 4 MG PO TABS: 4.0000 mg | ORAL_TABLET | Freq: Four times a day (QID) | ORAL | Status: DC | PRN

## 2024-03-04 MED ORDER — EPHEDRINE 5 MG/ML INJ
INTRAVENOUS | Status: AC
  Filled 2024-03-04: qty 5

## 2024-03-04 MED ORDER — FENTANYL CITRATE (PF) 100 MCG/2ML IJ SOLN
INTRAMUSCULAR | Status: AC
  Filled 2024-03-04: qty 2

## 2024-03-04 MED ORDER — OXCARBAZEPINE 300 MG PO TABS
300.0000 mg | ORAL_TABLET | Freq: Two times a day (BID) | ORAL | Status: DC
  Administered 2024-03-04 – 2024-03-05 (×4): 300 mg via ORAL
  Filled 2024-03-04 (×2): qty 1

## 2024-03-04 MED ORDER — ADULT MULTIVITAMIN W/MINERALS CH
1.0000 | ORAL_TABLET | Freq: Every day | ORAL | Status: DC
  Administered 2024-03-04 – 2024-03-05 (×4): 1 via ORAL
  Filled 2024-03-04 (×2): qty 1

## 2024-03-04 MED ORDER — FENTANYL CITRATE (PF) 100 MCG/2ML IJ SOLN
INTRAMUSCULAR | Status: AC
Start: 2024-03-04 — End: 2024-03-04
  Filled 2024-03-04: qty 2

## 2024-03-04 MED ORDER — EPHEDRINE SULFATE-NACL 50-0.9 MG/10ML-% IV SOSY
PREFILLED_SYRINGE | INTRAVENOUS | Status: DC | PRN
  Administered 2024-03-04 (×2): 5 mg via INTRAVENOUS

## 2024-03-04 MED ORDER — HYDROCODONE-ACETAMINOPHEN 5-325 MG PO TABS
1.0000 | ORAL_TABLET | ORAL | Status: DC
  Administered 2024-03-04 (×2): 1 via ORAL
  Administered 2024-03-05 (×2): 2 via ORAL
  Filled 2024-03-04: qty 1
  Filled 2024-03-04: qty 2

## 2024-03-04 MED ORDER — LIDOCAINE 2% (20 MG/ML) 5 ML SYRINGE
INTRAMUSCULAR | Status: AC
  Filled 2024-03-04: qty 5

## 2024-03-04 MED ORDER — DOCUSATE SODIUM 100 MG PO CAPS
100.0000 mg | ORAL_CAPSULE | Freq: Two times a day (BID) | ORAL | Status: DC
  Administered 2024-03-04 – 2024-03-05 (×4): 100 mg via ORAL
  Filled 2024-03-04 (×2): qty 1

## 2024-03-04 MED ORDER — ALFUZOSIN HCL ER 10 MG PO TB24
10.0000 mg | ORAL_TABLET | Freq: Every day | ORAL | Status: DC
  Filled 2024-03-04 (×2): qty 1

## 2024-03-04 MED ORDER — FENTANYL CITRATE (PF) 100 MCG/2ML IJ SOLN
INTRAMUSCULAR | Status: DC | PRN
  Administered 2024-03-04 (×8): 25 ug via INTRAVENOUS
  Administered 2024-03-04: 50 ug via INTRAVENOUS
  Administered 2024-03-04 (×3): 25 ug via INTRAVENOUS
  Administered 2024-03-04: 50 ug via INTRAVENOUS
  Administered 2024-03-04: 25 ug via INTRAVENOUS

## 2024-03-04 MED ORDER — PHENOL 1.4 % MT LIQD: 1.0000 | OROMUCOSAL | Status: DC | PRN

## 2024-03-04 MED ORDER — LACTATED RINGERS IV SOLN: INTRAVENOUS | Status: DC

## 2024-03-04 MED ORDER — PROPOFOL 500 MG/50ML IV EMUL
INTRAVENOUS | Status: DC | PRN
  Administered 2024-03-04 (×2): 80 ug/kg/min via INTRAVENOUS

## 2024-03-04 MED ORDER — POLYETHYLENE GLYCOL 3350 17 G PO PACK: 17.0000 g | PACK | Freq: Every day | ORAL | Status: DC | PRN

## 2024-03-04 MED ORDER — FINASTERIDE 5 MG PO TABS
5.0000 mg | ORAL_TABLET | Freq: Every day | ORAL | Status: DC
  Administered 2024-03-05 (×2): 5 mg via ORAL
  Filled 2024-03-04: qty 1

## 2024-03-04 MED ORDER — ROPIVACAINE HCL 5 MG/ML IJ SOLN
INTRAMUSCULAR | Status: AC
Start: 2024-03-04 — End: 2024-03-04
  Filled 2024-03-04: qty 30

## 2024-03-04 MED ORDER — CHLORHEXIDINE GLUCONATE 0.12 % MT SOLN
15.0000 mL | Freq: Once | OROMUCOSAL | Status: AC
  Administered 2024-03-04 (×2): 15 mL via OROMUCOSAL
  Filled 2024-03-04: qty 15

## 2024-03-04 MED ORDER — PHENYLEPHRINE HCL-NACL 20-0.9 MG/250ML-% IV SOLN
INTRAVENOUS | Status: DC | PRN
  Administered 2024-03-04 (×2): 15 ug/min via INTRAVENOUS

## 2024-03-04 MED ORDER — BUPIVACAINE-MELOXICAM ER 200-6 MG/7ML IJ SOLN
INTRAMUSCULAR | Status: DC | PRN
  Administered 2024-03-04 (×2): 400 mg

## 2024-03-04 MED ORDER — LIDOCAINE HCL (PF) 1 % IJ SOLN
INTRAMUSCULAR | Status: AC
  Filled 2024-03-04: qty 30

## 2024-03-04 MED ORDER — POVIDONE-IODINE 10 % EX SWAB
2.0000 | Freq: Once | CUTANEOUS | Status: AC
  Administered 2024-03-04 (×2): 2 via TOPICAL

## 2024-03-04 MED ORDER — ACETAMINOPHEN 325 MG PO TABS: 325.0000 mg | ORAL_TABLET | Freq: Four times a day (QID) | ORAL | Status: DC | PRN

## 2024-03-04 MED ORDER — SODIUM CHLORIDE 0.9 % IR SOLN
Status: DC | PRN
  Administered 2024-03-04 (×2): 3000 mL

## 2024-03-04 MED ORDER — OXYCODONE HCL 5 MG PO TABS: 5.0000 mg | ORAL_TABLET | ORAL | Status: DC | PRN

## 2024-03-04 MED ORDER — ACETAMINOPHEN 500 MG PO TABS
500.0000 mg | ORAL_TABLET | Freq: Four times a day (QID) | ORAL | Status: DC
  Administered 2024-03-04 (×2): 500 mg via ORAL
  Filled 2024-03-04: qty 1

## 2024-03-04 MED ORDER — TRANEXAMIC ACID-NACL 1000-0.7 MG/100ML-% IV SOLN
1000.0000 mg | Freq: Once | INTRAVENOUS | Status: AC
  Administered 2024-03-04 (×2): 1000 mg via INTRAVENOUS
  Filled 2024-03-04: qty 100

## 2024-03-04 SURGICAL SUPPLY — 57 items
ATTUNE MED DOME PAT 38 KNEE (Knees) IMPLANT
ATTUNE PS FEM LT SZ 8 CEM KNEE (Femur) IMPLANT
BANDAGE ESMARK 6X9 LF (GAUZE/BANDAGES/DRESSINGS) ×2 IMPLANT
BASEPLATE TIB CMT FB PCKT SZ 8 (Knees) IMPLANT
BLADE SAGITTAL 25.0X1.27X90 (BLADE) ×2 IMPLANT
BLADE SAW SGTL 11.0X1.19X90.0M (BLADE) ×2 IMPLANT
CEMENT HV SMART SET (Cement) ×4 IMPLANT
CLOTH BEACON ORANGE TIMEOUT ST (SAFETY) ×2 IMPLANT
COOLER ICEMAN CLASSIC (MISCELLANEOUS) ×2 IMPLANT
COUNTER NDL MAGNETIC 40 RED (SET/KITS/TRAYS/PACK) ×2 IMPLANT
COUNTER NEEDLE MAGNETIC 40 RED (SET/KITS/TRAYS/PACK) ×1 IMPLANT
COVER LIGHT HANDLE STERIS (MISCELLANEOUS) ×4 IMPLANT
CUFF TRNQT CYL 34X4.125X (TOURNIQUET CUFF) ×2 IMPLANT
DRAPE BACK TABLE (DRAPES) ×2 IMPLANT
DRAPE EXTREMITY T 121X128X90 (DISPOSABLE) ×2 IMPLANT
DRESSING AQUACEL AG ADV 3.5X12 (MISCELLANEOUS) ×2 IMPLANT
DURAPREP 26ML APPLICATOR (WOUND CARE) ×4 IMPLANT
ELECTRODE REM PT RTRN 9FT ADLT (ELECTROSURGICAL) ×2 IMPLANT
GLOVE BIO SURGEON STRL SZ7 (GLOVE) IMPLANT
GLOVE BIOGEL PI IND STRL 7.0 (GLOVE) ×12 IMPLANT
GLOVE BIOGEL PI IND STRL 8.5 (GLOVE) ×2 IMPLANT
GLOVE ECLIPSE 6.5 STRL STRAW (GLOVE) IMPLANT
GLOVE ECLIPSE 7.0 STRL STRAW (GLOVE) IMPLANT
GLOVE SKINSENSE STRL SZ8.0 LF (GLOVE) ×2 IMPLANT
GOWN STRL REUS W/TWL LRG LVL3 (GOWN DISPOSABLE) ×6 IMPLANT
GOWN STRL REUS W/TWL XL LVL3 (GOWN DISPOSABLE) ×2 IMPLANT
HOOD PEEL AWAY T7 (MISCELLANEOUS) ×8 IMPLANT
INSERT TIB ATTUNE FB SZ8X6 (Insert) IMPLANT
INST SET MAJOR BONE (KITS) ×2 IMPLANT
KIT TURNOVER KIT A (KITS) ×2 IMPLANT
MANIFOLD NEPTUNE II (INSTRUMENTS) ×2 IMPLANT
MARKER SKIN DUAL TIP RULER LAB (MISCELLANEOUS) ×2 IMPLANT
NS IRRIG 1000ML POUR BTL (IV SOLUTION) ×2 IMPLANT
PACK TOTAL JOINT (CUSTOM PROCEDURE TRAY) ×2 IMPLANT
PAD ARMBOARD POSITIONER FOAM (MISCELLANEOUS) ×2 IMPLANT
PAD COLD SHLDR SM WRAP-ON (PAD) ×2 IMPLANT
PENCIL SMOKE EVACUATOR COATED (MISCELLANEOUS) ×2 IMPLANT
PILLOW KNEE EXTENSION 0 DEG (MISCELLANEOUS) ×2 IMPLANT
PIN STEINMAN FIXATION KNEE (PIN) ×2 IMPLANT
POSITIONER HEAD 8X9X4 ADT (SOFTGOODS) ×2 IMPLANT
SAW OSC TIP CART 19.5X105X1.3 (SAW) ×2 IMPLANT
SET BASIN LINEN APH (SET/KITS/TRAYS/PACK) ×2 IMPLANT
SET HNDPC FAN SPRY TIP SCT (DISPOSABLE) ×2 IMPLANT
SOL .9 NS 3000ML IRR UROMATIC (IV SOLUTION) ×2 IMPLANT
SOLUTION IRRIG SURGIPHOR (IV SOLUTION) ×2 IMPLANT
SPONGE T-LAP 18X18 ~~LOC~~+RFID (SPONGE) IMPLANT
STAPLER SKIN PROX WIDE 3.9 (STAPLE) ×2 IMPLANT
SUT BRALON NAB BRD #1 30IN (SUTURE) ×2 IMPLANT
SUT MNCRL 0 VIOLET CTX 36 (SUTURE) ×2 IMPLANT
SUT MON AB 0 CT1 (SUTURE) ×2 IMPLANT
SYR BULB IRRIG 60ML STRL (SYRINGE) ×2 IMPLANT
TOWEL OR 17X26 4PK STRL BLUE (TOWEL DISPOSABLE) ×2 IMPLANT
TOWER CARTRIDGE SMART MIX (DISPOSABLE) ×2 IMPLANT
TRAY FOLEY MTR SLVR 16FR STAT (SET/KITS/TRAYS/PACK) ×2 IMPLANT
TUBING CONNECTOR 18X5MM (MISCELLANEOUS) ×2 IMPLANT
WATER STERILE IRR 1000ML POUR (IV SOLUTION) ×4 IMPLANT
YANKAUER SUCT 12FT TUBE ARGYLE (SUCTIONS) ×2 IMPLANT

## 2024-03-04 NOTE — Anesthesia Postprocedure Evaluation (Signed)
 Anesthesia Post Note  Patient: [name]  Procedure(s) Performed: ARTHROPLASTY, KNEE, TOTAL (Left: Knee)  Patient location during evaluation: PACU Anesthesia Type: Combined General/Spinal Level of consciousness: awake and alert Pain management: pain level controlled Vital Signs Assessment: post-procedure vital signs reviewed and stable Respiratory status: spontaneous breathing, nonlabored ventilation, respiratory function stable and patient connected to nasal cannula oxygen Cardiovascular status: blood pressure returned to baseline and stable Postop Assessment: no apparent nausea or vomiting Anesthetic complications: no   There were no known notable events for this encounter.   Last Vitals:  Vitals:   03/04/24 1045 03/04/24 1100  BP: 123/64 136/74  Pulse: 60 (!) 48  Resp: 14 (!) 9  Temp:    SpO2: 98% 100%    Last Pain:  Vitals:   03/04/24 1118  PainSc: 6                  Jadakiss Barish L Jasma Seevers

## 2024-03-04 NOTE — Anesthesia Procedure Notes (Signed)
 Anesthesia Regional Block: Adductor canal block   Pre-Anesthetic Checklist: , timeout performed,  Correct Patient, Correct Site, Correct Laterality,  Correct Procedure, Correct Position, site marked,  Risks and benefits discussed,  Surgical consent,  Pre-op evaluation,  At surgeon's request and post-op pain management  Laterality: Left  Prep: chloraprep       Needles:  Injection technique: Single-shot  Needle Type: Echogenic Needle     Needle Length: 4cm  Needle Gauge: 21   Needle insertion depth: 3 cm   Additional Needles:   Procedures:, nerve stimulator,,, ultrasound used (permanent image in chart),,    Narrative:  Start time: 03/04/2024 10:40 AM End time: 03/04/2024 10:47 AM Injection made incrementally with aspirations every 5 mL.  Performed by: With CRNAs  Anesthesiologist: Landry Dunnings, MD CRNA: Augusta Daved SAILOR, CRNA  Additional Notes: 10cc exparel  and 10cc 0.5% bupivicaine used with 5cc to area around nerve to vastus medialis and 15cc in adductor canal sheath with excellent ultra sound visualization of the artery deforming with the bolus injection at right upper quadrant of sheath

## 2024-03-04 NOTE — Plan of Care (Signed)
  Problem: Acute Rehab PT Goals(only PT should resolve) Goal: Patient Will Transfer Sit To/From Stand Outcome: Progressing Flowsheets (Taken 03/04/2024 1530) Patient will transfer sit to/from stand:  with supervision  with modified independence Goal: Pt Will Transfer Bed To Chair/Chair To Bed Outcome: Progressing Flowsheets (Taken 03/04/2024 1530) Pt will Transfer Bed to Chair/Chair to Bed:  with supervision  with modified independence Goal: Pt Will Ambulate Outcome: Progressing Flowsheets (Taken 03/04/2024 1530) Pt will Ambulate:  > 125 feet  100 feet  with rolling walker  with supervision Goal: Pt Will Go Up/Down Stairs Outcome: Progressing Flowsheets (Taken 03/04/2024 1530) Pt will Go Up / Down Stairs:  3-5 stairs  with minimal assist  with moderate assist

## 2024-03-04 NOTE — Anesthesia Procedure Notes (Signed)
 Spinal  Patient location during procedure: OR Start time: 03/04/2024 7:45 AM End time: 03/04/2024 7:54 AM Reason for block: surgical anesthesia Staffing Performed: anesthesiologist and resident/CRNA  Anesthesiologist: Landry Dunnings, MD Resident/CRNA: Augusta Daved SAILOR, CRNA Performed by: Augusta Daved SAILOR, CRNA Authorized by: Landry Dunnings, MD   Preanesthetic Checklist Completed: patient identified, IV checked, site marked, risks and benefits discussed, surgical consent, monitors and equipment checked, pre-op evaluation and timeout performed Spinal Block Patient position: sitting Prep: DuraPrep Patient monitoring: heart rate, cardiac monitor, continuous pulse ox and blood pressure Approach: midline Location: L2-3 Injection technique: single-shot Needle Needle type: Pencan  Needle gauge: 24 G Needle length: 9 cm Assessment Sensory level: T4 Events: CSF return and second provider

## 2024-03-04 NOTE — Transfer of Care (Signed)
 Immediate Anesthesia Transfer of Care Note  Patient: [name]  Procedure(s) Performed: ARTHROPLASTY, KNEE, TOTAL (Left: Knee)  Patient Location: PACU  Anesthesia Type:Spinal  Level of Consciousness: oriented, drowsy, and patient cooperative  Airway & Oxygen Therapy: Patient Spontanous Breathing and Patient connected to face mask oxygen  Post-op Assessment: Report given to RN and Post -op Vital signs reviewed and stable  Post vital signs: Reviewed and stable  Last Vitals:  Vitals Value Taken Time  BP 112/85 03/04/24 10:26  Temp 36.5 C 03/04/24 10:27  Pulse 62 03/04/24 10:28  Resp 9 03/04/24 10:28  SpO2 99 % 03/04/24 10:28  Vitals shown include unfiled device data.  Last Pain:  Vitals:   03/04/24 0651  PainSc: 0-No pain         Complications: No notable events documented.

## 2024-03-04 NOTE — Interval H&P Note (Signed)
 History and Physical Interval Note:  03/04/2024 6:56 AM  [name]  has presented today for surgery, with the diagnosis of Osteoarthritis left knee.  The various methods of treatment have been discussed with the patient and family. After consideration of risks, benefits and other options for treatment, the patient has consented to  Procedure(s): ARTHROPLASTY, KNEE, TOTAL (Left) as a surgical intervention.  The patient's history has been reviewed, patient examined, no change in status, stable for surgery.  I have reviewed the patient's chart and labs.  Questions were answered to the patient's satisfaction.     Taft Minerva

## 2024-03-04 NOTE — Evaluation (Signed)
 Physical Therapy Evaluation Patient Details Name: [name] MRN: [id number] DOB: [birth year] Today's Date: 03/04/2024  LEFT KNEE ROM: 101 degrees AMBULATION DISTANCE: 75 feet   History of Present Illness  [name] is 83 years old who presents for evaluation of ongoing left knee pain.  He says that he has had pain for several years now he has had several cortisone injections, hyaluronic acid injections and a week ago had another aspiration of about 40 cc of fluid followed by injection of cortisone for acute onset of knee pain with disabling pain swelling and inability to walk.  He has subsequently improved significantly after his aspiration injection and is here for evaluation for further treatment options     He says he still very active he likes to mow his lawn.  He is still doing other things that are important for him and he would like to proceed with knee replacement surgery if that is a better answer     He has a history of seizure disorder but has not had one in 21 years he had a recent checkup and has been doing well  [name]  has presented today for surgery, with the diagnosis of Osteoarthritis left knee.  The various methods of treatment have been discussed with the patient and family. After consideration of risks, benefits and other options for treatment, the patient has consented to  Procedure(s):  ARTHROPLASTY, KNEE, TOTAL (Left) as a surgical intervention.  The patient's history has been reviewed, patient examined, no change in status, stable for surgery.  I have reviewed the patient's chart and labs.  Questions were answered to the patient's satisfaction.   Clinical Impression  Patient demonstrates slow labored movement for sitting up at bedside but is able to self complete with increased time. Patient demonstrates increased difficulty and unsteadiness with sit to stand from a standard surface primarily due to weight bearing mechanics but is able to correct with minimal cueing.  Patient demonstrates fair return for ambulating in the room and hallway without LOB using RW. Patient will benefit from continued skilled physical therapy in hospital and recommended venue below to increase strength, balance, endurance for safe ADLs and gait.         If plan is discharge home, recommend the following: A little help with walking and/or transfers;Help with stairs or ramp for entrance;Assist for transportation;A little help with bathing/dressing/bathroom   Can travel by private vehicle        Equipment Recommendations None recommended by PT  Recommendations for Other Services       Functional Status Assessment Patient has had a recent decline in their functional status and demonstrates the ability to make significant improvements in function in a reasonable and predictable amount of time.     Precautions / Restrictions Precautions Precautions: Fall Recall of Precautions/Restrictions: Intact Restrictions Weight Bearing Restrictions Per Provider Order: Yes LLE Weight Bearing Per Provider Order: Weight bearing as tolerated      Mobility  Bed Mobility Overal bed mobility: Modified Independent             General bed mobility comments: increased time    Transfers Overall transfer level: Needs assistance Equipment used: Rolling walker (2 wheels) Transfers: Sit to/from Stand, Bed to chair/wheelchair/BSC Sit to Stand: Min assist   Step pivot transfers: Contact guard assist       General transfer comment: labored movement, increased time; initially unsteady, verbal cues for walker hand placement    Ambulation/Gait Ambulation/Gait assistance: Contact guard assist  Gait Distance (Feet): 75 Feet Assistive device: Rolling walker (2 wheels) Gait Pattern/deviations: Step-to pattern, Decreased step length - right, Decreased step length - left, Decreased stride length, Decreased stance time - left, Decreased weight shift to left Gait velocity: slow     General  Gait Details: heavy UE reliance on RW, fair return for ambulating in room and hallway without LOB  Stairs            Wheelchair Mobility     Tilt Bed    Modified Rankin (Stroke Patients Only)       Balance Overall balance assessment: Needs assistance   Sitting balance-Leahy Scale: Good Sitting balance - Comments: seated EOB   Standing balance support: During functional activity, Reliant on assistive device for balance, Bilateral upper extremity supported Standing balance-Leahy Scale: Fair Standing balance comment: using RW                             Pertinent Vitals/Pain Pain Assessment Pain Assessment: 0-10 Pain Score: 5  Pain Descriptors / Indicators: Discomfort Pain Intervention(s): Monitored during session, Repositioned    Home Living Family/patient expects to be discharged to:: Private residence Living Arrangements: Spouse/significant other Available Help at Discharge: Family;Available 24 hours/day Type of Home: House Home Access: Stairs to enter Entrance Stairs-Rails: None Entrance Stairs-Number of Steps: 1   Home Layout: One level Home Equipment: Agricultural consultant (2 wheels)      Prior Function Prior Level of Function : Independent/Modified Independent;Driving             Mobility Comments: Programmer, 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 ADLs Comments: Independent ADLs     Extremity/Trunk Assessment        Lower Extremity Assessment Lower Extremity Assessment: LLE deficits/detail LLE Deficits / Details: s/p Lt TKA LLE Sensation: WNL    Cervical / Trunk Assessment Cervical / Trunk Assessment: Normal  Communication   Communication Communication: No apparent difficulties    Cognition Arousal: Alert Behavior During Therapy: WFL for tasks assessed/performed   PT - Cognitive impairments: No apparent impairments                         Following commands: Intact       Cueing Cueing Techniques: Verbal cues,  Tactile cues     General Comments General comments (skin integrity, edema, etc.): bandage intact    Exercises General Exercises - Lower Extremity Ankle Circles/Pumps: 10 reps, AROM, Left, Supine Quad Sets: 10 reps, Left, Supine Short Arc Quad: 10 reps, AROM, Left, Supine Heel Slides: 10 reps, AROM, Supine, Left   Assessment/Plan    PT Assessment Patient needs continued PT services  PT Problem List Decreased strength;Decreased range of motion;Decreased activity tolerance;Decreased mobility;Decreased balance       PT Treatment Interventions DME instruction;Therapeutic activities;Gait training;Therapeutic exercise;Patient/family education;Stair training;Balance training;Functional mobility training    PT Goals (Current goals can be found in the Care Plan section)  Acute Rehab PT Goals Patient Stated Goal: return home PT Goal Formulation: With patient/family Time For Goal Achievement: 03/18/24 Potential to Achieve Goals: Good    Frequency Min 3X/week     Co-evaluation               AM-PAC PT 6 Clicks Mobility  Outcome Measure Help needed turning from your back to your side while in a flat bed without using bedrails?: None Help needed moving from lying on your back to sitting on the side  of a flat bed without using bedrails?: None Help needed moving to and from a bed to a chair (including a wheelchair)?: A Little Help needed standing up from a chair using your arms (e.g., wheelchair or bedside chair)?: A Little Help needed to walk in hospital room?: A Little Help needed climbing 3-5 steps with a railing? : A Lot 6 Click Score: 19    End of Session Equipment Utilized During Treatment: Gait belt Activity Tolerance: Patient tolerated treatment well Patient left: in chair;with family/visitor present;with call bell/phone within reach Nurse Communication: Mobility status PT Visit Diagnosis: Unsteadiness on feet (R26.81);Other abnormalities of gait and mobility  (R26.89);Muscle weakness (generalized) (M62.81)    Time: 1429-1500 PT Time Calculation (min) (ACUTE ONLY): 31 min   Charges:   PT Evaluation $PT Eval Moderate Complexity: 1 Mod PT Treatments $Therapeutic Activity: 23-37 mins PT General Charges $$ ACUTE PT VISIT: 1 Visit         3:29 PM, 03/04/24,  Onnie Como, SPT

## 2024-03-04 NOTE — Progress Notes (Signed)
 Patient has APAP 1000mg  and 500mg  along with hydrocodone -APAP 5/325mg  scheduled for 1800. Attempted to contact MD to see if he wanted all three given. Spoke with patient and he rated his pain a 4-5/10 at the time. He stated that he doesn't really take pain meds and has a high pain tolerance. He is now rating his pain a 1-2/10 and only wanted to take the APAP 500mg  tablet at this time.

## 2024-03-04 NOTE — Op Note (Signed)
 Dictation for total knee replacement  Orthopaedic Surgery Operative Note (CSN: [id number])  [name]  Jul 13, 1941 Date of Surgery: 03/04/2024   Diagnoses:  Osteoarthritis left knee  Procedure: Left tka    TXA [used/not used] used    Operative Finding Moderate varus deformity of the left knee, flexion contracture 10 degrees.  Preop flexion 100 degrees.  IntraOp grade 4 arthritis medial compartment tibia and femur multiple osteophytes all 3 compartments grade 4 arthritis of the patella  Medial meniscus was deficient.  Lateral meniscus was intact.  ACL and PCL were intact.  Lateral compartment grade 2 arthritis on the femur normal on the tibia  Bone cuts Distal femur -9  Proximal tibia -3 medial side reference  Patella -started with 25.5 cut down to 15  Post-Op Diagnosis: Same Surgeons:Primary: [name], MD Assistants: Levon Constant and Jon Backer Location: [facility] OR ROOM 4 Anesthesia: spinal Antibiotics: Ancef  2 g Tourniquet time: 72 minutes Estimated Blood Loss: 75 cc Complications: None Specimens: None   Implants: Implant Name Type Inv. Item Serial No. Manufacturer Lot No. LRB No. Used Action  CEMENT HV SMART SET - [id number] Cement CEMENT HV SMART SET  [employer] [id number] Left 1 Implanted  CEMENT HV SMART SET - [id number] Cement CEMENT HV SMART SET  [employer] [id number] Left 1 Implanted  ATTUNE MED DOME PAT 38 KNEE - [id number] Knees ATTUNE MED DOME PAT 38 KNEE  [employer] [id number] Left 1 Implanted  ATTUNE PS FEM LT SZ 8 CEM KNEE - [id number] Femur ATTUNE PS FEM LT SZ 8 CEM KNEE  [employer] [id number] Left 1 Implanted  BASEPLATE TIB CMT FB PCKT SZ 8 - [id number] Knees BASEPLATE TIB CMT FB PCKT SZ 8  [employer] [id number] Left 1 Implanted  ATTUNE TIBIAL INSERT FIXED BEARING POSTERIOR STABILIZED Knees   [employer] [id number] Left 1 Implanted     Indications for Surgery:   Disabling pain of the right knee which did  not improve despite nonoperative measures. The benefits and risks of operative and nonoperative management were discussed prior to surgery with patient/guardian(s) and informed consent form was completed.  While all risks cannot be anticipated, specific risks including infection, need for additional surgery, stiffness, postop pain, infection, implant removal, loosening, infection requiring amputation, deep vein thrombosis, pulmonary embolus were discussed.   Procedure:    Details of surgery: The patient was identified by 2 approved identification mechanisms. The operative extremity was evaluated and found to be acceptable for surgical treatment today. The chart was reviewed. The surgical site was confirmed and marked over the right knee   Saphenous nerve block was planned and completed in the PACU yes [Y/N]   The patient was taken to the operating room and given 2 g Ancef  for antibiotic.  This is consistent with the SCIP protocol.  The patient was given the following anesthetic: No anesthesia with postop nerve block in the PACU the patient was then placed supine on the operating table. A Foley catheter was inserted. The operative extremity was prepped and draped sterilely from the toes to the groin.  Timeout was executed confirming the patient's name, surgical site, antibiotic administration, x-rays available, and implants available.  The operative limb,  was exsanguinated with a six-inch Esmarch and the tourniquet was inflated to 280 mmHg.  A straight midline incision was made over the left KNEE and taken down to the extensor mechanism. A medial arthrotomy was performed. The patella was everted and the patellofemoral soft tissue was released, along with  the patellar fat pad.  The anterior cruciate ligament and PCL were resected. The anterior horns of the lateral and medial meniscus were resected. The medial soft tissue sleeve was elevated to the mid coronal plane.   A three-eighths inch  drill bit was used to enter the femoral canal which was decompressed with suction and irrigation until clear.  The distal femoral cutting guide was set for 11 mm distal resection,  5valgus alignment, for a left knee. The distal femur was resected and checked for flatness. The attune sizing femoral guide was placed and the femur was preliminarily sized to a size 7.5.    The external alignment guide for the tibial resection was then applied to the distal and proximal tibia and set for 3 degree posterior slope along with 3 MM resection  from the medial.  Rotational alignment was set using the malleolus, the tibial tubercle and the tibial spines. The proximal tibia was resected along with  residual menisci. The tibia was sized using a base plate to a size 8.    The extension gap was checked.  The spacer block which balanced extension gap was a size 6 The femur size was rechecked and measured a 7.5 but size 8 was chosen The femoral cutting block was placed.  The spacer block which balanced extension gap was placed under the 4-in-1 femoral cutting block.  An assessment was made at this point as to whether the block should be moved up or down or left in place.  It was left in place   The size 6 spacer block balance the flexion gap.  Rotation was assessed using Whitesides line and the epicondyles.  Once I was satisfied with the spacer block collateral ligament retractors were placed and I completed the 4 distal femoral cuts   The extension gap was rechecked with the size 6 spacer block.  The flexion and extension gaps were now balanced and I proceeded to cut the notch in the femur. The correct sized notch cutting guide for the femur was then applied and the notch cut was made.   Trial reduction was completed using size 8 femur 8 tibia 6 polyethylene trial implant. Patella tracking was normal  We then skeletonized the patella. It measured 25.5 in thickness and the patellar resection was set for  9.5 millimeters. the patellar resection was completed by hand. The patella diameter measured 38. We then drilled the peg holes for the patella.   The proximal tibia was prepared using the size 8 base plate.  Thorough irrigation was performed using saline  and the bone was dried and prepared for cement. The cement was mixed on the back table using third generation preparation techniques  The implants were then cemented in place and excess cement was removed. The cement was allowed to cure.  Normal saline irrigation followed by Surgipor irrigation followed by normal saline irrigation  Any excess bone fragments and cement were removed.  The tourniquet was released.  Any bleeders that were encountered were coagulated  Closure was done with the following sutures  #1 Braylon in interrupted fashion followed by 0 Monocryl interrupted and then 0 Monocryl running  Skin approximation was performed using staples  A sterile dressing was applied, TED hose were placed on the operative extremity followed by Cryo/Cuff.  The patient was taken recovery room in stable condition  Postop plan: Weightbearing as tolerated CPM machine Immediate physical therapy Discharge tomorrow if stable

## 2024-03-04 NOTE — Brief Op Note (Signed)
 03/04/2024  10:19 AM  PATIENT:  [name]  83 y.o. male  PRE-OPERATIVE DIAGNOSIS:  Osteoarthritis left knee  POST-OPERATIVE DIAGNOSIS:  Osteoarthritis left knee  PROCEDURE:  Procedure(s): ARTHROPLASTY, KNEE, TOTAL (Left)  SURGEON:  Surgeons and Role:    * [name], MD - Primary  PHYSICIAN ASSISTANT:   ASSISTANTS: nikki harley and angela whitt   ANESTHESIA:   spinal + pacu nerve block   EBL:  75 mL   BLOOD ADMINISTERED:none  DRAINS: none   LOCAL MEDICATIONS USED:  OTHER zinrelef  SPECIMEN:  No Specimen  DISPOSITION OF SPECIMEN:  N/A  COUNTS:  YES  TOURNIQUET:   Total Tourniquet Time Documented: Thigh (Left) - 72 minutes Total: Thigh (Left) - 72 minutes   DICTATION: .Nechama Dictation  PLAN OF CARE: Admit for overnight observation  PATIENT DISPOSITION:  PACU - hemodynamically stable.   Delay start of Pharmacological VTE agent (>24hrs) due to surgical blood loss or risk of bleeding: yes

## 2024-03-05 ENCOUNTER — Encounter (HOSPITAL_COMMUNITY): Payer: Self-pay | Admitting: Orthopedic Surgery

## 2024-03-05 DIAGNOSIS — M1712 Unilateral primary osteoarthritis, left knee: Secondary | ICD-10-CM | POA: Diagnosis not present

## 2024-03-05 DIAGNOSIS — F1090 Alcohol use, unspecified, uncomplicated: Secondary | ICD-10-CM | POA: Diagnosis not present

## 2024-03-05 DIAGNOSIS — Z7982 Long term (current) use of aspirin: Secondary | ICD-10-CM | POA: Diagnosis not present

## 2024-03-05 DIAGNOSIS — Z87891 Personal history of nicotine dependence: Secondary | ICD-10-CM | POA: Diagnosis not present

## 2024-03-05 LAB — BASIC METABOLIC PANEL WITH GFR
Anion gap: 5 (ref 5–15)
BUN: 14 mg/dL (ref 8–23)
CO2: 23 mmol/L (ref 22–32)
Calcium: 9.4 mg/dL (ref 8.9–10.3)
Chloride: 105 mmol/L (ref 98–111)
Creatinine, Ser: 1.05 mg/dL (ref 0.61–1.24)
GFR, Estimated: >60 mL/min (ref >60)
Glucose, Bld: 112 mg/dL — ABNORMAL HIGH (ref 70–99)
Potassium: 4 mmol/L (ref 3.5–5.1)
Sodium: 133 mmol/L — ABNORMAL LOW (ref 135–145)

## 2024-03-05 LAB — CBC
HCT: 30.2 % — ABNORMAL LOW (ref 39.0–52.0)
Hemoglobin: 10.4 g/dL — ABNORMAL LOW (ref 13.0–17.0)
MCH: 31.4 pg (ref 26.0–34.0)
MCHC: 34.4 g/dL (ref 30.0–36.0)
MCV: 91.2 fL (ref 80.0–100.0)
Platelets: 204 K/uL (ref 150–400)
RBC: 3.31 MIL/uL — ABNORMAL LOW (ref 4.22–5.81)
RDW: 12.9 % (ref 11.5–15.5)
WBC: 8.3 K/uL (ref 4.0–10.5)
nRBC: 0 % (ref 0.0–0.2)

## 2024-03-05 MED ORDER — METHOCARBAMOL 500 MG PO TABS: 500.0000 mg | ORAL_TABLET | Freq: Four times a day (QID) | ORAL | 2 refills | Status: AC | PRN

## 2024-03-05 MED ORDER — ACETAMINOPHEN 500 MG PO TABS: 500.0000 mg | ORAL_TABLET | Freq: Four times a day (QID) | ORAL | 0 refills | Status: AC

## 2024-03-05 MED ORDER — NAPROXEN SODIUM 220 MG PO TABS: 220.0000 mg | ORAL_TABLET | Freq: Two times a day (BID) | ORAL | 1 refills | Status: AC

## 2024-03-05 MED ORDER — ASPIRIN 325 MG PO TBEC: 325.0000 mg | DELAYED_RELEASE_TABLET | Freq: Every day | ORAL | 3 refills | Status: AC

## 2024-03-05 MED ORDER — HYDROCODONE-ACETAMINOPHEN 7.5-325 MG PO TABS: 1.0000 | ORAL_TABLET | ORAL | 0 refills | Status: DC | PRN

## 2024-03-05 MED ORDER — POLYETHYLENE GLYCOL 3350 17 G PO PACK: 17.0000 g | PACK | Freq: Every day | ORAL | 0 refills | Status: AC | PRN

## 2024-03-05 MED ORDER — BISACODYL 5 MG PO TBEC: 5.0000 mg | DELAYED_RELEASE_TABLET | Freq: Every day | ORAL | 0 refills | Status: AC | PRN

## 2024-03-05 NOTE — TOC Initial Note (Addendum)
 Transition of Care [facility]) - Initial/Assessment Note/Discharge Note    Patient Details  Name: [name] MRN: [id number] Date of Birth: [birth year]  Transition of Care [facility]) CM/SW Contact:    Noreen KATHEE Pinal, LCSWA Phone Number: 03/05/2024, 10:20 AM  Clinical Narrative:                  CSW spoke with patient and spouse at bedside. CSW reviewed and confirmed patient recommendation for [facility]. Patient is agreeable . Delon with [facility], message CSW yesterday regarding this patient for [facility] services. Patient reports that he is independent at home and has a walker, WC, and potty chair. Spouse is supportive; Patient reports to CSW that he will have home health for now and then on August 25th he has a scheduled appointment with outpatient rehab in [place], [place]. [place] with [facility] made aware.TOC signing off.    Expected Discharge Plan: Home w Home Health Services Barriers to Discharge: Continued Medical Work up   Patient Goals and CMS Choice Patient states their goals for this hospitalization and ongoing recovery are:: return back home CMS Medicare.gov Compare Post Acute Care list provided to:: Patient Choice offered to / list presented to : Patient      Expected Discharge Plan and Services In-house Referral: Clinical Social Work   Post Acute Care Choice: Durable Medical Equipment Living arrangements for the past 2 months: Single Family Home                           HH Arranged: PT HH Agency: [facility] Date [facility] Agency Contacted: 03/05/24 Time HH Agency Contacted: 1018 Representative spoke with at [facility] Agency: Delon  Prior Living Arrangements/Services Living arrangements for the past 2 months: Single Family Home Lives with:: Spouse Patient language and need for interpreter reviewed:: Yes Do you feel safe going back to the place where you live?: Yes      Need for Family Participation in Patient Care: Yes (Comment) Care giver support system in place?: Yes  (comment) Current home services: DME Criminal Activity/Legal Involvement Pertinent to Current Situation/Hospitalization: No - Comment as needed  Activities of Daily Living   ADL Screening (condition at time of admission) Independently performs ADLs?: Yes (appropriate for developmental age) Is the patient deaf or have difficulty hearing?: Yes Does the patient have difficulty seeing, even when wearing glasses/contacts?: No Does the patient have difficulty concentrating, remembering, or making decisions?: No  Permission Sought/Granted      Share Information with NAME: Jakori     Permission granted to share info w Relationship: Patient     Emotional Assessment Appearance:: Appears stated age Attitude/Demeanor/Rapport: Self-Confident, Engaged Affect (typically observed): Appropriate, Accepting Orientation: : Oriented to Self, Oriented to Place, Oriented to  Time, Oriented to Situation Alcohol  / Substance Use: Not Applicable Psych Involvement: No (comment)  Admission diagnosis:  Osteoarthritis of left knee [M17.12] Patient Active Problem List   Diagnosis Date Noted   Primary osteoarthritis of left knee 03/04/2024   Osteoarthritis of left knee 03/04/2024   Prostate nodule 11/13/2023   Family history of colon cancer 07/11/2022   History of colonic polyps 07/11/2022   Seizure (HCC) 06/13/2021   Nephrolithiasis 06/13/2021   Hypothyroidism 06/13/2021   Sciatica 06/13/2021   Benign prostatic hyperplasia with urinary obstruction 02/04/2021   Neuropathy, lower extremity 02/03/2019   Generalized idiopathic epilepsy and epileptic syndromes, not intractable, without status epilepticus (HCC) 08/18/2013   Rosacea 08/18/2013   PCP:  Lari Standing  E, MD Pharmacy:   [facility] [phone] - [address] [place] - [address] AT [facility] [address] [place] [address] Phone: [phone] Fax: [phone]     Social Drivers of Health (SDOH) Social  History: SDOH Screenings   Food Insecurity: Patient Declined (03/04/2024)  Housing: Patient Declined (03/04/2024)  Transportation Needs: Patient Declined (03/04/2024)  Utilities: Patient Declined (03/04/2024)  Social Connections: Patient Declined (03/04/2024)  Tobacco Use: Medium Risk (03/04/2024)   SDOH Interventions:     Readmission Risk Interventions     No data to display

## 2024-03-05 NOTE — Progress Notes (Signed)
 Subjective: 1 Day Post-Op Procedure(s) (LRB): ARTHROPLASTY, KNEE, TOTAL (Left) Patient reports pain as 0-3.    Objective: Vital signs in last 24 hours: Temp:  [97.5 F (36.4 C)-98.2 F (36.8 C)] 98.2 F (36.8 C) (08/13 0602) Pulse Rate:  [48-67] 59 (08/13 0602) Resp:  [8-20] 20 (08/13 0602) BP: (123-156)/(64-91) 152/84 (08/13 0602) SpO2:  [90 %-100 %] 99 % (08/13 0602) Weight:  [74.6 kg] 74.6 kg (08/12 1209)  Intake/Output from previous day: 08/12 0701 - 08/13 0700 In: 2420 [P.O.:120; I.V.:2100; IV Piggyback:200] Out: 4795 [Urine:4720; Blood:75] Intake/Output this shift: No intake/output data recorded.  Recent Labs    03/04/24 1227 03/05/24 0429  HGB 11.0* 10.4*   Recent Labs    03/04/24 1227 03/05/24 0429  WBC 6.9 8.3  RBC 3.60* 3.31*  HCT 33.9* 30.2*  PLT 180 204   Recent Labs    03/04/24 1227 03/05/24 0429  NA  --  133*  K  --  4.0  CL  --  105  CO2  --  23  BUN  --  14  CREATININE 1.00 1.05  GLUCOSE  --  112*  CALCIUM  --  9.4   No results for input(s): LABPT, INR in the last 72 hours.  Neurologically intact Neurovascular intact Sensation intact distally Intact pulses distally Dorsiflexion/Plantar flexion intact Incision: scant drainage   Assessment/Plan: 1 Day Post-Op Procedure(s) (LRB): ARTHROPLASTY, KNEE, TOTAL (Left) Advance diet Up with therapy D/C IV fluids Discharge home with home health   Anticipated LOS equal to or greater than 2 midnights due to - Age 5 and older with one or more of the following:  - Obesity  - Expected need for hospital services (PT, OT, Nursing) required for safe  discharge  - Anticipated need for postoperative skilled nursing care or inpatient rehab  - Active co-morbidities: None OR   - Unanticipated findings during/Post Surgery: None  - Patient is a high risk of re-admission due to: None   [name] 03/05/2024, 8:38 AM

## 2024-03-05 NOTE — Plan of Care (Signed)
  Problem: Education: Goal: Knowledge of General Education information will improve Description: Including pain rating scale, medication(s)/side effects and non-pharmacologic comfort measures Outcome: Adequate for Discharge   Problem: Health Behavior/Discharge Planning: Goal: Ability to manage health-related needs will improve Outcome: Adequate for Discharge   Problem: Clinical Measurements: Goal: Ability to maintain clinical measurements within normal limits will improve Outcome: Adequate for Discharge Goal: Will remain free from infection Outcome: Adequate for Discharge Goal: Diagnostic test results will improve Outcome: Adequate for Discharge Goal: Respiratory complications will improve Outcome: Adequate for Discharge Goal: Cardiovascular complication will be avoided Outcome: Adequate for Discharge   Problem: Activity: Goal: Risk for activity intolerance will decrease Outcome: Adequate for Discharge   Problem: Nutrition: Goal: Adequate nutrition will be maintained Outcome: Adequate for Discharge   Problem: Coping: Goal: Level of anxiety will decrease Outcome: Adequate for Discharge   Problem: Elimination: Goal: Will not experience complications related to bowel motility Outcome: Adequate for Discharge Goal: Will not experience complications related to urinary retention Outcome: Adequate for Discharge   Problem: Pain Managment: Goal: General experience of comfort will improve and/or be controlled Outcome: Adequate for Discharge   Problem: Safety: Goal: Ability to remain free from injury will improve Outcome: Adequate for Discharge   Problem: Skin Integrity: Goal: Risk for impaired skin integrity will decrease Outcome: Adequate for Discharge   Problem: Education: Goal: Knowledge of the prescribed therapeutic regimen will improve Outcome: Adequate for Discharge Goal: Individualized Educational Video(s) Outcome: Adequate for Discharge   Problem:  Activity: Goal: Ability to avoid complications of mobility impairment will improve Outcome: Adequate for Discharge Goal: Range of joint motion will improve Outcome: Adequate for Discharge   Problem: Clinical Measurements: Goal: Postoperative complications will be avoided or minimized Outcome: Adequate for Discharge   Problem: Pain Management: Goal: Pain level will decrease with appropriate interventions Outcome: Adequate for Discharge   Problem: Skin Integrity: Goal: Will show signs of wound healing Outcome: Adequate for Discharge   Problem: Acute Rehab PT Goals(only PT should resolve) Goal: Patient Will Transfer Sit To/From Stand Outcome: Adequate for Discharge Goal: Pt Will Transfer Bed To Chair/Chair To Bed Outcome: Adequate for Discharge Goal: Pt Will Ambulate Outcome: Adequate for Discharge Goal: Pt Will Go Up/Down Stairs Outcome: Adequate for Discharge

## 2024-03-05 NOTE — Discharge Summary (Signed)
 Physician Discharge Summary  Patient ID: [name] MRN: [id number] DOB/AGE: [birth year] 81 y.o.  Admit date: 03/04/2024 Discharge date: 03/05/2024  Admission Diagnoses: left Knee osteoarthritis  Discharge Diagnoses: leftKnee osteoarthritis  Discharged Condition: Stable  Procedure: left knee arthroplasty  Hospital Course:  Hospital day 1 uncomplicated total knee arthroplasty left knee -Participated in physical therapy was able to ambulate  >100 FT with 101 range of motion  Hospital day 2 the patient continued to do well remained afebrile with stable vital signs -Advance in physical therapy tolerating the stairs with good pain control Walk 150 feet ROM 92 degrees flexion extension not recorded  Implant Name Type Inv. Item Serial No. Manufacturer Lot No. LRB No. Used Action  CEMENT HV SMART SET - [id number] Cement CEMENT HV SMART SET  [employer] [id number] Left 1 Implanted  CEMENT HV SMART SET - [id number] Cement CEMENT HV SMART SET  [employer] [id number] Left 1 Implanted  ATTUNE MED DOME PAT 38 KNEE - [id number] Knees ATTUNE MED DOME PAT 38 KNEE  [employer] [id number] Left 1 Implanted  ATTUNE PS FEM LT SZ 8 CEM KNEE - [id number] Femur ATTUNE PS FEM LT SZ 8 CEM KNEE  [employer] [id number] Left 1 Implanted  BASEPLATE TIB CMT FB PCKT SZ 8 - [id number] Knees BASEPLATE TIB CMT FB PCKT SZ 8  [employer] [id number] Left 1 Implanted  INSERT TIB ATTUNE FB SZ8X6 - [id number] Insert INSERT TIB ATTUNE FB SZ8X6  [employer] [id number] Left 1 Implanted    Lab reports:    Latest Ref Rng & Units 03/05/2024    4:29 AM 03/04/2024   12:27 PM 02/26/2024    2:02 PM  CBC  WBC 4.0 - 10.5 K/uL 8.3  6.9  5.1   Hemoglobin 13.0 - 17.0 g/dL 89.5  88.9  87.3   Hematocrit 39.0 - 52.0 % 30.2  33.9  37.9   Platelets 150 - 400 K/uL 204  180  181        Latest Ref Rng & Units 03/05/2024    4:29 AM 03/04/2024   12:27 PM 02/26/2024    2:02 PM  BMP  Glucose 70 - 99 mg/dL 887    85   BUN 8 - 23 mg/dL 14   20   Creatinine 9.38 - 1.24 mg/dL 8.94  8.99  8.85   Sodium 135 - 145 mmol/L 133   136   Potassium 3.5 - 5.1 mmol/L 4.0   4.4   Chloride 98 - 111 mmol/L 105   107   CO2 22 - 32 mmol/L 23   20   Calcium 8.9 - 10.3 mg/dL 9.4   9.4       Discharge Exam: BP (!) 152/84 (BP Location: Left Arm)   Pulse (!) 59   Temp 98.2 F (36.8 C) (Oral)   Resp 20   Ht 5' 7.5 (1.715 m)   Wt 164 lb 7.4 oz (74.6 kg)   SpO2 99%   BMI 25.38 kg/m  Physical Exam Vitals and nursing note reviewed.  Skin:    General: Skin is warm.  Neurological:     Mental Status: He is alert and oriented to person, place, and time.     Gait: Gait abnormal.  Psychiatric:        Mood and Affect: Mood normal.        Behavior: Behavior normal.        Thought Content: Thought content normal.        Judgment: Judgment  normal.      Disposition: Discharge disposition: 01-Home or Self Care       Discharge Instructions     Call MD / Call 911   Complete by: As directed    If you experience chest pain or shortness of breath, CALL 911 and be transported to the hospital emergency room.  If you develope a fever above 101 F, pus (white drainage) or increased drainage or redness at the wound, or calf pain, call your surgeon's office.   Constipation Prevention   Complete by: As directed    Drink plenty of fluids.  Prune juice may be helpful.  You may use a stool softener, such as Colace (over the counter) 100 mg twice a day.  Use MiraLax  (over the counter) for constipation as needed.   Diet - low sodium heart healthy   Complete by: As directed    Discharge instructions   Complete by: As directed    Dressing : do not change; if the middle turns black call the office for a new dressing   Blue foam pillow: 30 min 3 x a day   CPM: 6 hrs per day; start at 0-90 increase 10 per day as needed   Exercises: as instructed by PT  Pain:  1. Use the ice pad as much as possible to help with pain and  swelling, 30 min every 2 hrs  2. Tylenol  [id number] mg every 6 hrs  3. Naproxen  twice a day  4. Hydrocodone  every 4 hrs as needed   Do not put a pillow under the knee. Place it under the heel.   Complete by: As directed    Driving restrictions   Complete by: As directed    No driving for 2 weeks   Increase activity slowly as tolerated   Complete by: As directed    Post-operative opioid taper instructions:   Complete by: As directed    POST-OPERATIVE OPIOID TAPER INSTRUCTIONS: It is important to wean off of your opioid medication as soon as possible. If you do not need pain medication after your surgery it is ok to stop day one. Opioids include: Codeine, Hydrocodone (Norco, Vicodin), Oxycodone (Percocet, oxycontin ) and hydromorphone  amongst others.  Long term and even short term use of opiods can cause: Increased pain response Dependence Constipation Depression Respiratory depression And more.  Withdrawal symptoms can include Flu like symptoms Nausea, vomiting And more Techniques to manage these symptoms Hydrate well Eat regular healthy meals Stay active Use relaxation techniques(deep breathing, meditating, yoga) Do Not substitute Alcohol  to help with tapering If you have been on opioids for less than two weeks and do not have pain than it is ok to stop all together.  Plan to wean off of opioids This plan should start within one week post op of your joint replacement. Maintain the same interval or time between taking each dose and first decrease the dose.  Cut the total daily intake of opioids by one tablet each day Next start to increase the time between doses. The last dose that should be eliminated is the evening dose.      TED hose   Complete by: As directed    Use stockings (TED hose) for 4 weeks on both leg(s).  You may remove them at night for sleeping.      Allergies as of 03/05/2024       Reactions   Sulfa  Antibiotics Hives, Rash        Medication List      TAKE  these medications    acetaminophen  500 MG tablet Commonly known as: TYLENOL  Take 1 tablet (500 mg total) by mouth every 6 (six) hours.   alfuzosin  10 MG 24 hr tablet Commonly known as: UROXATRAL  Take 1 tablet (10 mg total) by mouth daily with breakfast.   aspirin  EC 325 MG tablet Take 1 tablet (325 mg total) by mouth daily.   bisacodyl  5 MG EC tablet Commonly known as: DULCOLAX Take 1 tablet (5 mg total) by mouth daily as needed for moderate constipation.   finasteride  5 MG tablet Commonly known as: PROSCAR  Take 1 tablet (5 mg total) by mouth daily.   HYDROcodone -acetaminophen  7.5-325 MG tablet Commonly known as: NORCO Take 1-2 tablets by mouth every 4 (four) hours as needed for moderate pain (pain score 4-6) (pain score 7-10).   levothyroxine  75 MCG tablet Commonly known as: SYNTHROID  Take 75 mcg by mouth daily before breakfast.   methocarbamol  500 MG tablet Commonly known as: ROBAXIN  Take 1 tablet (500 mg total) by mouth every 6 (six) hours as needed for muscle spasms.   metroNIDAZOLE  1 % gel Commonly known as: METROGEL  Apply 1 Application topically at bedtime.   multivitamin with minerals Tabs tablet Take 1 tablet by mouth daily.   naproxen  sodium 220 MG tablet Commonly known as: ALEVE  Take 1 tablet (220 mg total) by mouth 2 (two) times daily with a meal. What changed:  when to take this reasons to take this   Oxcarbazepine  300 MG tablet Commonly known as: TRILEPTAL  Take 300 mg by mouth 2 (two) times daily.   polyethylene glycol 17 g packet Commonly known as: MIRALAX  / GLYCOLAX  Take 17 g by mouth daily as needed for mild constipation.   sodium chloride  0.65 % nasal spray Commonly known as: OCEAN Place 1 spray into the nose daily as needed for congestion.   zonisamide  100 MG capsule Commonly known as: ZONEGRAN  Take 400 mg by mouth in the morning.        Follow-up Information     [facility] Follow up.   Specialty: Home Health  Services Why: Home Health will call to schedule first home visit. Contact information: [address] [place] [address] [phone]                 Signed: Taft Minerva 03/05/2024, 12:32 PM

## 2024-03-05 NOTE — Care Management Obs Status (Signed)
 MEDICARE OBSERVATION STATUS NOTIFICATION   Patient Details  Name: [name] MRN: [id number] Date of Birth: 1941/06/29   Medicare Observation Status Notification Given:  Yes    [name] 03/05/2024, 11:14 AM

## 2024-03-05 NOTE — Progress Notes (Signed)
 Physical Therapy Treatment Patient Details Name: [name] MRN: [id number] DOB: Apr 19, 1941 Today's Date: 03/05/2024  RIGHT/LEFT KNEE ROM: 92 degrees AMBULATION DISTANCE: 150 feet   History of Present Illness [name] is 83 years old who presents for evaluation of ongoing left knee pain.  He says that he has had pain for several years now he has had several cortisone injections, hyaluronic acid injections and a week ago had another aspiration of about 40 cc of fluid followed by injection of cortisone for acute onset of knee pain with disabling pain swelling and inability to walk.  He has subsequently improved significantly after his aspiration injection and is here for evaluation for further treatment options     He says he still very active he likes to mow his lawn.  He is still doing other things that are important for him and he would like to proceed with knee replacement surgery if that is a better answer     He has a history of seizure disorder but has not had one in 21 years he had a recent checkup and has been doing well  [name]  has presented today for surgery, with the diagnosis of Osteoarthritis left knee.  The various methods of treatment have been discussed with the patient and family. After consideration of risks, benefits and other options for treatment, the patient has consented to  Procedure(s):  ARTHROPLASTY, KNEE, TOTAL (Left) as a surgical intervention.  The patient's history has been reviewed, patient examined, no change in status, stable for surgery.  I have reviewed the patient's chart and labs.  Questions were answered to the patient's satisfaction.    PT Comments  Patient pleasant and agreeable to PT session. Patient knee flexion slightly less than inital eval but likely combination of morning stiffness and decreased effects of analgesic medication. Patient demonstrates modI with bed mobility but requires supervision for transfers and gait at this stage in rehab for  increased safety. Patient presents with mild balance deficits but is able to navigate functional tasks with little minimal assistance. Patient able to successfully negotiate 5 steps without increased pain demonstrating proper mechanics throughout movement. PLAN:  Patient to be discharged home today and discharged from acute physical therapy with recommendations stated below.     If plan is discharge home, recommend the following: A little help with walking and/or transfers;Help with stairs or ramp for entrance;Assist for transportation;A little help with bathing/dressing/bathroom   Can travel by private vehicle        Equipment Recommendations  None recommended by PT    Recommendations for Other Services       Precautions / Restrictions Precautions Precautions: Fall Recall of Precautions/Restrictions: Intact Restrictions Weight Bearing Restrictions Per Provider Order: Yes LLE Weight Bearing Per Provider Order: Weight bearing as tolerated     Mobility  Bed Mobility Overal bed mobility: Modified Independent             General bed mobility comments: increased time    Transfers Overall transfer level: Needs assistance Equipment used: Rolling walker (2 wheels) Transfers: Sit to/from Stand, Bed to chair/wheelchair/BSC Sit to Stand: Contact guard assist   Step pivot transfers: Supervision       General transfer comment: increased times, x2 reps STS, verbal cues for initiation using nonoperative limb, slightly unsteady    Ambulation/Gait Ambulation/Gait assistance: Supervision Gait Distance (Feet): 150 Feet Assistive device: Rolling walker (2 wheels) Gait Pattern/deviations: Step-to pattern, Decreased step length - right, Decreased step length - left, Decreased  stride length, Decreased stance time - left, Decreased weight shift to left Gait velocity: slow     General Gait Details: good return for ambulating in room and hallway without LOB   Stairs Stairs:  Yes Stairs assistance: Contact guard assist Stair Management: One rail Right Number of Stairs: 5 General stair comments: fair return for stair negotiation, demonstrated proper mechanics using step to pattern   Wheelchair Mobility     Tilt Bed    Modified Rankin (Stroke Patients Only)       Balance Overall balance assessment: Needs assistance   Sitting balance-Leahy Scale: Good Sitting balance - Comments: received in recliner   Standing balance support: During functional activity, Reliant on assistive device for balance, Bilateral upper extremity supported Standing balance-Leahy Scale: Fair Standing balance comment: fair/good using RW                            Communication Communication Communication: No apparent difficulties  Cognition Arousal: Alert Behavior During Therapy: WFL for tasks assessed/performed   PT - Cognitive impairments: No apparent impairments                         Following commands: Intact      Cueing Cueing Techniques: Verbal cues, Tactile cues  Exercises      General Comments General comments (skin integrity, edema, etc.): Lt knee bandage intact      Pertinent Vitals/Pain Pain Assessment Pain Assessment: 0-10 Pain Score: 3  Pain Location: Lt knee Pain Descriptors / Indicators: Sore Pain Intervention(s): Monitored during session, Repositioned    Home Living                          Prior Function            PT Goals (current goals can now be found in the care plan section) Acute Rehab PT Goals Patient Stated Goal: return home PT Goal Formulation: With patient/family Time For Goal Achievement: 03/18/24 Potential to Achieve Goals: Good Progress towards PT goals: Progressing toward goals    Frequency    Min 3X/week      PT Plan      Co-evaluation              AM-PAC PT 6 Clicks Mobility   Outcome Measure  Help needed turning from your back to your side while in a flat bed  without using bedrails?: None Help needed moving from lying on your back to sitting on the side of a flat bed without using bedrails?: None Help needed moving to and from a bed to a chair (including a wheelchair)?: A Little Help needed standing up from a chair using your arms (e.g., wheelchair or bedside chair)?: A Little Help needed to walk in hospital room?: A Little Help needed climbing 3-5 steps with a railing? : A Lot 6 Click Score: 19    End of Session Equipment Utilized During Treatment: Gait belt Activity Tolerance: Patient tolerated treatment well Patient left: in chair;with family/visitor present;with call bell/phone within reach;with nursing/sitter in room Nurse Communication: Mobility status PT Visit Diagnosis: Unsteadiness on feet (R26.81);Other abnormalities of gait and mobility (R26.89);Muscle weakness (generalized) (M62.81)     Time: 9163-9141 PT Time Calculation (min) (ACUTE ONLY): 22 min  Charges:    $Therapeutic Activity: 8-22 mins PT General Charges $$ ACUTE PT VISIT: 1 Visit  12:27 PM, 03/05/24,  Gianluca Chhim, SPT

## 2024-03-06 DIAGNOSIS — Z9089 Acquired absence of other organs: Secondary | ICD-10-CM | POA: Diagnosis not present

## 2024-03-06 DIAGNOSIS — G40909 Epilepsy, unspecified, not intractable, without status epilepticus: Secondary | ICD-10-CM | POA: Diagnosis not present

## 2024-03-06 DIAGNOSIS — Z471 Aftercare following joint replacement surgery: Secondary | ICD-10-CM | POA: Diagnosis not present

## 2024-03-06 DIAGNOSIS — R32 Unspecified urinary incontinence: Secondary | ICD-10-CM | POA: Diagnosis not present

## 2024-03-06 DIAGNOSIS — Z9049 Acquired absence of other specified parts of digestive tract: Secondary | ICD-10-CM | POA: Diagnosis not present

## 2024-03-06 DIAGNOSIS — Z96652 Presence of left artificial knee joint: Secondary | ICD-10-CM | POA: Diagnosis not present

## 2024-03-06 DIAGNOSIS — Z87891 Personal history of nicotine dependence: Secondary | ICD-10-CM | POA: Diagnosis not present

## 2024-03-06 DIAGNOSIS — Z7982 Long term (current) use of aspirin: Secondary | ICD-10-CM | POA: Diagnosis not present

## 2024-03-06 DIAGNOSIS — Z791 Long term (current) use of non-steroidal anti-inflammatories (NSAID): Secondary | ICD-10-CM | POA: Diagnosis not present

## 2024-03-08 DIAGNOSIS — Z9049 Acquired absence of other specified parts of digestive tract: Secondary | ICD-10-CM | POA: Diagnosis not present

## 2024-03-08 DIAGNOSIS — Z96652 Presence of left artificial knee joint: Secondary | ICD-10-CM | POA: Diagnosis not present

## 2024-03-08 DIAGNOSIS — R32 Unspecified urinary incontinence: Secondary | ICD-10-CM | POA: Diagnosis not present

## 2024-03-08 DIAGNOSIS — Z471 Aftercare following joint replacement surgery: Secondary | ICD-10-CM | POA: Diagnosis not present

## 2024-03-08 DIAGNOSIS — Z9089 Acquired absence of other organs: Secondary | ICD-10-CM | POA: Diagnosis not present

## 2024-03-08 DIAGNOSIS — G40909 Epilepsy, unspecified, not intractable, without status epilepticus: Secondary | ICD-10-CM | POA: Diagnosis not present

## 2024-03-08 LAB — TYPE AND SCREEN
ABO/RH(D): A POS
Antibody Screen: NEGATIVE
Unit division: 0
Unit division: 0

## 2024-03-08 LAB — BPAM RBC
Blood Product Expiration Date: 202509062359
Blood Product Expiration Date: 202509072359
Unit Type and Rh: 6200
Unit Type and Rh: 6200

## 2024-03-10 DIAGNOSIS — G40909 Epilepsy, unspecified, not intractable, without status epilepticus: Secondary | ICD-10-CM | POA: Diagnosis not present

## 2024-03-10 DIAGNOSIS — Z9049 Acquired absence of other specified parts of digestive tract: Secondary | ICD-10-CM | POA: Diagnosis not present

## 2024-03-10 DIAGNOSIS — Z471 Aftercare following joint replacement surgery: Secondary | ICD-10-CM | POA: Diagnosis not present

## 2024-03-10 DIAGNOSIS — Z96652 Presence of left artificial knee joint: Secondary | ICD-10-CM | POA: Diagnosis not present

## 2024-03-10 DIAGNOSIS — R32 Unspecified urinary incontinence: Secondary | ICD-10-CM | POA: Diagnosis not present

## 2024-03-10 DIAGNOSIS — Z9089 Acquired absence of other organs: Secondary | ICD-10-CM | POA: Diagnosis not present

## 2024-03-12 DIAGNOSIS — Z471 Aftercare following joint replacement surgery: Secondary | ICD-10-CM | POA: Diagnosis not present

## 2024-03-12 DIAGNOSIS — Z9049 Acquired absence of other specified parts of digestive tract: Secondary | ICD-10-CM | POA: Diagnosis not present

## 2024-03-12 DIAGNOSIS — Z9089 Acquired absence of other organs: Secondary | ICD-10-CM | POA: Diagnosis not present

## 2024-03-12 DIAGNOSIS — G40909 Epilepsy, unspecified, not intractable, without status epilepticus: Secondary | ICD-10-CM | POA: Diagnosis not present

## 2024-03-12 DIAGNOSIS — R32 Unspecified urinary incontinence: Secondary | ICD-10-CM | POA: Diagnosis not present

## 2024-03-12 DIAGNOSIS — Z96652 Presence of left artificial knee joint: Secondary | ICD-10-CM | POA: Diagnosis not present

## 2024-03-14 DIAGNOSIS — R32 Unspecified urinary incontinence: Secondary | ICD-10-CM | POA: Diagnosis not present

## 2024-03-14 DIAGNOSIS — Z96652 Presence of left artificial knee joint: Secondary | ICD-10-CM | POA: Diagnosis not present

## 2024-03-14 DIAGNOSIS — Z471 Aftercare following joint replacement surgery: Secondary | ICD-10-CM | POA: Diagnosis not present

## 2024-03-14 DIAGNOSIS — Z9049 Acquired absence of other specified parts of digestive tract: Secondary | ICD-10-CM | POA: Diagnosis not present

## 2024-03-14 DIAGNOSIS — G40909 Epilepsy, unspecified, not intractable, without status epilepticus: Secondary | ICD-10-CM | POA: Diagnosis not present

## 2024-03-14 DIAGNOSIS — Z9089 Acquired absence of other organs: Secondary | ICD-10-CM | POA: Diagnosis not present

## 2024-03-14 NOTE — Therapy (Signed)
 OUTPATIENT PHYSICAL THERAPY LOWER EXTREMITY EVALUATION   Patient Name: [name] MRN: [id number] DOB:09/21/1940, 83 y.o., male Today's Date: 03/17/2024  END OF SESSION:  PT End of Session - 03/17/24 0756     Visit Number 1    Number of Visits 16    Date for PT Re-Evaluation 05/12/24    Authorization Type Medicare    Progress Note Due on Visit 10    PT Start Time 0800    PT Stop Time 0840    PT Time Calculation (min) 40 min    Activity Tolerance Patient tolerated treatment well    Behavior During Therapy [facility] for tasks assessed/performed          Past Medical History:  Diagnosis Date   Arthritis    History of colon polyps    removed with colonoscopy   History of kidney stones    Hypothyroidism    Seizures (HCC)    none in 20 yrs- controlled with meds(Dr. 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   Past Surgical History:  Procedure Laterality Date   CHOLECYSTECTOMY  07/25/2012   Procedure: LAPAROSCOPIC CHOLECYSTECTOMY WITH INTRAOPERATIVE CHOLANGIOGRAM;  Surgeon: Lynda Leos, MD;  Location: [facility];  Service: General;;   COLONOSCOPY  11/15/2011   Procedure: COLONOSCOPY;  Surgeon: Claudis RAYMOND Rivet, MD;  Location: [facility];  Service: Endoscopy;  Laterality: N/A;  730   COLONOSCOPY WITH PROPOFOL  N/A 07/11/2022   Procedure: COLONOSCOPY WITH PROPOFOL ;  Surgeon: Eartha Angelia Sieving, MD;  Location: [facility];  Service: Gastroenterology;  Laterality: N/A;  730 ASA 3   CYSTOSCOPY WITH LITHOLAPAXY  08/09/2023   Procedure: CYSTOSCOPY WITH LITHOLAPAXY;  Surgeon: Sherrilee Belvie CROME, MD;  Location: [facility];  Service: Urology;;   ELBOW SURGERY     chipped bone right elbow   HOLMIUM LASER APPLICATION  08/09/2023   Procedure: HOLMIUM LASER APPLICATION;  Surgeon: Sherrilee Belvie CROME, MD;  Location: [facility];  Service: Urology;;   POLYPECTOMY  07/11/2022   Procedure: POLYPECTOMY INTESTINAL;  Surgeon: Eartha Angelia Sieving, MD;  Location: [facility];  Service: Gastroenterology;;   TONSILLECTOMY     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   Procedure: ARTHROPLASTY, KNEE, TOTAL;  Surgeon: [name], MD;  Location: [facility];  Service: Orthopedics;  Laterality: Left;   TRANSURETHRAL RESECTION OF PROSTATE N/A 08/09/2023   Procedure: TRANSURETHRAL RESECTION OF THE PROSTATE (TURP);  Surgeon: Sherrilee Belvie CROME, MD;  Location: [facility];  Service: Urology;  Laterality: N/A;   VASECTOMY     Patient Active Problem List   Diagnosis Date Noted   Primary osteoarthritis of left knee 03/04/2024   Osteoarthritis of left knee 03/04/2024   Prostate nodule 11/13/2023   Family history of colon cancer 07/11/2022   History of colonic polyps 07/11/2022   Seizure (HCC) 06/13/2021   Nephrolithiasis 06/13/2021   Hypothyroidism 06/13/2021   Sciatica 06/13/2021   Benign prostatic hyperplasia with urinary obstruction 02/04/2021   Neuropathy, lower extremity 02/03/2019   Generalized idiopathic epilepsy and epileptic syndromes, not intractable, without status epilepticus (HCC) 08/18/2013   Rosacea 08/18/2013    PCP: [name], MD  REFERRING PROVIDER: [name], MD  REFERRING DIAG: [phone] (ICD-10-CM) - Unilateral primary osteoarthritis, left knee  THERAPY DIAG:  Stiffness of left knee, not elsewhere classified  Other abnormalities of gait and mobility  Muscle weakness (generalized)  Acute pain of left knee  Rationale for Evaluation and  Treatment: Rehabilitation  ONSET DATE: 03/04/24 L TKA  SUBJECTIVE:   SUBJECTIVE STATEMENT: Pt states he is almost 2 weeks post op. Did have some HHPT and was d/c-ed last Friday. Has been compliant with HEP. Pt reports pain to be well managed. Has been using cane some in the house. Uses CPM 6 hours/day -- will be done with it by next Friday. Pt and wife state they normally walked about 1-1.5 miles prior to his surgery. Did not use an a/d prior to surgery.    PERTINENT HISTORY: Seizure disorder  PAIN:  Are you having pain? Yes: NPRS scale: 1-2 currently, at worst 5-6 Pain location: sides of knee into muscle Pain description: aching Aggravating factors: prolonged activity Relieving factors: icing  PRECAUTIONS: None  RED FLAGS: None   WEIGHT BEARING RESTRICTIONS: No  FALLS:  Has patient fallen in last 6 months? 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  DIAGNOSTIC FINDINGS: nothing recent since surgery on 03/04/24  PATIENT SURVEYS:  Lower Extremity Functional Score: 48 / 80 = 60.0 %  COGNITION: Overall cognitive status: Within functional limits for tasks assessed     SENSATION: WFL  EDEMA:  Mild edema on L  MUSCLE LENGTH: Hamstrings: Right ~60 deg; Left ~45 deg Thomas test: WNL  POSTURE: forward head  PALPATION: TTP along insertion of L adductors, proximal quad and ITB  LOWER EXTREMITY ROM:  Active ROM Right eval Left eval  Hip flexion    Hip extension    Hip abduction    Hip adduction    Hip internal rotation    Hip external rotation    Knee flexion 120 103  Knee extension -5 -14  Ankle dorsiflexion    Ankle plantarflexion    Ankle inversion    Ankle eversion     (Blank rows = not tested)  LOWER EXTREMITY MMT:  MMT Right eval Left eval  Hip flexion 5 4-  Hip extension  4-  Hip abduction 4- 4-  Hip adduction    Hip internal rotation    Hip external rotation    Knee flexion 4- 4-  Knee extension 5 4-  Ankle dorsiflexion    Ankle plantarflexion    Ankle inversion     Ankle eversion     (Blank rows = not tested)  LOWER EXTREMITY SPECIAL TESTS:  Did not assess  FUNCTIONAL TESTS:  5 times sit to stand: 11.58 sec Item Test date: 03/17/24 Test date:  Test date:   Sitting to standing 4. able to stand without using hands and stabilize independently Insert OPRCBERGREEVAL SmartPhrase at re-test date Insert OPRCBERGREEVAL SmartPhrase at re-test date  2. Standing unsupported 4. able to stand safely for 2 minutes    3. Sitting with back unsupported, feet supported 4. able to sit safely and securely for 2 minutes    4. Standing to sitting 4. sits safely with minimal use of hands    5. Pivot transfer  4. able to transfer safely with minor use of hands    6. Standing unsupported with eyes closed 4. able to stand 10 seconds safely  7. Standing unsupported with feet together 4. able to place feet together independently and stand 1 minute safely    8. Reaching forward with outstretched arms while standing 4. can reach forward confidently 25 cm (10 inches)    9. Pick up object from the floor from standing 4. able to pick up slipper safely and easily    10. Turning to look behind over left and right shoulders while standing 4. looks behind from both sides and weight shifts well    11. Turn 360 degrees 2. able to turn 360 degrees safely but slowly    12. Place alternate foot on step or stool while standing unsupported 4. ble to stand independently and safely and complete 8 steps in 20 seconds    13. Standing unsupported one foot in front 4. able to place foot tandem independently and hold 30 seconds    14. Standing on one leg 4. able to lift leg independently and hold > 10 seconds     Total Score 56/56 Total Score /56 Total Score /56    FUNCTIONAL GAIT ASSESSMENT  Date: 03/17/24 Score  GAIT LEVEL SURFACE Instructions: Walk at your normal speed from here to the next mark (6 m) [20 ft]. (2) Mild impairment - Walks 6 m (20 ft) in less than 7 seconds but greater than 5.5  seconds, uses assistive device, slower speed, mild gait deviations, or deviates 15.24 -25.4 cm (6 -10 in) outside of the 30.48-cm (12-in) walkway width.  2.   CHANGE IN GAIT SPEED Instructions: Begin walking at your normal pace (for 1.5 m [5 ft]). When I tell you "go," walk as fast as you can (for 1.5 m [5 ft]). When I tell you "slow," walk as slowly as you can (for 1.5 m [5 ft]. (2) Mild impairment - Is able to change speed but demonstrates mild gait deviations, deviates 15.24 -25.4 cm (6 -10 in) outside of the 30.48-cm (12-in) walkway width, or no gait deviations but unable to achieve a significant change in velocity, or uses an assistive device  3.    GAIT WITH HORIZONTAL HEAD TURNS Instructions: Walk from here to the next mark 6 m (20 ft) away. Begin walking at your normal pace. Keep walking straight; after 3 steps, turn your head to the right and keep walking straight while looking to the right. After 3 more steps, turn your head to the left and keep walking straight while looking left. Continue alternating looking right and left. (2) Mild impairment - Performs head turns smoothly with slight change in gait velocity (eg, minor disruption to smooth gait path), deviates 15.24 -25.4 cm (6 -10 in) outside 30.48-cm (12-in) walkway width, or uses an assistive device.  4.   GAIT WITH VERTICAL HEAD TURNS Instructions: Walk from here to the next mark (6 m [20 ft]). Begin walking at your normal pace. Keep walking straight; after 3 steps, tip your head up and keep walking straight while looking up. After 3 more steps, tip your head down, keep walking straight while looking down. Continue  alternating looking up and down every 3 steps until you have completed 2 repetitions in each direction. (2) Mild impairment - Performs task with slight change in gait velocity (eg, minor disruption to smooth gait path), deviates 15.24 -25.4 cm (6 -10 in) outside 30.48-cm (12-in) walkway width or uses assistive device.  5.  GAIT  AND PIVOT TURN Instructions: Begin with walking at your normal pace. When I tell you, "turn and stop," turn as  quickly as you can to face the opposite direction and stop. (3) Normal - Pivot turns safely within 3 seconds and stops quickly with no loss of balance  6.   STEP OVER OBSTACLE Instructions: Begin walking at your normal speed. When you come to the shoe box, step over it, not around it, and keep walking. (0) Severe impairment-Cannot perform without assistance. Uses walker  7.   GAIT WITH NARROW BASE OF SUPPORT Instructions: Walk on the floor with arms folded across the chest, feet aligned heel to toe in tandem for a distance of 3.6 m [12 ft]. The number of steps taken in a straight line are counted for a maximum of 10 steps. (3) Normal - Is able to ambulate for 10 steps heel to toe with no staggering.  8.   GAIT WITH EYES CLOSED Instructions: Walk at your normal speed from here to the next mark (6 m [20 ft]) with your eyes closed. (2) Mild impairment - Walks 6 m (20 ft), uses assistive device, slower speed, mild gait deviations, deviates 15.24 -25.4 cm (6 -10 in) outside 30.48-cm (12-in) walkway width. Ambulates 6 m (20 ft) in less than 9 seconds but greater than 7 seconds  9.   AMBULATING BACKWARDS Instructions: Walk backwards until I tell you to stop (2) Mild impairment - Walks 6 m (20 ft), uses assistive device, slower speed, mild gait deviations, deviates 15.24 -25.4 cm (6 -10 in) outside 30.48-cm (12-in) walkway width  10. STEPS Instructions: Walk up these stairs as you would at home (ie, using the rail if necessary). At the top turn around and walk down. (1) Moderate impairment-Two feet to a stair; must use rail.  Total 19/30   Interpretation of scores: Non-Specific Older Adults Cutoff Score: <=22/30 = risk of falls Parkinson's Disease Cutoff score <15/30= fall risk (Hoehn & Yahr 1-4)  Minimally Clinically Important Difference (MCID)  Stroke (acute, subacute, and chronic) = MDC: 4.2  points Vestibular (acute) = MDC: 6 points Community Dwelling Older Adults =  MCID: 4 points Parkinson's Disease  =  MDC: 4.3 points  (Academy of Neu[employer]ic Physical Therapy (nd). Functional Gait Assessment. Retrieved from https://www.neuropt.org/docs/default-source/cpgs/core-outcome-measures/function-gait-assessment-pocket-guide-proof9-(2).pdf?sfvrsn=b28f35043_0.)   GAIT: Distance walked: Into clinic Assistive device utilized: Walker - 2 wheeled; able to amb without a/d with good stability but with some gait impairments  Level of assistance: Modified independence Comments: Reciprocal gait pattern, L knee in flexion during stance phase/initial swing                                                                                                                                TREATMENT DATE:  03/17/24 See HEP below    PATIENT EDUCATION:  Education details: Exam findings, POC, HEP update with theraband, compression sock wear time, CPM Person educated: Patient Education method: Explanation and Demonstration Education comprehension: verbalized understanding, returned demonstration, and needs further education  HOME EXERCISE PROGRAM: Access Code: [email or web address] URL: [email or web address] Date: 03/17/2024 Prepared by:  Rosell Khouri April Marie Irlanda Croghan  Exercises - Seated Knee Extension with Resistance  - 1 x daily - 7 x weekly - 2 sets - 10 reps - 3 sec hold - Seated Hamstring Curls with Resistance  - 1 x daily - 7 x weekly - 2 sets - 10 reps - 3 sec hold - Standing Hip Abduction with Resistance at Ankles and Counter Support  - 1 x daily - 7 x weekly - 2 sets - 10 reps - Standing Hip Extension with Resistance at Ankles and Counter Support  - 1 x daily - 7 x weekly - 2 sets - 10 reps - Sit to Stand Without Arm Support  - 1 x daily - 7 x weekly - 2 sets - 10 reps - Seated Knee Flexion Stretch  - 1 x daily - 7 x weekly - 2 sets - 30 sec hold - Seated Hamstring Stretch  - 1 x daily - 7 x  weekly - 2 sets - 30 sec hold  ASSESSMENT:  CLINICAL IMPRESSION: Patient is a 83 y.o. M who was seen today for physical therapy evaluation and treatment s/p L TKA on 03/04/24. Pt is about 2 weeks post op and doing very well. Assessment demos decreased L knee ROM but already passed 90 deg of flexion and working mostly towards extension, slightly decreased strength compared to R but overall good stability with static standing. He is currently a low fall risk based on his Berg balance score but pt's FGA score currently places him at a fall risk only due to pt using RW during testing for safety. Despite this, pt was able to amb without a/d with good stability even with his gait abnormalities. Only time of true instability noted was during stair descent due to decreased eccentric quad strength.  Pt will benefit from PT to improve on these deficits and return to prior level of function.  OBJECTIVE IMPAIRMENTS: Abnormal gait, decreased activity tolerance, decreased balance, decreased endurance, decreased mobility, difficulty walking, decreased ROM, decreased strength, hypomobility, increased edema, increased fascial restrictions, increased muscle spasms, impaired flexibility, improper body mechanics, and pain.   ACTIVITY LIMITATIONS: carrying, lifting, bending, sitting, standing, squatting, stairs, transfers, and locomotion level  PARTICIPATION LIMITATIONS: meal prep, cleaning, laundry, driving, shopping, community activity, occupation, and yard work  PERSONAL FACTORS: Age, Fitness, Past/current experiences, and Time since onset of injury/illness/exacerbation are also affecting patient's functional outcome.   REHAB POTENTIAL: Good  CLINICAL DECISION MAKING: Evolving/moderate complexity  EVALUATION COMPLEXITY: Moderate   GOALS: Goals reviewed with patient? Yes  SHORT TERM GOALS: Target date: 04/14/2024  Pt will be ind with initial HEP Baseline: Goal status: INITIAL  2.  Pt will demo 5-110 deg of  L knee AROM for improved gait and stair ascent/descent Baseline: L knee AROM 14-103 Goal status: INITIAL  3.  Pt will be able to amb independently with normal reciprocal pattern for at least 400' for limited community distances Baseline: Currently using RW and/or cane Goal status: INITIAL   LONG TERM GOALS: Target date: 05/12/2024   Pt will be ind with management and progression of HEP Baseline:  Goal status: INITIAL  2.  Pt will demo improved L knee ROM to at least 5-115 deg for improved gait and stair ascent/descent Baseline: L knee AROM 14-103 Goal status: INITIAL  3.  Pt will be able to amb independently with normal reciprocal pattern and walk at least 1/2 a mile Baseline:  Goal status: INITIAL  4.  Pt will be able to ascend/descend ten  6 steps in reciprocal pattern without handrail to demo increased eccentric quad strength/control Baseline:  Goal status: INITIAL  5.  Pt will have improved FGA to >/=28/30 to demo low fall risk Baseline: 19 Goal status: INITIAL     PLAN:  PT FREQUENCY: 2x/week  PT DURATION: 8 weeks  PLANNED INTERVENTIONS: 97164- PT Re-evaluation, 97750- Physical Performance Testing, 97110-Therapeutic exercises, 97530- Therapeutic activity, 97112- Neuromuscular re-education, 97535- Self Care, 02859- Manual therapy, [phone]- Gait training, [phone]- Aquatic Therapy, [phone]- Electrical stimulation (unattended), 97016- Vasopneumatic device, [id number]- Ionotophoresis 4mg /ml Dexamethasone , 79439 (1-2 muscles), 20561 (3+ muscles)- Dry Needling, Patient/Family education, Balance training, Stair training, Taping, Joint mobilization, Scar mobilization, Cryotherapy, and Moist heat  PLAN FOR NEXT SESSION: Pt to bring in his current HEP -- will try to consolidate and update them. Continue to improve knee ROM. Initiate terminal knee extension in standing.   Maleeka Sabatino April Ma L Kristian Mogg, PT, DPT 03/17/2024, 10:42 AM

## 2024-03-17 ENCOUNTER — Ambulatory Visit: Attending: Orthopedic Surgery | Admitting: Physical Therapy

## 2024-03-17 ENCOUNTER — Other Ambulatory Visit: Payer: Self-pay

## 2024-03-17 DIAGNOSIS — M1712 Unilateral primary osteoarthritis, left knee: Secondary | ICD-10-CM | POA: Insufficient documentation

## 2024-03-17 DIAGNOSIS — R2689 Other abnormalities of gait and mobility: Secondary | ICD-10-CM | POA: Insufficient documentation

## 2024-03-17 DIAGNOSIS — M25662 Stiffness of left knee, not elsewhere classified: Secondary | ICD-10-CM | POA: Diagnosis not present

## 2024-03-17 DIAGNOSIS — M25562 Pain in left knee: Secondary | ICD-10-CM | POA: Insufficient documentation

## 2024-03-17 DIAGNOSIS — M6281 Muscle weakness (generalized): Secondary | ICD-10-CM | POA: Insufficient documentation

## 2024-03-18 ENCOUNTER — Ambulatory Visit: Admitting: Urology

## 2024-03-19 ENCOUNTER — Encounter: Payer: Self-pay | Admitting: Orthopedic Surgery

## 2024-03-19 ENCOUNTER — Ambulatory Visit (INDEPENDENT_AMBULATORY_CARE_PROVIDER_SITE_OTHER): Admitting: Orthopedic Surgery

## 2024-03-19 DIAGNOSIS — G8918 Other acute postprocedural pain: Secondary | ICD-10-CM

## 2024-03-19 DIAGNOSIS — M1712 Unilateral primary osteoarthritis, left knee: Secondary | ICD-10-CM

## 2024-03-19 DIAGNOSIS — Z96652 Presence of left artificial knee joint: Secondary | ICD-10-CM

## 2024-03-19 MED ORDER — HYDROCODONE-ACETAMINOPHEN 7.5-325 MG PO TABS: 1.0000 | ORAL_TABLET | Freq: Four times a day (QID) | ORAL | 0 refills | Status: AC | PRN

## 2024-03-19 NOTE — Progress Notes (Signed)
    POV # 1  POD 15  Chief Complaint  Patient presents with   Post-op Follow-up    Left total knee replacement 03/04/24    Encounter Diagnoses  Name Primary?   Unilateral primary osteoarthritis, left knee Yes   Status post total left knee replacement     DOI/DOS/ Date: 03/04/24   Admit date: 03/04/2024 Discharge date: 03/05/2024   Admission Diagnoses: left Knee osteoarthritis   Discharge Diagnoses: leftKnee osteoarthritis   Discharged Condition: Stable   Procedure: left knee arthroplasty  Per [facility] clinic policy, our goal is ensure optimal postoperative pain control with a multimodal pain management strategy. For all [facility] patients, our goal is to wean post-operative narcotic medications by 6 weeks post-operatively. If this is not possible due to utilization of pain medication prior to surgery, your [facility] doctor will support your acute post-operative pain control for the first 6 weeks postoperatively, with a plan to transition you back to your primary pain team following that. [name] will work to ensure a Therapist, occupational.

## 2024-03-19 NOTE — Progress Notes (Signed)
    POV # 1  POD 15  Chief Complaint  Patient presents with   Post-op Follow-up    Left total knee replacement 03/04/24    Encounter Diagnoses  Name Primary?   Unilateral primary osteoarthritis, left knee    Status post total left knee replacement    Post-op pain Yes    DOI/DOS/ Date: 03/04/24   Admit date: 03/04/2024 Discharge date: 03/05/2024   Admission Diagnoses: left Knee osteoarthritis   Discharge Diagnoses: leftKnee osteoarthritis   Discharged Condition: Stable   Procedure: left knee arthroplasty  Per [facility] clinic policy, our goal is ensure optimal postoperative pain control with a multimodal pain management strategy. For all [facility] patients, our goal is to wean post-operative narcotic medications by 6 weeks post-operatively. If this is not possible due to utilization of pain medication prior to surgery, your [facility] doctor will support your acute post-operative pain control for the first 6 weeks postoperatively, with a plan to transition you back to your primary pain team following that. Maralee will work to ensure a Therapist, occupational.  Levie is doing well with his knee staples were removed wound looks clean dry and intact.  Patient started physical therapy yesterday doing well walking with a cane  Recommend physical therapy continue  Follow-up 2 weeks before he goes on his trip to the beach  Medications refilled as needed  Meds ordered this encounter  Medications   HYDROcodone -acetaminophen  (NORCO) 7.5-325 MG tablet    Sig: Take 1 tablet by mouth every 6 (six) hours as needed for up to 5 days for moderate pain (pain score 4-6) (pain score 7-10).    Dispense:  20 tablet    Refill:  0    Continue aspirin  for DVT prophylaxis

## 2024-03-21 ENCOUNTER — Ambulatory Visit: Admitting: Physical Therapy

## 2024-03-21 ENCOUNTER — Encounter: Payer: Self-pay | Admitting: Physical Therapy

## 2024-03-21 DIAGNOSIS — R2689 Other abnormalities of gait and mobility: Secondary | ICD-10-CM

## 2024-03-21 DIAGNOSIS — M25562 Pain in left knee: Secondary | ICD-10-CM | POA: Diagnosis not present

## 2024-03-21 DIAGNOSIS — M25662 Stiffness of left knee, not elsewhere classified: Secondary | ICD-10-CM

## 2024-03-21 DIAGNOSIS — M1712 Unilateral primary osteoarthritis, left knee: Secondary | ICD-10-CM | POA: Diagnosis not present

## 2024-03-21 DIAGNOSIS — M6281 Muscle weakness (generalized): Secondary | ICD-10-CM

## 2024-03-21 NOTE — Therapy (Signed)
 OUTPATIENT PHYSICAL THERAPY LOWER TREATMENT   Patient Name: [name] MRN: [id number] DOB:[birth year], 83 y.o., male Today's Date: 03/21/2024  END OF SESSION:  PT End of Session - 03/21/24 0850     Visit Number 2    Number of Visits 16    Date for PT Re-Evaluation 05/12/24    Authorization Type Medicare    Progress Note Due on Visit 10    PT Start Time 0845    PT Stop Time 0925    PT Time Calculation (min) 40 min    Activity Tolerance Patient tolerated treatment well    Behavior During Therapy [facility] for tasks assessed/performed           Past Medical History:  Diagnosis Date   Arthritis    History of colon polyps    removed with colonoscopy   History of kidney stones    Hypothyroidism    Seizures (HCC)    none in 20 yrs- controlled with meds(Dr. 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   Past Surgical History:  Procedure Laterality Date   CHOLECYSTECTOMY  07/25/2012   Procedure: LAPAROSCOPIC CHOLECYSTECTOMY WITH INTRAOPERATIVE CHOLANGIOGRAM;  Surgeon: Lynda Leos, MD;  Location: [facility];  Service: General;;   COLONOSCOPY  11/15/2011   Procedure: COLONOSCOPY;  Surgeon: Claudis RAYMOND Rivet, MD;  Location: [facility];  Service: Endoscopy;  Laterality: N/A;  730   COLONOSCOPY WITH PROPOFOL  N/A 07/11/2022   Procedure: COLONOSCOPY WITH PROPOFOL ;  Surgeon: Eartha Angelia Sieving, MD;  Location: [facility];  Service: Gastroenterology;  Laterality: N/A;  730 ASA 3   CYSTOSCOPY WITH LITHOLAPAXY  08/09/2023   Procedure: CYSTOSCOPY WITH LITHOLAPAXY;  Surgeon: Sherrilee Belvie CROME, MD;  Location: [facility];  Service: Urology;;   ELBOW SURGERY     chipped bone right elbow   HOLMIUM LASER APPLICATION  08/09/2023   Procedure: HOLMIUM LASER APPLICATION;  Surgeon: Sherrilee Belvie CROME, MD;  Location: [facility];  Service: Urology;;   POLYPECTOMY  07/11/2022   Procedure: POLYPECTOMY INTESTINAL;  Surgeon: Eartha Angelia Sieving,  MD;  Location: [facility];  Service: Gastroenterology;;   TONSILLECTOMY     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   Procedure: ARTHROPLASTY, KNEE, TOTAL;  Surgeon: [name], MD;  Location: [facility];  Service: Orthopedics;  Laterality: Left;   TRANSURETHRAL RESECTION OF PROSTATE N/A 08/09/2023   Procedure: TRANSURETHRAL RESECTION OF THE PROSTATE (TURP);  Surgeon: Sherrilee Belvie CROME, MD;  Location: [facility];  Service: Urology;  Laterality: N/A;   VASECTOMY     Patient Active Problem List   Diagnosis Date Noted   Primary osteoarthritis of left knee 03/04/2024   Osteoarthritis of left knee 03/04/2024   Prostate nodule 11/13/2023   Family history of colon cancer 07/11/2022   History of colonic polyps 07/11/2022   Seizure (HCC) 06/13/2021   Nephrolithiasis 06/13/2021   Hypothyroidism 06/13/2021   Sciatica 06/13/2021   Benign prostatic hyperplasia with urinary obstruction 02/04/2021   Neuropathy, lower extremity 02/03/2019   Generalized idiopathic epilepsy and epileptic syndromes, not intractable, without status epilepticus (HCC) 08/18/2013   Rosacea 08/18/2013    PCP: [name], MD  REFERRING PROVIDER: [name], MD  REFERRING DIAG: [phone] (ICD-10-CM) - Unilateral primary osteoarthritis, left knee  THERAPY DIAG:  Stiffness of left knee, not elsewhere classified  Other abnormalities of gait and mobility  Muscle weakness (generalized)  Acute pain of left knee  Rationale for Evaluation and  Treatment: Rehabilitation  ONSET DATE: 03/04/24 L TKA  SUBJECTIVE:   SUBJECTIVE STATEMENT: Pt states they took the staples out. Ortho was pleased with pt's progress but pt states they were not pleased at the lack of appointments.   From eval: Pt states he is almost 2 weeks post op. Did have some HHPT and was d/c-ed last Friday. Has been compliant with HEP. Pt reports pain to be well managed. Has been using cane some in the house. Uses CPM 6 hours/day -- will be  done with it by next Friday. Pt and wife state they normally walked about 1-1.5 miles prior to his surgery. Did not use an a/d prior to surgery.   PERTINENT HISTORY: Seizure disorder  PAIN:  Are you having pain? Yes: NPRS scale: 1-2 currently, at worst 5-6 Pain location: sides of knee into muscle Pain description: aching Aggravating factors: prolonged activity Relieving factors: icing  PRECAUTIONS: None  RED FLAGS: None   WEIGHT BEARING RESTRICTIONS: No  FALLS:  Has patient fallen in last 6 months? 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  DIAGNOSTIC FINDINGS: nothing recent since surgery on 03/04/24  PATIENT SURVEYS:  Lower Extremity Functional Score: 48 / 80 = 60.0 %  COGNITION: Overall cognitive status: Within functional limits for tasks assessed     SENSATION: WFL  EDEMA:  Mild edema on L  MUSCLE LENGTH: Hamstrings: Right ~60 deg; Left ~45 deg Thomas test: WNL  POSTURE: forward head  PALPATION: TTP along insertion of L adductors, proximal quad and ITB  LOWER EXTREMITY ROM:  Active ROM Right eval Left eval  Hip flexion    Hip extension    Hip abduction    Hip adduction    Hip internal rotation    Hip external rotation    Knee flexion 120 103  Knee extension -5 -14  Ankle dorsiflexion    Ankle plantarflexion    Ankle inversion    Ankle eversion     (Blank rows = not tested)  LOWER EXTREMITY MMT:  MMT Right eval Left eval  Hip flexion 5 4-  Hip extension  4-  Hip abduction 4- 4-  Hip adduction    Hip internal rotation     Hip external rotation    Knee flexion 4- 4-  Knee extension 5 4-  Ankle dorsiflexion    Ankle plantarflexion    Ankle inversion    Ankle eversion     (Blank rows = not tested)  LOWER EXTREMITY SPECIAL TESTS:  Did not assess  FUNCTIONAL TESTS:  5 times sit to stand: 11.58 sec Item Test date: 03/17/24 Test date:  Test date:   Sitting to standing 4. able to stand without using hands and stabilize independently Insert OPRCBERGREEVAL SmartPhrase at re-test date Insert OPRCBERGREEVAL SmartPhrase at re-test date  2. Standing unsupported 4. able to stand safely for 2 minutes    3. Sitting with back unsupported, feet supported 4. able to sit safely and securely for 2 minutes    4. Standing to sitting 4. sits safely with minimal use of hands    5. Pivot  transfer  4. able to transfer safely with minor use of hands    6. Standing unsupported with eyes closed 4. able to stand 10 seconds safely    7. Standing unsupported with feet together 4. able to place feet together independently and stand 1 minute safely    8. Reaching forward with outstretched arms while standing 4. can reach forward confidently 25 cm (10 inches)    9. Pick up object from the floor from standing 4. able to pick up slipper safely and easily    10. Turning to look behind over left and right shoulders while standing 4. looks behind from both sides and weight shifts well    11. Turn 360 degrees 2. able to turn 360 degrees safely but slowly    12. Place alternate foot on step or stool while standing unsupported 4. ble to stand independently and safely and complete 8 steps in 20 seconds    13. Standing unsupported one foot in front 4. able to place foot tandem independently and hold 30 seconds    14. Standing on one leg 4. able to lift leg independently and hold > 10 seconds     Total Score 56/56 Total Score /56 Total Score /56    FUNCTIONAL GAIT ASSESSMENT  Date: 03/17/24 Score  GAIT LEVEL SURFACE Instructions: Walk at your  normal speed from here to the next mark (6 m) [20 ft]. (2) Mild impairment - Walks 6 m (20 ft) in less than 7 seconds but greater than 5.5 seconds, uses assistive device, slower speed, mild gait deviations, or deviates 15.24 -25.4 cm (6 -10 in) outside of the 30.48-cm (12-in) walkway width.  2.   CHANGE IN GAIT SPEED Instructions: Begin walking at your normal pace (for 1.5 m [5 ft]). When I tell you "go," walk as fast as you can (for 1.5 m [5 ft]). When I tell you "slow," walk as slowly as you can (for 1.5 m [5 ft]. (2) Mild impairment - Is able to change speed but demonstrates mild gait deviations, deviates 15.24 -25.4 cm (6 -10 in) outside of the 30.48-cm (12-in) walkway width, or no gait deviations but unable to achieve a significant change in velocity, or uses an assistive device  3.    GAIT WITH HORIZONTAL HEAD TURNS Instructions: Walk from here to the next mark 6 m (20 ft) away. Begin walking at your normal pace. Keep walking straight; after 3 steps, turn your head to the right and keep walking straight while looking to the right. After 3 more steps, turn your head to the left and keep walking straight while looking left. Continue alternating looking right and left. (2) Mild impairment - Performs head turns smoothly with slight change in gait velocity (eg, minor disruption to smooth gait path), deviates 15.24 -25.4 cm (6 -10 in) outside 30.48-cm (12-in) walkway width, or uses an assistive device.  4.   GAIT WITH VERTICAL HEAD TURNS Instructions: Walk from here to the next mark (6 m [20 ft]). Begin walking at your normal pace. Keep walking straight; after 3 steps, tip your head up and keep walking straight while looking up. After 3 more steps, tip your head down, keep walking straight while looking down. Continue  alternating looking up and down every 3 steps until you have completed 2 repetitions in each direction. (2) Mild impairment - Performs task with slight change in gait velocity (eg, minor  disruption to smooth gait path), deviates 15.24 -25.4 cm (6 -10 in) outside 30.48-cm (  12-in) walkway width or uses assistive device.  5.  GAIT AND PIVOT TURN Instructions: Begin with walking at your normal pace. When I tell you, "turn and stop," turn as quickly as you can to face the opposite direction and stop. (3) Normal - Pivot turns safely within 3 seconds and stops quickly with no loss of balance  6.   STEP OVER OBSTACLE Instructions: Begin walking at your normal speed. When you come to the shoe box, step over it, not around it, and keep walking. (0) Severe impairment-Cannot perform without assistance. Uses walker  7.   GAIT WITH NARROW BASE OF SUPPORT Instructions: Walk on the floor with arms folded across the chest, feet aligned heel to toe in tandem for a distance of 3.6 m [12 ft]. The number of steps taken in a straight line are counted for a maximum of 10 steps. (3) Normal - Is able to ambulate for 10 steps heel to toe with no staggering.  8.   GAIT WITH EYES CLOSED Instructions: Walk at your normal speed from here to the next mark (6 m [20 ft]) with your eyes closed. (2) Mild impairment - Walks 6 m (20 ft), uses assistive device, slower speed, mild gait deviations, deviates 15.24 -25.4 cm (6 -10 in) outside 30.48-cm (12-in) walkway width. Ambulates 6 m (20 ft) in less than 9 seconds but greater than 7 seconds  9.   AMBULATING BACKWARDS Instructions: Walk backwards until I tell you to stop (2) Mild impairment - Walks 6 m (20 ft), uses assistive device, slower speed, mild gait deviations, deviates 15.24 -25.4 cm (6 -10 in) outside 30.48-cm (12-in) walkway width  10. STEPS Instructions: Walk up these stairs as you would at home (ie, using the rail if necessary). At the top turn around and walk down. (1) Moderate impairment-Two feet to a stair; must use rail.  Total 19/30   Interpretation of scores: Non-Specific Older Adults Cutoff Score: <=22/30 = risk of falls Parkinson's Disease Cutoff  score <15/30= fall risk (Hoehn & Yahr 1-4)  Minimally Clinically Important Difference (MCID)  Stroke (acute, subacute, and chronic) = MDC: 4.2 points Vestibular (acute) = MDC: 6 points Community Dwelling Older Adults =  MCID: 4 points Parkinson's Disease  =  MDC: 4.3 points  (Academy of Neu[employer]ic Physical Therapy (nd). Functional Gait Assessment. Retrieved from https://www.neuropt.org/docs/default-source/cpgs/core-outcome-measures/function-gait-assessment-pocket-guide-proof9-(2).pdf?sfvrsn=b68f35043_0.)   GAIT: Distance walked: Into clinic Assistive device utilized: Walker - 2 wheeled; able to amb without a/d with good stability but with some gait impairments  Level of assistance: Modified independence Comments: Reciprocal gait pattern, L knee in flexion during stance phase/initial swing                                                                                                                                TREATMENT DATE:  03/21/24 Nustep L5 x 5 min LEs only Seated hamstring stretch x 30 Seated LAQ green TB 2x10 Seated hamstring curl green TB 2x10  Seated SLR 2x10 Standing hip abd green TB 2x10 bilat with UE support Standing hip ext green TB 2x10 bilat with UE support Squat 2x10 with UE support Standing terminal knee ext green TB 2x10 Gait training amb fwd no a/d, bwd walking no a/d 2x50' each, normal reciprocal pattern Tandem walking airex beam x 4 laps in // bars Side stepping on airex beam x 4 laps in // bars  03/17/24 See HEP below    PATIENT EDUCATION:  Education details: Exam findings, POC, HEP update with theraband, compression sock wear time, CPM Person educated: Patient Education method: Explanation and Demonstration Education comprehension: verbalized understanding, returned demonstration, and needs further education  HOME EXERCISE PROGRAM: Access Code: [email or web address] URL: [email or web address] Date: 03/17/2024 Prepared by: Micael Barb April Earnie Starring  Exercises - Seated Knee Extension with Resistance  - 1 x daily - 7 x weekly - 2 sets - 10 reps - 3 sec hold - Seated Hamstring Curls with Resistance  - 1 x daily - 7 x weekly - 2 sets - 10 reps - 3 sec hold - Standing Hip Abduction with Resistance at Ankles and Counter Support  - 1 x daily - 7 x weekly - 2 sets - 10 reps - Standing Hip Extension with Resistance at Ankles and Counter Support  - 1 x daily - 7 x weekly - 2 sets - 10 reps - Sit to Stand Without Arm Support  - 1 x daily - 7 x weekly - 2 sets - 10 reps - Seated Knee Flexion Stretch  - 1 x daily - 7 x weekly - 2 sets - 30 sec hold - Seated Hamstring Stretch  - 1 x daily - 7 x weekly - 2 sets - 30 sec hold  ASSESSMENT:  CLINICAL IMPRESSION: Able to progress pt to using green TB today. Gait quality is looking good without a/d. Working on improving terminal knee extension. ROM is progressing well. Can likely work on stairs and machine strengthening next session. Will try and consolidate his past home health exercises with outpatient exercises. Initiated dynamic balance on compliant surfaces to ready pt for walking on the beach for his upcoming trip 04/06/24 - 04/13/24.   From eval: Patient is a 83 y.o. M who was seen today for physical therapy evaluation and treatment s/p L TKA on 03/04/24. Pt is about 2 weeks post op and doing very well. Assessment demos decreased L knee ROM but already passed 90 deg of flexion and working mostly towards extension, slightly decreased strength compared to R but overall good stability with static standing. He is currently a low fall risk based on his Berg balance score but pt's FGA score currently places him at a fall risk only due to pt using RW during testing for safety. Despite this, pt was able to amb without a/d with good stability even with his gait abnormalities. Only time of true instability noted was during stair descent due to decreased eccentric quad strength.  Pt will benefit from PT to improve  on these deficits and return to prior level of function.  OBJECTIVE IMPAIRMENTS: Abnormal gait, decreased activity tolerance, decreased balance, decreased endurance, decreased mobility, difficulty walking, decreased ROM, decreased strength, hypomobility, increased edema, increased fascial restrictions, increased muscle spasms, impaired flexibility, improper body mechanics, and pain.   ACTIVITY LIMITATIONS: carrying, lifting, bending, sitting, standing, squatting, stairs, transfers, and locomotion level  PARTICIPATION LIMITATIONS: meal prep, cleaning, laundry, driving, shopping, community activity, occupation, and yard work  PERSONAL FACTORS: Age, Fitness, Past/current experiences, and  Time since onset of injury/illness/exacerbation are also affecting patient's functional outcome.   REHAB POTENTIAL: Good  CLINICAL DECISION MAKING: Evolving/moderate complexity  EVALUATION COMPLEXITY: Moderate   GOALS: Goals reviewed with patient? Yes  SHORT TERM GOALS: Target date: 04/14/2024  Pt will be ind with initial HEP Baseline: Goal status: INITIAL  2.  Pt will demo 5-110 deg of L knee AROM for improved gait and stair ascent/descent Baseline: L knee AROM 14-103 Goal status: INITIAL  3.  Pt will be able to amb independently with normal reciprocal pattern for at least 400' for limited community distances Baseline: Currently using RW and/or cane Goal status: INITIAL   LONG TERM GOALS: Target date: 05/12/2024   Pt will be ind with management and progression of HEP Baseline:  Goal status: INITIAL  2.  Pt will demo improved L knee ROM to at least 5-115 deg for improved gait and stair ascent/descent Baseline: L knee AROM 14-103 Goal status: INITIAL  3.  Pt will be able to amb independently with normal reciprocal pattern and walk at least 1/2 a mile Baseline:  Goal status: INITIAL  4.  Pt will be able to ascend/descend ten 6 steps in reciprocal pattern without handrail to demo increased  eccentric quad strength/control Baseline:  Goal status: INITIAL  5.  Pt will have improved FGA to >/=28/30 to demo low fall risk Baseline: 19 Goal status: INITIAL     PLAN:  PT FREQUENCY: 2x/week  PT DURATION: 8 weeks  PLANNED INTERVENTIONS: 97164- PT Re-evaluation, 97750- Physical Performance Testing, 97110-Therapeutic exercises, 97530- Therapeutic activity, [id number]- Neuromuscular re-education, 97535- Self Care, [id number]- Manual therapy, [id number]- Gait training, [phone]- Aquatic Therapy, [phone]- Electrical stimulation (unattended), 97016- Vasopneumatic device, [id number]- Ionotophoresis 4mg /ml Dexamethasone , 79439 (1-2 muscles), [id number] (3+ muscles)- Dry Needling, Patient/Family education, Balance training, Stair training, Taping, Joint mobilization, Scar mobilization, Cryotherapy, and Moist heat  PLAN FOR NEXT SESSION: Pt to bring in his current HEP -- will try to consolidate and update them. Continue to improve knee ROM. Eccentric quad strengthening. Increased weight bearing exercises. Dynamic balance.    Li Fragoso April Ma L Tamia Dial, PT, DPT 03/21/2024, 8:54 AM

## 2024-03-26 ENCOUNTER — Encounter: Payer: Self-pay | Admitting: Physical Therapy

## 2024-03-26 ENCOUNTER — Ambulatory Visit: Attending: Orthopedic Surgery | Admitting: Physical Therapy

## 2024-03-26 DIAGNOSIS — M6281 Muscle weakness (generalized): Secondary | ICD-10-CM | POA: Diagnosis not present

## 2024-03-26 DIAGNOSIS — R2689 Other abnormalities of gait and mobility: Secondary | ICD-10-CM | POA: Diagnosis not present

## 2024-03-26 DIAGNOSIS — M25662 Stiffness of left knee, not elsewhere classified: Secondary | ICD-10-CM | POA: Diagnosis not present

## 2024-03-26 DIAGNOSIS — M25562 Pain in left knee: Secondary | ICD-10-CM | POA: Insufficient documentation

## 2024-03-26 NOTE — Therapy (Signed)
 OUTPATIENT PHYSICAL THERAPY LOWER TREATMENT   Patient Name: [name] MRN: [id number] DOB:02-16-1941, 83 y.o., male Today's Date: 03/26/2024  END OF SESSION:  PT End of Session - 03/26/24 0847     Visit Number 3    Number of Visits 16    Date for PT Re-Evaluation 05/12/24    Authorization Type Medicare    Progress Note Due on Visit 10    PT Start Time [phone]    PT Stop Time 0925    PT Time Calculation (min) 42 min    Activity Tolerance Patient tolerated treatment well    Behavior During Therapy [facility] for tasks assessed/performed            Past Medical History:  Diagnosis Date   Arthritis    History of colon polyps    removed with colonoscopy   History of kidney stones    Hypothyroidism    Seizures (HCC)    none in 20 yrs- controlled with meds(Dr. 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   Past Surgical History:  Procedure Laterality Date   CHOLECYSTECTOMY  07/25/2012   Procedure: LAPAROSCOPIC CHOLECYSTECTOMY WITH INTRAOPERATIVE CHOLANGIOGRAM;  Surgeon: Lynda Leos, MD;  Location: [facility];  Service: General;;   COLONOSCOPY  11/15/2011   Procedure: COLONOSCOPY;  Surgeon: Claudis RAYMOND Rivet, MD;  Location: [facility];  Service: Endoscopy;  Laterality: N/A;  730   COLONOSCOPY WITH PROPOFOL  N/A 07/11/2022   Procedure: COLONOSCOPY WITH PROPOFOL ;  Surgeon: Eartha Angelia Sieving, MD;  Location: [facility];  Service: Gastroenterology;  Laterality: N/A;  730 ASA 3   CYSTOSCOPY WITH LITHOLAPAXY  08/09/2023   Procedure: CYSTOSCOPY WITH LITHOLAPAXY;  Surgeon: Sherrilee Belvie CROME, MD;  Location: [facility];  Service: Urology;;   ELBOW SURGERY     chipped bone right elbow   HOLMIUM LASER APPLICATION  08/09/2023   Procedure: HOLMIUM LASER APPLICATION;  Surgeon: Sherrilee Belvie CROME, MD;  Location: [facility];  Service: Urology;;   POLYPECTOMY  07/11/2022   Procedure: POLYPECTOMY INTESTINAL;  Surgeon: Eartha Angelia Sieving,  MD;  Location: [facility];  Service: Gastroenterology;;   TONSILLECTOMY     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   Procedure: ARTHROPLASTY, KNEE, TOTAL;  Surgeon: [name], MD;  Location: [facility];  Service: Orthopedics;  Laterality: Left;   TRANSURETHRAL RESECTION OF PROSTATE N/A 08/09/2023   Procedure: TRANSURETHRAL RESECTION OF THE PROSTATE (TURP);  Surgeon: Sherrilee Belvie CROME, MD;  Location: [facility];  Service: Urology;  Laterality: N/A;   VASECTOMY     Patient Active Problem List   Diagnosis Date Noted   Primary osteoarthritis of left knee 03/04/2024   Osteoarthritis of left knee 03/04/2024   Prostate nodule 11/13/2023   Family history of colon cancer 07/11/2022   History of colonic polyps 07/11/2022   Seizure (HCC) 06/13/2021   Nephrolithiasis 06/13/2021   Hypothyroidism 06/13/2021   Sciatica 06/13/2021   Benign prostatic hyperplasia with urinary obstruction 02/04/2021   Neuropathy, lower extremity 02/03/2019   Generalized idiopathic epilepsy and epileptic syndromes, not intractable, without status epilepticus (HCC) 08/18/2013   Rosacea 08/18/2013    PCP: [name], MD  REFERRING PROVIDER: [name], MD  REFERRING DIAG: [id number] (ICD-10-CM) - Unilateral primary osteoarthritis, left knee  THERAPY DIAG:  Stiffness of left knee, not elsewhere classified  Other abnormalities of gait and mobility  Muscle weakness (generalized)  Acute pain of left knee  Rationale for Evaluation  and Treatment: Rehabilitation  ONSET DATE: 03/04/24 L TKA  SUBJECTIVE:   SUBJECTIVE STATEMENT: Pt states he is up to 112 deg on the CPM. They will be picking it up tomorrow.   From eval: Pt states he is almost 2 weeks post op. Did have some HHPT and was d/c-ed last Friday. Has been compliant with HEP. Pt reports pain to be well managed. Has been using cane some in the house. Uses CPM 6 hours/day -- will be done with it by next Friday. Pt and wife state they normally  walked about 1-1.5 miles prior to his surgery. Did not use an a/d prior to surgery.   PERTINENT HISTORY: Seizure disorder  PAIN:  Are you having pain? Yes: NPRS scale: 1-2 currently, at worst 5-6 Pain location: sides of knee into muscle Pain description: aching Aggravating factors: prolonged activity Relieving factors: icing  PRECAUTIONS: None  RED FLAGS: None   WEIGHT BEARING RESTRICTIONS: No  FALLS:  Has patient fallen in last 6 months? 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  DIAGNOSTIC FINDINGS: nothing recent since surgery on 03/04/24  PATIENT SURVEYS:  Lower Extremity Functional Score: 48 / 80 = 60.0 %  COGNITION: Overall cognitive status: Within functional limits for tasks assessed     SENSATION: WFL  EDEMA:  Mild edema on L  MUSCLE LENGTH: Hamstrings: Right ~60 deg; Left ~45 deg Thomas test: WNL  POSTURE: forward head  PALPATION: TTP along insertion of L adductors, proximal quad and ITB  LOWER EXTREMITY ROM:  Active ROM Right eval Left eval Left 03/26/24  Hip flexion     Hip extension     Hip abduction     Hip adduction     Hip internal rotation     Hip external rotation     Knee flexion 120 103 110  Knee extension -5 -14 -3  Ankle dorsiflexion     Ankle plantarflexion     Ankle inversion     Ankle eversion      (Blank rows = not tested)  LOWER EXTREMITY MMT:  MMT Right eval Left eval  Hip flexion 5 4-  Hip extension  4-  Hip abduction 4- 4-  Hip adduction    Hip internal rotation    Hip external rotation     Knee flexion 4- 4-  Knee extension 5 4-  Ankle dorsiflexion    Ankle plantarflexion    Ankle inversion    Ankle eversion     (Blank rows = not tested)  LOWER EXTREMITY SPECIAL TESTS:  Did not assess  FUNCTIONAL TESTS:  5 times sit to stand: 11.58 sec Item Test date: 03/17/24 Test date:  Test date:   Sitting to standing 4. able to stand without using hands and stabilize independently Insert OPRCBERGREEVAL SmartPhrase at re-test date Insert OPRCBERGREEVAL SmartPhrase at re-test date  2. Standing unsupported 4. able to stand safely for 2 minutes    3. Sitting with back unsupported, feet supported 4. able to sit safely and securely for 2 minutes    4. Standing to sitting 4. sits safely with minimal  use of hands    5. Pivot transfer  4. able to transfer safely with minor use of hands    6. Standing unsupported with eyes closed 4. able to stand 10 seconds safely    7. Standing unsupported with feet together 4. able to place feet together independently and stand 1 minute safely    8. Reaching forward with outstretched arms while standing 4. can reach forward confidently 25 cm (10 inches)    9. Pick up object from the floor from standing 4. able to pick up slipper safely and easily    10. Turning to look behind over left and right shoulders while standing 4. looks behind from both sides and weight shifts well    11. Turn 360 degrees 2. able to turn 360 degrees safely but slowly    12. Place alternate foot on step or stool while standing unsupported 4. ble to stand independently and safely and complete 8 steps in 20 seconds    13. Standing unsupported one foot in front 4. able to place foot tandem independently and hold 30 seconds    14. Standing on one leg 4. able to lift leg independently and hold > 10 seconds     Total Score 56/56 Total Score /56 Total Score /56    FUNCTIONAL GAIT ASSESSMENT  Date: 03/17/24 Score  GAIT LEVEL SURFACE Instructions: Walk at your normal speed from here to the  next mark (6 m) [20 ft]. (2) Mild impairment - Walks 6 m (20 ft) in less than 7 seconds but greater than 5.5 seconds, uses assistive device, slower speed, mild gait deviations, or deviates 15.24 -25.4 cm (6 -10 in) outside of the 30.48-cm (12-in) walkway width.  2.   CHANGE IN GAIT SPEED Instructions: Begin walking at your normal pace (for 1.5 m [5 ft]). When I tell you "go," walk as fast as you can (for 1.5 m [5 ft]). When I tell you "slow," walk as slowly as you can (for 1.5 m [5 ft]. (2) Mild impairment - Is able to change speed but demonstrates mild gait deviations, deviates 15.24 -25.4 cm (6 -10 in) outside of the 30.48-cm (12-in) walkway width, or no gait deviations but unable to achieve a significant change in velocity, or uses an assistive device  3.    GAIT WITH HORIZONTAL HEAD TURNS Instructions: Walk from here to the next mark 6 m (20 ft) away. Begin walking at your normal pace. Keep walking straight; after 3 steps, turn your head to the right and keep walking straight while looking to the right. After 3 more steps, turn your head to the left and keep walking straight while looking left. Continue alternating looking right and left. (2) Mild impairment - Performs head turns smoothly with slight change in gait velocity (eg, minor disruption to smooth gait path), deviates 15.24 -25.4 cm (6 -10 in) outside 30.48-cm (12-in) walkway width, or uses an assistive device.  4.   GAIT WITH VERTICAL HEAD TURNS Instructions: Walk from here to the next mark (6 m [20 ft]). Begin walking at your normal pace. Keep walking straight; after 3 steps, tip your head up and keep walking straight while looking up. After 3 more steps, tip your head down, keep walking straight while looking down. Continue  alternating looking up and down every 3 steps until you have completed 2 repetitions in each direction. (2) Mild impairment - Performs task with slight change in gait velocity (eg, minor disruption to smooth gait path),  deviates  15.24 -25.4 cm (6 -10 in) outside 30.48-cm (12-in) walkway width or uses assistive device.  5.  GAIT AND PIVOT TURN Instructions: Begin with walking at your normal pace. When I tell you, "turn and stop," turn as quickly as you can to face the opposite direction and stop. (3) Normal - Pivot turns safely within 3 seconds and stops quickly with no loss of balance  6.   STEP OVER OBSTACLE Instructions: Begin walking at your normal speed. When you come to the shoe box, step over it, not around it, and keep walking. (0) Severe impairment-Cannot perform without assistance. Uses walker  7.   GAIT WITH NARROW BASE OF SUPPORT Instructions: Walk on the floor with arms folded across the chest, feet aligned heel to toe in tandem for a distance of 3.6 m [12 ft]. The number of steps taken in a straight line are counted for a maximum of 10 steps. (3) Normal - Is able to ambulate for 10 steps heel to toe with no staggering.  8.   GAIT WITH EYES CLOSED Instructions: Walk at your normal speed from here to the next mark (6 m [20 ft]) with your eyes closed. (2) Mild impairment - Walks 6 m (20 ft), uses assistive device, slower speed, mild gait deviations, deviates 15.24 -25.4 cm (6 -10 in) outside 30.48-cm (12-in) walkway width. Ambulates 6 m (20 ft) in less than 9 seconds but greater than 7 seconds  9.   AMBULATING BACKWARDS Instructions: Walk backwards until I tell you to stop (2) Mild impairment - Walks 6 m (20 ft), uses assistive device, slower speed, mild gait deviations, deviates 15.24 -25.4 cm (6 -10 in) outside 30.48-cm (12-in) walkway width  10. STEPS Instructions: Walk up these stairs as you would at home (ie, using the rail if necessary). At the top turn around and walk down. (1) Moderate impairment-Two feet to a stair; must use rail.  Total 19/30   Interpretation of scores: Non-Specific Older Adults Cutoff Score: <=22/30 = risk of falls Parkinson's Disease Cutoff score <15/30= fall risk (Hoehn &  Yahr 1-4)  Minimally Clinically Important Difference (MCID)  Stroke (acute, subacute, and chronic) = MDC: 4.2 points Vestibular (acute) = MDC: 6 points Community Dwelling Older Adults =  MCID: 4 points Parkinson's Disease  =  MDC: 4.3 points  (Academy of Neurologic Physical Therapy (nd). Functional Gait Assessment. Retrieved from https://www.neuropt.org/docs/default-source/cpgs/core-outcome-measures/function-gait-assessment-pocket-guide-proof9-(2).pdf?sfvrsn=b38f35043_0.)   GAIT: Distance walked: Into clinic Assistive device utilized: Walker - 2 wheeled; able to amb without a/d with good stability but with some gait impairments  Level of assistance: Modified independence Comments: Reciprocal gait pattern, L knee in flexion during stance phase/initial swing                                                                                                                                TREATMENT DATE:  03/26/24 Recumbent bike L1 x 5 min fwd, 2 min bwd Seated knee ext machine 10#  2x10 Seated hamstring curl machine 15# 2x10 Seated knee ext stretch on hamstring curl machine x2 min; quad sets x10 Leg press double leg 55# 2x10, single leg 30# x10 Standing 2.5# hip ext at cables machine 2x10 Standing 2.5# hip abd at cables machine 2x10 Seated knee flexion self stretch x 2 min Prone quad stretch x 1 min Rechecked knee ROM  03/21/24 Nustep L5 x 5 min LEs only Seated hamstring stretch x 30 Seated LAQ green TB 2x10 Seated hamstring curl green TB 2x10 Seated SLR 2x10 Standing hip abd green TB 2x10 bilat with UE support Standing hip ext green TB 2x10 bilat with UE support Squat 2x10 with UE support Standing terminal knee ext green TB 2x10 Gait training amb fwd no a/d, bwd walking no a/d 2x50' each, normal reciprocal pattern Tandem walking airex beam x 4 laps in // bars Side stepping on airex beam x 4 laps in // bars  03/17/24 See HEP below    PATIENT EDUCATION:  Education details: Exam  findings, POC, HEP update with theraband, compression sock wear time, CPM Person educated: Patient Education method: Explanation and Demonstration Education comprehension: verbalized understanding, returned demonstration, and needs further education  HOME EXERCISE PROGRAM: Access Code: [email or web address] URL: [email or web address] Date: 03/17/2024 Prepared by: Rosalina Dingwall April Earnie Starring  Exercises - Seated Knee Extension with Resistance  - 1 x daily - 7 x weekly - 2 sets - 10 reps - 3 sec hold - Seated Hamstring Curls with Resistance  - 1 x daily - 7 x weekly - 2 sets - 10 reps - 3 sec hold - Standing Hip Abduction with Resistance at Ankles and Counter Support  - 1 x daily - 7 x weekly - 2 sets - 10 reps - Standing Hip Extension with Resistance at Ankles and Counter Support  - 1 x daily - 7 x weekly - 2 sets - 10 reps - Sit to Stand Without Arm Support  - 1 x daily - 7 x weekly - 2 sets - 10 reps - Seated Knee Flexion Stretch  - 1 x daily - 7 x weekly - 2 sets - 30 sec hold - Seated Hamstring Stretch  - 1 x daily - 7 x weekly - 2 sets - 30 sec hold  ASSESSMENT:  CLINICAL IMPRESSION: Consolidated pt's HEP. Demonstrated machine equivalents to his current strengthening regimen. Pt is demonstrating continued good gains with his ROM.   From eval: Patient is a 83 y.o. M who was seen today for physical therapy evaluation and treatment s/p L TKA on 03/04/24. Pt is about 2 weeks post op and doing very well. Assessment demos decreased L knee ROM but already passed 90 deg of flexion and working mostly towards extension, slightly decreased strength compared to R but overall good stability with static standing. He is currently a low fall risk based on his Berg balance score but pt's FGA score currently places him at a fall risk only due to pt using RW during testing for safety. Despite this, pt was able to amb without a/d with good stability even with his gait abnormalities. Only time of true  instability noted was during stair descent due to decreased eccentric quad strength.  Pt will benefit from PT to improve on these deficits and return to prior level of function.  OBJECTIVE IMPAIRMENTS: Abnormal gait, decreased activity tolerance, decreased balance, decreased endurance, decreased mobility, difficulty walking, decreased ROM, decreased strength, hypomobility, increased edema, increased fascial restrictions, increased muscle spasms, impaired flexibility, improper body mechanics, and  pain.   ACTIVITY LIMITATIONS: carrying, lifting, bending, sitting, standing, squatting, stairs, transfers, and locomotion level  PARTICIPATION LIMITATIONS: meal prep, cleaning, laundry, driving, shopping, community activity, occupation, and yard work  PERSONAL FACTORS: Age, Fitness, Past/current experiences, and Time since onset of injury/illness/exacerbation are also affecting patient's functional outcome.   REHAB POTENTIAL: Good  CLINICAL DECISION MAKING: Evolving/moderate complexity  EVALUATION COMPLEXITY: Moderate   GOALS: Goals reviewed with patient? Yes  SHORT TERM GOALS: Target date: 04/14/2024  Pt will be ind with initial HEP Baseline: Goal status: INITIAL  2.  Pt will demo 5-110 deg of L knee AROM for improved gait and stair ascent/descent Baseline: L knee AROM 14-103 Goal status: INITIAL  3.  Pt will be able to amb independently with normal reciprocal pattern for at least 400' for limited community distances Baseline: Currently using RW and/or cane Goal status: INITIAL   LONG TERM GOALS: Target date: 05/12/2024   Pt will be ind with management and progression of HEP Baseline:  Goal status: INITIAL  2.  Pt will demo improved L knee ROM to at least 5-115 deg for improved gait and stair ascent/descent Baseline: L knee AROM 14-103 Goal status: INITIAL  3.  Pt will be able to amb independently with normal reciprocal pattern and walk at least 1/2 a mile Baseline:  Goal  status: INITIAL  4.  Pt will be able to ascend/descend ten 6 steps in reciprocal pattern without handrail to demo increased eccentric quad strength/control Baseline:  Goal status: INITIAL  5.  Pt will have improved FGA to >/=28/30 to demo low fall risk Baseline: 19 Goal status: INITIAL     PLAN:  PT FREQUENCY: 2x/week  PT DURATION: 8 weeks  PLANNED INTERVENTIONS: 97164- PT Re-evaluation, 97750- Physical Performance Testing, 97110-Therapeutic exercises, 97530- Therapeutic activity, 97112- Neuromuscular re-education, 97535- Self Care, [id number]- Manual therapy, [id number]- Gait training, [phone]- Aquatic Therapy, [phone]- Electrical stimulation (unattended), 97016- Vasopneumatic device, [id number]- Ionotophoresis 4mg /ml Dexamethasone , 79439 (1-2 muscles), 20561 (3+ muscles)- Dry Needling, Patient/Family education, Balance training, Stair training, Taping, Joint mobilization, Scar mobilization, Cryotherapy, and Moist heat  PLAN FOR NEXT SESSION:  Continue to improve knee ROM. Eccentric quad strengthening/stairs. Increased weight bearing exercises. Dynamic balance.    Rosemary Mossbarger April Ma L Kimberla Driskill, PT, DPT 03/26/2024, 8:48 AM

## 2024-03-31 ENCOUNTER — Ambulatory Visit: Admitting: Physical Therapy

## 2024-03-31 DIAGNOSIS — M25662 Stiffness of left knee, not elsewhere classified: Secondary | ICD-10-CM | POA: Diagnosis not present

## 2024-03-31 DIAGNOSIS — M25562 Pain in left knee: Secondary | ICD-10-CM | POA: Diagnosis not present

## 2024-03-31 DIAGNOSIS — R2689 Other abnormalities of gait and mobility: Secondary | ICD-10-CM

## 2024-03-31 DIAGNOSIS — Z96652 Presence of left artificial knee joint: Secondary | ICD-10-CM | POA: Insufficient documentation

## 2024-03-31 DIAGNOSIS — M6281 Muscle weakness (generalized): Secondary | ICD-10-CM

## 2024-03-31 NOTE — Therapy (Signed)
 OUTPATIENT PHYSICAL THERAPY LOWER TREATMENT   Patient Name: [name] MRN: [id number] DOB:1941-06-13, 83 y.o., male Today's Date: 03/31/2024  END OF SESSION:  PT End of Session - 03/31/24 0847     Visit Number 4    Number of Visits 16    Date for PT Re-Evaluation 05/12/24    Authorization Type Medicare    Progress Note Due on Visit 10    PT Start Time [phone]    PT Stop Time 0921    PT Time Calculation (min) 38 min    Activity Tolerance Patient tolerated treatment well    Behavior During Therapy [facility] for tasks assessed/performed             Past Medical History:  Diagnosis Date   Arthritis    History of colon polyps    removed with colonoscopy   History of kidney stones    Hypothyroidism    Seizures (HCC)    none in 20 yrs- controlled with meds(Dr. 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   Past Surgical History:  Procedure Laterality Date   CHOLECYSTECTOMY  07/25/2012   Procedure: LAPAROSCOPIC CHOLECYSTECTOMY WITH INTRAOPERATIVE CHOLANGIOGRAM;  Surgeon: Lynda Leos, MD;  Location: [facility];  Service: General;;   COLONOSCOPY  11/15/2011   Procedure: COLONOSCOPY;  Surgeon: Claudis RAYMOND Rivet, MD;  Location: [facility];  Service: Endoscopy;  Laterality: N/A;  730   COLONOSCOPY WITH PROPOFOL  N/A 07/11/2022   Procedure: COLONOSCOPY WITH PROPOFOL ;  Surgeon: Eartha Angelia Sieving, MD;  Location: [facility];  Service: Gastroenterology;  Laterality: N/A;  730 ASA 3   CYSTOSCOPY WITH LITHOLAPAXY  08/09/2023   Procedure: CYSTOSCOPY WITH LITHOLAPAXY;  Surgeon: Sherrilee Belvie CROME, MD;  Location: [facility];  Service: Urology;;   ELBOW SURGERY     chipped bone right elbow   HOLMIUM LASER APPLICATION  08/09/2023   Procedure: HOLMIUM LASER APPLICATION;  Surgeon: Sherrilee Belvie CROME, MD;  Location: [facility];  Service: Urology;;   POLYPECTOMY  07/11/2022   Procedure: POLYPECTOMY INTESTINAL;  Surgeon: Eartha Angelia Sieving, MD;  Location: [facility];  Service: Gastroenterology;;   TONSILLECTOMY     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   Procedure: ARTHROPLASTY, KNEE, TOTAL;  Surgeon: [name], MD;  Location: [facility];  Service: Orthopedics;  Laterality: Left;   TRANSURETHRAL RESECTION OF PROSTATE N/A 08/09/2023   Procedure: TRANSURETHRAL RESECTION OF THE PROSTATE (TURP);  Surgeon: Sherrilee Belvie CROME, MD;  Location: [facility];  Service: Urology;  Laterality: N/A;   VASECTOMY     Patient Active Problem List   Diagnosis Date Noted   Primary osteoarthritis of left knee 03/04/2024   Osteoarthritis of left knee 03/04/2024   Prostate nodule 11/13/2023   Family history of colon cancer 07/11/2022   History of colonic polyps 07/11/2022   Seizure (HCC) 06/13/2021   Nephrolithiasis 06/13/2021   Hypothyroidism 06/13/2021   Sciatica 06/13/2021   Benign prostatic hyperplasia with urinary obstruction 02/04/2021   Neuropathy, lower extremity 02/03/2019   Generalized idiopathic epilepsy and epileptic syndromes, not intractable, without status epilepticus (HCC) 08/18/2013   Rosacea 08/18/2013    PCP: [name], MD  REFERRING PROVIDER: [name], MD  REFERRING DIAG: M17.12 (ICD-10-CM) - Unilateral primary osteoarthritis, left knee  THERAPY DIAG:  No diagnosis found.  Rationale for Evaluation and Treatment: Rehabilitation  ONSET DATE: 03/04/24 L TKA  SUBJECTIVE:   SUBJECTIVE STATEMENT: Pt reports no new issues.  Has been compliant with HEP and does not feel a need to review current regimen.   From eval: Pt states he is almost 2 weeks post op. Did have some HHPT and was d/c-ed last Friday. Has been compliant with HEP. Pt reports pain to be well managed. Has been using cane some in the house. Uses CPM 6 hours/day -- will be done with it by next Friday. Pt and wife state they normally walked about 1-1.5 miles prior to his surgery. Did not use an a/d prior to surgery.   PERTINENT  HISTORY: Seizure disorder  PAIN:  Are you having pain? Yes: NPRS scale: 1-2 currently, at worst 5-6 Pain location: sides of knee into muscle Pain description: aching Aggravating factors: prolonged activity Relieving factors: icing  PRECAUTIONS: None  RED FLAGS: None   WEIGHT BEARING RESTRICTIONS: No  FALLS:  Has patient fallen in last 6 months? 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  DIAGNOSTIC FINDINGS: nothing recent since surgery on 03/04/24  PATIENT SURVEYS:  Lower Extremity Functional Score: 48 / 80 = 60.0 %  COGNITION: Overall cognitive status: Within functional limits for tasks assessed     SENSATION: WFL  EDEMA:  Mild edema on L  MUSCLE LENGTH: Hamstrings: Right ~60 deg; Left ~45 deg Thomas test: WNL  POSTURE: forward head  PALPATION: TTP along insertion of L adductors, proximal quad and ITB  LOWER EXTREMITY ROM:  Active ROM Right eval Left eval Left 03/26/24  Hip flexion     Hip extension     Hip abduction     Hip adduction     Hip internal rotation     Hip external rotation     Knee flexion 120 103 110  Knee extension -5 -14 -3  Ankle dorsiflexion     Ankle plantarflexion     Ankle inversion     Ankle eversion      (Blank rows = not tested)  LOWER EXTREMITY MMT:  MMT Right eval Left eval  Hip flexion 5 4-  Hip extension  4-  Hip abduction 4- 4-  Hip adduction    Hip internal rotation    Hip external rotation    Knee flexion 4- 4-  Knee extension 5 4-  Ankle dorsiflexion    Ankle plantarflexion    Ankle  inversion    Ankle eversion     (Blank rows = not tested)  LOWER EXTREMITY SPECIAL TESTS:  Did not assess  FUNCTIONAL TESTS:  5 times sit to stand: 11.58 sec Item Test date: 03/17/24 Test date:  Test date:   Sitting to standing 4. able to stand without using hands and stabilize independently Insert OPRCBERGREEVAL SmartPhrase at re-test date Insert OPRCBERGREEVAL SmartPhrase at re-test date  2. Standing unsupported 4. able to stand safely for 2 minutes    3. Sitting with back unsupported, feet supported 4. able to sit safely and securely for 2 minutes    4. Standing to sitting 4. sits safely with minimal use of hands    5. Pivot transfer  4. able to transfer safely with minor use  of hands    6. Standing unsupported with eyes closed 4. able to stand 10 seconds safely    7. Standing unsupported with feet together 4. able to place feet together independently and stand 1 minute safely    8. Reaching forward with outstretched arms while standing 4. can reach forward confidently 25 cm (10 inches)    9. Pick up object from the floor from standing 4. able to pick up slipper safely and easily    10. Turning to look behind over left and right shoulders while standing 4. looks behind from both sides and weight shifts well    11. Turn 360 degrees 2. able to turn 360 degrees safely but slowly    12. Place alternate foot on step or stool while standing unsupported 4. ble to stand independently and safely and complete 8 steps in 20 seconds    13. Standing unsupported one foot in front 4. able to place foot tandem independently and hold 30 seconds    14. Standing on one leg 4. able to lift leg independently and hold > 10 seconds     Total Score 56/56 Total Score /56 Total Score /56    FUNCTIONAL GAIT ASSESSMENT  Date: 03/17/24 Score  GAIT LEVEL SURFACE Instructions: Walk at your normal speed from here to the next mark (6 m) [20 ft]. (2) Mild impairment - Walks 6 m (20 ft) in less than 7 seconds but greater  than 5.5 seconds, uses assistive device, slower speed, mild gait deviations, or deviates 15.24 -25.4 cm (6 -10 in) outside of the 30.48-cm (12-in) walkway width.  2.   CHANGE IN GAIT SPEED Instructions: Begin walking at your normal pace (for 1.5 m [5 ft]). When I tell you "go," walk as fast as you can (for 1.5 m [5 ft]). When I tell you "slow," walk as slowly as you can (for 1.5 m [5 ft]. (2) Mild impairment - Is able to change speed but demonstrates mild gait deviations, deviates 15.24 -25.4 cm (6 -10 in) outside of the 30.48-cm (12-in) walkway width, or no gait deviations but unable to achieve a significant change in velocity, or uses an assistive device  3.    GAIT WITH HORIZONTAL HEAD TURNS Instructions: Walk from here to the next mark 6 m (20 ft) away. Begin walking at your normal pace. Keep walking straight; after 3 steps, turn your head to the right and keep walking straight while looking to the right. After 3 more steps, turn your head to the left and keep walking straight while looking left. Continue alternating looking right and left. (2) Mild impairment - Performs head turns smoothly with slight change in gait velocity (eg, minor disruption to smooth gait path), deviates 15.24 -25.4 cm (6 -10 in) outside 30.48-cm (12-in) walkway width, or uses an assistive device.  4.   GAIT WITH VERTICAL HEAD TURNS Instructions: Walk from here to the next mark (6 m [20 ft]). Begin walking at your normal pace. Keep walking straight; after 3 steps, tip your head up and keep walking straight while looking up. After 3 more steps, tip your head down, keep walking straight while looking down. Continue  alternating looking up and down every 3 steps until you have completed 2 repetitions in each direction. (2) Mild impairment - Performs task with slight change in gait velocity (eg, minor disruption to smooth gait path), deviates 15.24 -25.4 cm (6 -10 in) outside 30.48-cm (12-in) walkway width or uses assistive device.  5.  GAIT AND PIVOT TURN Instructions: Begin with walking at your normal pace. When I tell you, "turn and stop," turn as quickly as you can to face the opposite direction and stop. (3) Normal - Pivot turns safely within 3 seconds and stops quickly with no loss of balance  6.   STEP OVER OBSTACLE Instructions: Begin walking at your normal speed. When you come to the shoe box, step over it, not around it, and keep walking. (0) Severe impairment-Cannot perform without assistance. Uses walker  7.   GAIT WITH NARROW BASE OF SUPPORT Instructions: Walk on the floor with arms folded across the chest, feet aligned heel to toe in tandem for a distance of 3.6 m [12 ft]. The number of steps taken in a straight line are counted for a maximum of 10 steps. (3) Normal - Is able to ambulate for 10 steps heel to toe with no staggering.  8.   GAIT WITH EYES CLOSED Instructions: Walk at your normal speed from here to the next mark (6 m [20 ft]) with your eyes closed. (2) Mild impairment - Walks 6 m (20 ft), uses assistive device, slower speed, mild gait deviations, deviates 15.24 -25.4 cm (6 -10 in) outside 30.48-cm (12-in) walkway width. Ambulates 6 m (20 ft) in less than 9 seconds but greater than 7 seconds  9.   AMBULATING BACKWARDS Instructions: Walk backwards until I tell you to stop (2) Mild impairment - Walks 6 m (20 ft), uses assistive device, slower speed, mild gait deviations, deviates 15.24 -25.4 cm (6 -10 in) outside 30.48-cm (12-in) walkway width  10. STEPS Instructions: Walk up these stairs as you would at home (ie, using the rail if necessary). At the top turn around and walk down. (1) Moderate impairment-Two feet to a stair; must use rail.  Total 19/30   Interpretation of scores: Non-Specific Older Adults Cutoff Score: <=22/30 = risk of falls Parkinson's Disease Cutoff score <15/30= fall risk (Hoehn & Yahr 1-4)  Minimally Clinically Important Difference (MCID)  Stroke (acute, subacute, and chronic) = MDC:  4.2 points Vestibular (acute) = MDC: 6 points Community Dwelling Older Adults =  MCID: 4 points Parkinson's Disease  =  MDC: 4.3 points  (Academy of Neu[employer]ic Physical Therapy (nd). Functional Gait Assessment. Retrieved from https://www.neuropt.org/docs/default-source/cpgs/core-outcome-measures/function-gait-assessment-pocket-guide-proof9-(2).pdf?sfvrsn=b11f35043_0.)   GAIT: Distance walked: Into clinic Assistive device utilized: Walker - 2 wheeled; able to amb without a/d with good stability but with some gait impairments  Level of assistance: Modified independence Comments: Reciprocal gait pattern, L knee in flexion during stance phase/initial swing                                                                                                                                TREATMENT DATE:  03/31/24 Recumbent bike L5 x 5 min fwd and bwd Prone knee extension hang x 2 min Prone knee ext hang with quad set 2x10 Prone quad set 2x10 with PT assist for full  available range Prone quad stretch with strap x 1 min Prone hamstring curl at end range with strap assist x20 Seated knee ext machine 15# 2x10 double leg, 15# x15 single leg Seated hamstring curl machine 15# 2x10 double leg, 15# x20 single leg Leg press double leg 55# 2x10, single leg 35# x10 Side step up/down from 6 aerobic stepper 2x10 Fwd runner's step up from 6 aerobic stepper 2x10   03/26/24 Recumbent bike L1 x 5 min fwd, 2 min bwd Seated knee ext machine 10# 2x10 Seated hamstring curl machine 15# 2x10 Seated knee ext stretch on hamstring curl machine x2 min; quad sets x10 Leg press double leg 55# 2x10, single leg 30# x10 Standing 2.5# hip ext at cables machine 2x10 Standing 2.5# hip abd at cables machine 2x10 Seated knee flexion self stretch x 2 min Prone quad stretch x 1 min Rechecked knee ROM  03/21/24 Nustep L5 x 5 min LEs only Seated hamstring stretch x 30 Seated LAQ green TB 2x10 Seated hamstring curl green TB  2x10 Seated SLR 2x10 Standing hip abd green TB 2x10 bilat with UE support Standing hip ext green TB 2x10 bilat with UE support Squat 2x10 with UE support Standing terminal knee ext green TB 2x10 Gait training amb fwd no a/d, bwd walking no a/d 2x50' each, normal reciprocal pattern Tandem walking airex beam x 4 laps in // bars Side stepping on airex beam x 4 laps in // bars  03/17/24 See HEP below    PATIENT EDUCATION:  Education details: Exam findings, POC, HEP update with theraband, compression sock wear time, CPM Person educated: Patient Education method: Explanation and Demonstration Education comprehension: verbalized understanding, returned demonstration, and needs further education  HOME EXERCISE PROGRAM: Access Code: [email or web address] URL: [email or web address] Date: 03/17/2024 Prepared by: Dejanique Ruehl April Earnie Starring  Exercises - Seated Knee Extension with Resistance  - 1 x daily - 7 x weekly - 2 sets - 10 reps - 3 sec hold - Seated Hamstring Curls with Resistance  - 1 x daily - 7 x weekly - 2 sets - 10 reps - 3 sec hold - Standing Hip Abduction with Resistance at Ankles and Counter Support  - 1 x daily - 7 x weekly - 2 sets - 10 reps - Standing Hip Extension with Resistance at Ankles and Counter Support  - 1 x daily - 7 x weekly - 2 sets - 10 reps - Sit to Stand Without Arm Support  - 1 x daily - 7 x weekly - 2 sets - 10 reps - Seated Knee Flexion Stretch  - 1 x daily - 7 x weekly - 2 sets - 30 sec hold - Seated Hamstring Stretch  - 1 x daily - 7 x weekly - 2 sets - 30 sec hold  ASSESSMENT:  CLINICAL IMPRESSION: Continued to work on knee ROM and progressive strengthening. Quad strength is coming along as well as terminal knee extension. Working on improving knee strength with increased body weight along with dynamic balance using aerobic stepper today.   From eval: Patient is a 83 y.o. M who was seen today for physical therapy evaluation and treatment s/p L TKA on  03/04/24. Pt is about 2 weeks post op and doing very well. Assessment demos decreased L knee ROM but already passed 90 deg of flexion and working mostly towards extension, slightly decreased strength compared to R but overall good stability with static standing. He is currently a low fall risk based on his Berg balance score but  pt's FGA score currently places him at a fall risk only due to pt using RW during testing for safety. Despite this, pt was able to amb without a/d with good stability even with his gait abnormalities. Only time of true instability noted was during stair descent due to decreased eccentric quad strength.  Pt will benefit from PT to improve on these deficits and return to prior level of function.  OBJECTIVE IMPAIRMENTS: Abnormal gait, decreased activity tolerance, decreased balance, decreased endurance, decreased mobility, difficulty walking, decreased ROM, decreased strength, hypomobility, increased edema, increased fascial restrictions, increased muscle spasms, impaired flexibility, improper body mechanics, and pain.   ACTIVITY LIMITATIONS: carrying, lifting, bending, sitting, standing, squatting, stairs, transfers, and locomotion level  PARTICIPATION LIMITATIONS: meal prep, cleaning, laundry, driving, shopping, community activity, occupation, and yard work  PERSONAL FACTORS: Age, Fitness, Past/current experiences, and Time since onset of injury/illness/exacerbation are also affecting patient's functional outcome.   REHAB POTENTIAL: Good  CLINICAL DECISION MAKING: Evolving/moderate complexity  EVALUATION COMPLEXITY: Moderate   GOALS: Goals reviewed with patient? Yes  SHORT TERM GOALS: Target date: 04/14/2024  Pt will be ind with initial HEP Baseline: Goal status: INITIAL  2.  Pt will demo 5-110 deg of L knee AROM for improved gait and stair ascent/descent Baseline: L knee AROM 14-103 Goal status: INITIAL  3.  Pt will be able to amb independently with normal  reciprocal pattern for at least 400' for limited community distances Baseline: Currently using RW and/or cane Goal status: INITIAL   LONG TERM GOALS: Target date: 05/12/2024   Pt will be ind with management and progression of HEP Baseline:  Goal status: INITIAL  2.  Pt will demo improved L knee ROM to at least 5-115 deg for improved gait and stair ascent/descent Baseline: L knee AROM 14-103 Goal status: INITIAL  3.  Pt will be able to amb independently with normal reciprocal pattern and walk at least 1/2 a mile Baseline:  Goal status: INITIAL  4.  Pt will be able to ascend/descend ten 6 steps in reciprocal pattern without handrail to demo increased eccentric quad strength/control Baseline:  Goal status: INITIAL  5.  Pt will have improved FGA to >/=28/30 to demo low fall risk Baseline: 19 Goal status: INITIAL     PLAN:  PT FREQUENCY: 2x/week  PT DURATION: 8 weeks  PLANNED INTERVENTIONS: 97164- PT Re-evaluation, 97750- Physical Performance Testing, 97110-Therapeutic exercises, 97530- Therapeutic activity, 97112- Neuromuscular re-education, 97535- Self Care, [id number]- Manual therapy, [id number]- Gait training, [phone]- Aquatic Therapy, [phone]- Electrical stimulation (unattended), 97016- Vasopneumatic device, [id number]- Ionotophoresis 4mg /ml Dexamethasone , 79439 (1-2 muscles), [id number] (3+ muscles)- Dry Needling, Patient/Family education, Balance training, Stair training, Taping, Joint mobilization, Scar mobilization, Cryotherapy, and Moist heat  PLAN FOR NEXT SESSION:  Continue to improve knee ROM. Eccentric quad strengthening/stairs. Increased weight bearing exercises. Dynamic balance.    Duke Weisensel April Ma L Maribel Luis, PT, DPT 03/31/2024, 8:48 AM

## 2024-04-02 ENCOUNTER — Encounter: Payer: Self-pay | Admitting: Orthopedic Surgery

## 2024-04-02 ENCOUNTER — Ambulatory Visit: Admitting: Physical Therapy

## 2024-04-02 ENCOUNTER — Encounter: Payer: Self-pay | Admitting: Physical Therapy

## 2024-04-02 ENCOUNTER — Ambulatory Visit (INDEPENDENT_AMBULATORY_CARE_PROVIDER_SITE_OTHER): Admitting: Orthopedic Surgery

## 2024-04-02 DIAGNOSIS — M25562 Pain in left knee: Secondary | ICD-10-CM

## 2024-04-02 DIAGNOSIS — M25662 Stiffness of left knee, not elsewhere classified: Secondary | ICD-10-CM

## 2024-04-02 DIAGNOSIS — R2689 Other abnormalities of gait and mobility: Secondary | ICD-10-CM | POA: Diagnosis not present

## 2024-04-02 DIAGNOSIS — M6281 Muscle weakness (generalized): Secondary | ICD-10-CM | POA: Diagnosis not present

## 2024-04-02 DIAGNOSIS — Z96652 Presence of left artificial knee joint: Secondary | ICD-10-CM

## 2024-04-02 NOTE — Progress Notes (Signed)
   Routine postop follow-up patient doing well   Chief Complaint  Patient presents with   Post-op Follow-up    Encounter Diagnosis  Name Primary?   Status post total left knee replacement 03/04/24 Yes   DOI/DOS/ Date: 03/04/24  Improved  Patient carried his cane in today range of motion improving  Mild swelling in the joint no calf tenderness or swelling negative Homans' sign  Okay to go on the beach trip  Recheck 1 month

## 2024-04-02 NOTE — Therapy (Signed)
 OUTPATIENT PHYSICAL THERAPY LOWER TREATMENT   Patient Name: NATHYN LUIZ MRN: [id number] DOB:Dec 05, 1940, 83 y.o., male Today's Date: 04/02/2024  END OF SESSION:  PT End of Session - 04/02/24 0928     Visit Number 5    Number of Visits 16    Date for PT Re-Evaluation 05/12/24    Authorization Type Medicare    Progress Note Due on Visit 10    PT Start Time 0925    PT Stop Time 1005    PT Time Calculation (min) 40 min    Activity Tolerance Patient tolerated treatment well    Behavior During Therapy [facility] for tasks assessed/performed              Past Medical History:  Diagnosis Date   Arthritis    History of colon polyps    removed with colonoscopy   History of kidney stones    Hypothyroidism    Seizures (HCC)    none in 20 yrs- controlled with meds(Dr. 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   Past Surgical History:  Procedure Laterality Date   CHOLECYSTECTOMY  07/25/2012   Procedure: LAPAROSCOPIC CHOLECYSTECTOMY WITH INTRAOPERATIVE CHOLANGIOGRAM;  Surgeon: Lynda Leos, MD;  Location: [facility];  Service: General;;   COLONOSCOPY  11/15/2011   Procedure: COLONOSCOPY;  Surgeon: Claudis RAYMOND Rivet, MD;  Location: [facility];  Service: Endoscopy;  Laterality: N/A;  730   COLONOSCOPY WITH PROPOFOL  N/A 07/11/2022   Procedure: COLONOSCOPY WITH PROPOFOL ;  Surgeon: Eartha Angelia Sieving, MD;  Location: [facility];  Service: Gastroenterology;  Laterality: N/A;  730 ASA 3   CYSTOSCOPY WITH LITHOLAPAXY  08/09/2023   Procedure: CYSTOSCOPY WITH LITHOLAPAXY;  Surgeon: Sherrilee Belvie CROME, MD;  Location: [facility];  Service: Urology;;   ELBOW SURGERY     chipped bone right elbow   HOLMIUM LASER APPLICATION  08/09/2023   Procedure: HOLMIUM LASER APPLICATION;  Surgeon: Sherrilee Belvie CROME, MD;  Location: [facility];  Service: Urology;;   POLYPECTOMY  07/11/2022   Procedure: POLYPECTOMY INTESTINAL;  Surgeon: Eartha Angelia Sieving, MD;  Location: [facility];  Service: Gastroenterology;;   TONSILLECTOMY     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   Procedure: ARTHROPLASTY, KNEE, TOTAL;  Surgeon: [name], MD;  Location: [facility];  Service: Orthopedics;  Laterality: Left;   TRANSURETHRAL RESECTION OF PROSTATE N/A 08/09/2023   Procedure: TRANSURETHRAL RESECTION OF THE PROSTATE (TURP);  Surgeon: Sherrilee Belvie CROME, MD;  Location: [facility];  Service: Urology;  Laterality: N/A;   VASECTOMY     Patient Active Problem List   Diagnosis Date Noted   Status post total left knee replacement 03/04/24 03/31/2024   Primary osteoarthritis of left knee 03/04/2024   Osteoarthritis of left knee 03/04/2024   Prostate nodule 11/13/2023   Family history of colon cancer 07/11/2022   History of colonic polyps 07/11/2022   Seizure (HCC) 06/13/2021   Nephrolithiasis 06/13/2021   Hypothyroidism 06/13/2021   Sciatica 06/13/2021   Benign prostatic hyperplasia with urinary obstruction 02/04/2021   Neuropathy, lower extremity 02/03/2019   Generalized idiopathic epilepsy and epileptic syndromes, not intractable, without status epilepticus (HCC) 08/18/2013   Rosacea 08/18/2013    PCP: [name], MD  REFERRING PROVIDER: [name], MD  REFERRING DIAG: [phone] (ICD-10-CM) - Unilateral primary osteoarthritis, left knee  THERAPY DIAG:  Stiffness of left knee, not elsewhere classified  Other abnormalities of gait and mobility  Muscle  weakness (generalized)  Acute pain of left knee  Rationale for Evaluation and Treatment: Rehabilitation  ONSET DATE: 03/04/24 L TKA  SUBJECTIVE:   SUBJECTIVE STATEMENT: Pt reports he thinks he may have been doing the exercises too much -- did them twice a day and was sore yesterday. Had to take an aleve . Will follow up with Dr. Margrette.   From eval: Pt states he is almost 2 weeks post op. Did have some HHPT and was d/c-ed last Friday. Has been compliant with HEP. Pt  reports pain to be well managed. Has been using cane some in the house. Uses CPM 6 hours/day -- will be done with it by next Friday. Pt and wife state they normally walked about 1-1.5 miles prior to his surgery. Did not use an a/d prior to surgery.   PERTINENT HISTORY: Seizure disorder  PAIN:  Are you having pain? Yes: NPRS scale: 1-2 currently, at worst 5-6 Pain location: sides of knee into muscle Pain description: aching Aggravating factors: prolonged activity Relieving factors: icing  PRECAUTIONS: None  RED FLAGS: None   WEIGHT BEARING RESTRICTIONS: No  FALLS:  Has patient fallen in last 6 months? 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  DIAGNOSTIC FINDINGS: nothing recent since surgery on 03/04/24  PATIENT SURVEYS:  Lower Extremity Functional Score: 48 / 80 = 60.0 %  COGNITION: Overall cognitive status: Within functional limits for tasks assessed     SENSATION: WFL  EDEMA:  Mild edema on L  MUSCLE LENGTH: Hamstrings: Right ~60 deg; Left ~45 deg Thomas test: WNL  POSTURE: forward head  PALPATION: TTP along insertion of L adductors, proximal quad and ITB  LOWER EXTREMITY ROM:  Active ROM Right eval Left eval Left 03/26/24 Left 04/02/24  Hip flexion      Hip extension      Hip abduction      Hip adduction      Hip internal rotation      Hip external rotation      Knee flexion 120 103 110 AROM: 110 PROM: 115  Knee extension -5 -14 -3 AROM: -3 PROM: 0  Ankle dorsiflexion      Ankle plantarflexion      Ankle inversion      Ankle  eversion       (Blank rows = not tested)  LOWER EXTREMITY MMT:  MMT Right eval Left eval  Hip flexion 5 4-  Hip extension  4-  Hip abduction 4- 4-  Hip adduction    Hip internal rotation    Hip external rotation    Knee flexion 4- 4-  Knee extension 5 4-  Ankle dorsiflexion    Ankle plantarflexion    Ankle inversion    Ankle eversion     (Blank rows = not tested)  LOWER EXTREMITY SPECIAL TESTS:  Did not assess  FUNCTIONAL TESTS:  5 times sit to stand: 11.58 sec Item Test date: 03/17/24 Test date:  Test date:   Sitting to standing 4. able to stand without using hands and stabilize independently Insert OPRCBERGREEVAL SmartPhrase at re-test date Insert OPRCBERGREEVAL  SmartPhrase at re-test date  2. Standing unsupported 4. able to stand safely for 2 minutes    3. Sitting with back unsupported, feet supported 4. able to sit safely and securely for 2 minutes    4. Standing to sitting 4. sits safely with minimal use of hands    5. Pivot transfer  4. able to transfer safely with minor use of hands    6. Standing unsupported with eyes closed 4. able to stand 10 seconds safely    7. Standing unsupported with feet together 4. able to place feet together independently and stand 1 minute safely    8. Reaching forward with outstretched arms while standing 4. can reach forward confidently 25 cm (10 inches)    9. Pick up object from the floor from standing 4. able to pick up slipper safely and easily    10. Turning to look behind over left and right shoulders while standing 4. looks behind from both sides and weight shifts well    11. Turn 360 degrees 2. able to turn 360 degrees safely but slowly    12. Place alternate foot on step or stool while standing unsupported 4. ble to stand independently and safely and complete 8 steps in 20 seconds    13. Standing unsupported one foot in front 4. able to place foot tandem independently and hold 30 seconds    14. Standing on one leg 4. able to lift  leg independently and hold > 10 seconds     Total Score 56/56 Total Score /56 Total Score /56    FUNCTIONAL GAIT ASSESSMENT  Date: 03/17/24 Score  GAIT LEVEL SURFACE Instructions: Walk at your normal speed from here to the next mark (6 m) [20 ft]. (2) Mild impairment - Walks 6 m (20 ft) in less than 7 seconds but greater than 5.5 seconds, uses assistive device, slower speed, mild gait deviations, or deviates 15.24 -25.4 cm (6 -10 in) outside of the 30.48-cm (12-in) walkway width.  2.   CHANGE IN GAIT SPEED Instructions: Begin walking at your normal pace (for 1.5 m [5 ft]). When I tell you "go," walk as fast as you can (for 1.5 m [5 ft]). When I tell you "slow," walk as slowly as you can (for 1.5 m [5 ft]. (2) Mild impairment - Is able to change speed but demonstrates mild gait deviations, deviates 15.24 -25.4 cm (6 -10 in) outside of the 30.48-cm (12-in) walkway width, or no gait deviations but unable to achieve a significant change in velocity, or uses an assistive device  3.    GAIT WITH HORIZONTAL HEAD TURNS Instructions: Walk from here to the next mark 6 m (20 ft) away. Begin walking at your normal pace. Keep walking straight; after 3 steps, turn your head to the right and keep walking straight while looking to the right. After 3 more steps, turn your head to the left and keep walking straight while looking left. Continue alternating looking right and left. (2) Mild impairment - Performs head turns smoothly with slight change in gait velocity (eg, minor disruption to smooth gait path), deviates 15.24 -25.4 cm (6 -10 in) outside 30.48-cm (12-in) walkway width, or uses an assistive device.  4.   GAIT WITH VERTICAL HEAD TURNS Instructions: Walk from here to the next mark (6 m [20 ft]). Begin walking at your normal pace. Keep walking straight; after 3 steps, tip your head up and keep walking straight while looking up. After 3 more steps, tip your  head down, keep walking straight while looking down.  Continue  alternating looking up and down every 3 steps until you have completed 2 repetitions in each direction. (2) Mild impairment - Performs task with slight change in gait velocity (eg, minor disruption to smooth gait path), deviates 15.24 -25.4 cm (6 -10 in) outside 30.48-cm (12-in) walkway width or uses assistive device.  5.  GAIT AND PIVOT TURN Instructions: Begin with walking at your normal pace. When I tell you, "turn and stop," turn as quickly as you can to face the opposite direction and stop. (3) Normal - Pivot turns safely within 3 seconds and stops quickly with no loss of balance  6.   STEP OVER OBSTACLE Instructions: Begin walking at your normal speed. When you come to the shoe box, step over it, not around it, and keep walking. (0) Severe impairment-Cannot perform without assistance. Uses walker  7.   GAIT WITH NARROW BASE OF SUPPORT Instructions: Walk on the floor with arms folded across the chest, feet aligned heel to toe in tandem for a distance of 3.6 m [12 ft]. The number of steps taken in a straight line are counted for a maximum of 10 steps. (3) Normal - Is able to ambulate for 10 steps heel to toe with no staggering.  8.   GAIT WITH EYES CLOSED Instructions: Walk at your normal speed from here to the next mark (6 m [20 ft]) with your eyes closed. (2) Mild impairment - Walks 6 m (20 ft), uses assistive device, slower speed, mild gait deviations, deviates 15.24 -25.4 cm (6 -10 in) outside 30.48-cm (12-in) walkway width. Ambulates 6 m (20 ft) in less than 9 seconds but greater than 7 seconds  9.   AMBULATING BACKWARDS Instructions: Walk backwards until I tell you to stop (2) Mild impairment - Walks 6 m (20 ft), uses assistive device, slower speed, mild gait deviations, deviates 15.24 -25.4 cm (6 -10 in) outside 30.48-cm (12-in) walkway width  10. STEPS Instructions: Walk up these stairs as you would at home (ie, using the rail if necessary). At the top turn around and walk down. (1)  Moderate impairment-Two feet to a stair; must use rail.  Total 19/30   Interpretation of scores: Non-Specific Older Adults Cutoff Score: <=22/30 = risk of falls Parkinson's Disease Cutoff score <15/30= fall risk (Hoehn & Yahr 1-4)  Minimally Clinically Important Difference (MCID)  Stroke (acute, subacute, and chronic) = MDC: 4.2 points Vestibular (acute) = MDC: 6 points Community Dwelling Older Adults =  MCID: 4 points Parkinson's Disease  =  MDC: 4.3 points  (Academy of Neu[employer]ic [employer] (nd). Functional Gait Assessment. Retrieved from https://www.neuropt.org/docs/default-source/cpgs/core-outcome-measures/function-gait-assessment-pocket-guide-proof9-(2).pdf?sfvrsn=b60f35043_0.)   GAIT: Distance walked: Into clinic Assistive device utilized: Walker - 2 wheeled; able to amb without a/d with good stability but with some gait impairments  Level of assistance: Modified independence Comments: Reciprocal gait pattern, L knee in flexion during stance phase/initial swing  TREATMENT DATE:  04/02/24 Recumbent bike L4 x 8 min fwd and bwd Seated hamstring stretch 2x 30 Seated LAQ green TB 2x10 Seated SLR with strap assist to maintain terminal knee ext 2x10 Seated knee flexion self stretch x10, x5 with scooting forward Seated hamstring curl green TB 2x10 Fwd/bwd monster walk green TB 2x10 Single leg heel raise 2x10 Fwd runner's step up 4 step x10 Side step down 4 step x10   03/31/24 Recumbent bike L5 x 5 min fwd and bwd Prone knee extension hang x 2 min Prone knee ext hang with quad set 2x10 Prone quad set 2x10 with PT assist for full available range Prone quad stretch with strap x 1 min Prone hamstring curl at end range with strap assist x20 Seated knee ext machine 15# 2x10 double leg, 15# x15 single leg Seated hamstring curl machine 15# 2x10 double  leg, 15# x20 single leg Leg press double leg 55# 2x10, single leg 35# x10 Side step up/down from 6 aerobic stepper 2x10 Fwd runner's step up from 6 aerobic stepper 2x10   03/26/24 Recumbent bike L1 x 5 min fwd, 2 min bwd Seated knee ext machine 10# 2x10 Seated hamstring curl machine 15# 2x10 Seated knee ext stretch on hamstring curl machine x2 min; quad sets x10 Leg press double leg 55# 2x10, single leg 30# x10 Standing 2.5# hip ext at cables machine 2x10 Standing 2.5# hip abd at cables machine 2x10 Seated knee flexion self stretch x 2 min Prone quad stretch x 1 min Rechecked knee ROM    PATIENT EDUCATION:  Education details: Exam findings, POC, HEP update with theraband, compression sock wear time, CPM Person educated: Patient Education method: Explanation and Demonstration Education comprehension: verbalized understanding, returned demonstration, and needs further education  HOME EXERCISE PROGRAM: Access Code: [email or web address] URL: [email or web address] Date: 03/17/2024 Prepared by: Samaj Wessells April Earnie Starring  Exercises - Seated Knee Extension with Resistance  - 1 x daily - 7 x weekly - 2 sets - 10 reps - 3 sec hold - Seated Hamstring Curls with Resistance  - 1 x daily - 7 x weekly - 2 sets - 10 reps - 3 sec hold - Standing Hip Abduction with Resistance at Ankles and Counter Support  - 1 x daily - 7 x weekly - 2 sets - 10 reps - Standing Hip Extension with Resistance at Ankles and Counter Support  - 1 x daily - 7 x weekly - 2 sets - 10 reps - Sit to Stand Without Arm Support  - 1 x daily - 7 x weekly - 2 sets - 10 reps - Seated Knee Flexion Stretch  - 1 x daily - 7 x weekly - 2 sets - 30 sec hold - Seated Hamstring Stretch  - 1 x daily - 7 x weekly - 2 sets - 30 sec hold  ASSESSMENT:  CLINICAL IMPRESSION: Updated and reviewed HEP to ready pt for his beach trip. Has been very compliant to HEP. Knee flexion and extension continue to improve and pt is nearing his goals  for ROM. Continued to progress pt's strength and stability with good tolerance.   From eval: Patient is a 83 y.o. M who was seen today for physical therapy evaluation and treatment s/p L TKA on 03/04/24. Pt is about 2 weeks post op and doing very well. Assessment demos decreased L knee ROM but already passed 90 deg of flexion and working mostly towards extension, slightly decreased strength compared to R but overall good stability with static standing.  He is currently a low fall risk based on his Berg balance score but pt's FGA score currently places him at a fall risk only due to pt using RW during testing for safety. Despite this, pt was able to amb without a/d with good stability even with his gait abnormalities. Only time of true instability noted was during stair descent due to decreased eccentric quad strength.  Pt will benefit from PT to improve on these deficits and return to prior level of function.  OBJECTIVE IMPAIRMENTS: Abnormal gait, decreased activity tolerance, decreased balance, decreased endurance, decreased mobility, difficulty walking, decreased ROM, decreased strength, hypomobility, increased edema, increased fascial restrictions, increased muscle spasms, impaired flexibility, improper body mechanics, and pain.   ACTIVITY LIMITATIONS: carrying, lifting, bending, sitting, standing, squatting, stairs, transfers, and locomotion level  PARTICIPATION LIMITATIONS: meal prep, cleaning, laundry, driving, shopping, community activity, occupation, and yard work  PERSONAL FACTORS: Age, Fitness, Past/current experiences, and Time since onset of injury/illness/exacerbation are also affecting patient's functional outcome.   REHAB POTENTIAL: Good  CLINICAL DECISION MAKING: Evolving/moderate complexity  EVALUATION COMPLEXITY: Moderate   GOALS: Goals reviewed with patient? Yes  SHORT TERM GOALS: Target date: 04/14/2024  Pt will be ind with initial HEP Baseline: 04/02/24: pt has been  compliant. HEP continues to be updated.  Goal status: ONGOING  2.  Pt will demo 5-110 deg of L knee AROM for improved gait and stair ascent/descent Baseline: L knee AROM 14-103 04/02/24: 3-110 deg AROM  Goal status: MET  3.  Pt will be able to amb independently with normal reciprocal pattern for at least 400' for limited community distances Baseline: Currently using RW and/or cane 04/02/24: Using no a/d in the house Goal status: ONGOING   LONG TERM GOALS: Target date: 05/12/2024   Pt will be ind with management and progression of HEP Baseline:  Goal status: INITIAL  2.  Pt will demo improved L knee ROM to at least 5-115 deg for improved gait and stair ascent/descent Baseline: L knee AROM 14-103 Goal status: INITIAL  3.  Pt will be able to amb independently with normal reciprocal pattern and walk at least 1/2 a mile Baseline:  Goal status: INITIAL  4.  Pt will be able to ascend/descend ten 6 steps in reciprocal pattern without handrail to demo increased eccentric quad strength/control Baseline:  Goal status: INITIAL  5.  Pt will have improved FGA to >/=28/30 to demo low fall risk Baseline: 19 Goal status: INITIAL     PLAN:  PT FREQUENCY: 2x/week  PT DURATION: 8 weeks  PLANNED INTERVENTIONS: 97164- PT Re-evaluation, 97750- Physical Performance Testing, 97110-Therapeutic exercises, 97530- Therapeutic activity, 97112- Neuromuscular re-education, 97535- Self Care, [id number]- Manual therapy, [id number]- Gait training, [phone]- Aquatic Therapy, [phone]- Electrical stimulation (unattended), 97016- Vasopneumatic device, [id number]- Ionotophoresis 4mg /ml Dexamethasone , 20560 (1-2 muscles), 20561 (3+ muscles)- Dry Needling, Patient/Family education, Balance training, Stair training, Taping, Joint mobilization, Scar mobilization, Cryotherapy, and Moist heat  PLAN FOR NEXT SESSION:  Continue to improve knee ROM. Eccentric quad strengthening/stairs. Increased weight bearing exercises. Dynamic balance.     Jasia Hiltunen April Ma L Sherrell Weir, PT, DPT 04/02/2024, 9:28 AM

## 2024-04-02 NOTE — Progress Notes (Signed)
  Chief Complaint  Patient presents with   Post-op Follow-up    Encounter Diagnosis  Name Primary?   Status post total left knee replacement 03/04/24 Yes   DOI/DOS/ Date: 03/04/24  Improved

## 2024-04-07 ENCOUNTER — Encounter: Admitting: Physical Therapy

## 2024-04-09 ENCOUNTER — Ambulatory Visit: Admitting: Urology

## 2024-04-09 ENCOUNTER — Encounter: Admitting: Physical Therapy

## 2024-04-14 ENCOUNTER — Encounter: Payer: Self-pay | Admitting: Physical Therapy

## 2024-04-14 ENCOUNTER — Ambulatory Visit: Admitting: Physical Therapy

## 2024-04-14 DIAGNOSIS — Z13220 Encounter for screening for lipoid disorders: Secondary | ICD-10-CM | POA: Diagnosis not present

## 2024-04-14 DIAGNOSIS — M6281 Muscle weakness (generalized): Secondary | ICD-10-CM | POA: Diagnosis not present

## 2024-04-14 DIAGNOSIS — M25562 Pain in left knee: Secondary | ICD-10-CM

## 2024-04-14 DIAGNOSIS — R739 Hyperglycemia, unspecified: Secondary | ICD-10-CM | POA: Diagnosis not present

## 2024-04-14 DIAGNOSIS — E039 Hypothyroidism, unspecified: Secondary | ICD-10-CM | POA: Diagnosis not present

## 2024-04-14 DIAGNOSIS — Z130 Encounter for screening for diseases of the blood and blood-forming organs and certain disorders involving the immune mechanism: Secondary | ICD-10-CM | POA: Diagnosis not present

## 2024-04-14 DIAGNOSIS — M25662 Stiffness of left knee, not elsewhere classified: Secondary | ICD-10-CM

## 2024-04-14 DIAGNOSIS — R2689 Other abnormalities of gait and mobility: Secondary | ICD-10-CM

## 2024-04-14 NOTE — Therapy (Signed)
 OUTPATIENT PHYSICAL THERAPY LOWER TREATMENT   Patient Name: [name] MRN: [id number] DOB:1941/06/01, 83 y.o., male Today's Date: 04/14/2024  END OF SESSION:  PT End of Session - 04/14/24 0845     Visit Number 6    Number of Visits 16    Date for Recertification  05/12/24    Authorization Type Medicare    Progress Note Due on Visit 10    PT Start Time 0835    PT Stop Time 0915    PT Time Calculation (min) 40 min    Activity Tolerance Patient tolerated treatment well    Behavior During Therapy [facility] for tasks assessed/performed               Past Medical History:  Diagnosis Date   Arthritis    History of colon polyps    removed with colonoscopy   History of kidney stones    Hypothyroidism    Seizures (HCC)    none in 20 yrs- controlled with meds(Dr. 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   Past Surgical History:  Procedure Laterality Date   CHOLECYSTECTOMY  07/25/2012   Procedure: LAPAROSCOPIC CHOLECYSTECTOMY WITH INTRAOPERATIVE CHOLANGIOGRAM;  Surgeon: Fernando Leos, MD;  Location: [facility];  Service: General;;   COLONOSCOPY  11/15/2011   Procedure: COLONOSCOPY;  Surgeon: Fernando RAYMOND Rivet, MD;  Location: [facility];  Service: Endoscopy;  Laterality: N/A;  730   COLONOSCOPY WITH PROPOFOL  N/A 07/11/2022   Procedure: COLONOSCOPY WITH PROPOFOL ;  Surgeon: Fernando Angelia Sieving, MD;  Location: [facility];  Service: Gastroenterology;  Laterality: N/A;  730 ASA 3   CYSTOSCOPY WITH LITHOLAPAXY  08/09/2023   Procedure: CYSTOSCOPY WITH LITHOLAPAXY;  Surgeon: Fernando Belvie CROME, MD;  Location: [facility];  Service: Urology;;   ELBOW SURGERY     chipped bone right elbow   HOLMIUM LASER APPLICATION  08/09/2023   Procedure: HOLMIUM LASER APPLICATION;  Surgeon: Fernando Belvie CROME, MD;  Location: [facility];  Service: Urology;;   POLYPECTOMY  07/11/2022   Procedure: POLYPECTOMY INTESTINAL;  Surgeon: Fernando Angelia Sieving, MD;  Location: [facility];  Service: Gastroenterology;;   TONSILLECTOMY     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   Procedure: ARTHROPLASTY, KNEE, TOTAL;  Surgeon: Fernando Taft BRAVO, MD;  Location: [facility];  Service: Orthopedics;  Laterality: Left;   TRANSURETHRAL RESECTION OF PROSTATE N/A 08/09/2023   Procedure: TRANSURETHRAL RESECTION OF THE PROSTATE (TURP);  Surgeon: Fernando Belvie CROME, MD;  Location: [facility];  Service: Urology;  Laterality: N/A;   VASECTOMY     Patient Active Problem List   Diagnosis Date Noted   Status post total left knee replacement 03/04/24 03/31/2024   Primary osteoarthritis of left knee 03/04/2024   Osteoarthritis of left knee 03/04/2024   Prostate nodule 11/13/2023   Family history of colon cancer 07/11/2022   History of colonic polyps 07/11/2022   Seizure (HCC) 06/13/2021   Nephrolithiasis 06/13/2021   Hypothyroidism 06/13/2021   Sciatica 06/13/2021   Benign prostatic hyperplasia with urinary obstruction 02/04/2021   Neuropathy, lower extremity 02/03/2019   Generalized idiopathic epilepsy and epileptic syndromes, not intractable, without status epilepticus (HCC) 08/18/2013   Rosacea 08/18/2013    PCP: Fernando Elspeth BRAVO, MD  REFERRING PROVIDER: [name], MD  REFERRING DIAG: [phone] (ICD-10-CM) - Unilateral primary osteoarthritis, left knee  THERAPY DIAG:  Stiffness of left knee, not elsewhere classified  Other abnormalities of gait and mobility  Muscle weakness (generalized)  Acute pain of left knee  Rationale for Evaluation and Treatment: Rehabilitation  ONSET DATE: 03/04/24 L TKA  SUBJECTIVE:   SUBJECTIVE STATEMENT: He reports his knee is doing well  PERTINENT HISTORY: Seizure disorder  PAIN:  Are you having pain? Yes: NPRS scale: denies pain Pain location: sides of knee into muscle Pain description: aching Aggravating factors: prolonged activity Relieving factors: icing  PRECAUTIONS: None  RED  FLAGS: None   WEIGHT BEARING RESTRICTIONS: No  FALLS:  Has patient fallen in last 6 months? 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  DIAGNOSTIC FINDINGS: nothing recent since surgery on 03/04/24  PATIENT SURVEYS:  Lower Extremity Functional Score: 48 / 80 = 60.0 %  COGNITION: Overall cognitive status: Within functional limits for tasks assessed     SENSATION: WFL  EDEMA:  Mild edema on L  MUSCLE LENGTH: Hamstrings: Right ~60 deg; Left ~45 deg Thomas test: WNL  POSTURE: forward head  PALPATION: TTP along insertion of L adductors, proximal quad and ITB  LOWER EXTREMITY ROM:  Active ROM Right eval Left eval Left 03/26/24 Left 04/02/24 Left 9/22/5  Hip flexion       Hip extension       Hip abduction       Hip adduction       Hip internal rotation       Hip external rotation       Knee flexion 120 103 110 AROM: 110 PROM: 115 A:118 P:12-  Knee extension -5 -14 -3 AROM: -3 PROM: 0 AROM -3  Ankle dorsiflexion       Ankle plantarflexion       Ankle inversion       Ankle eversion        (Blank rows = not tested)  LOWER EXTREMITY MMT:  MMT Right eval Left eval Left 04/14/24  Hip flexion 5 4-   Hip extension  4-   Hip abduction 4- 4-   Hip adduction     Hip internal rotation     Hip external rotation     Knee flexion 4- 4- 5  Knee extension 5 4- 5  Ankle dorsiflexion     Ankle plantarflexion     Ankle inversion     Ankle eversion      (Blank rows = not tested)  LOWER EXTREMITY SPECIAL TESTS:  Did not assess  FUNCTIONAL TESTS:   5 times sit to stand: 11.58 sec Item Test date: 03/17/24 Test date:  Test date:   Sitting to standing 4. able to stand without using hands and stabilize independently Insert OPRCBERGREEVAL SmartPhrase at re-test date Insert OPRCBERGREEVAL SmartPhrase at re-test date  2. Standing unsupported 4. able to stand safely for 2 minutes    3. Sitting with back unsupported, feet supported 4. able to sit safely and securely for 2 minutes    4. Standing to sitting 4. sits safely with minimal use of hands    5. Pivot transfer  4. able to transfer safely with minor use of hands    6. Standing unsupported with eyes closed  4. able to stand 10 seconds safely    7. Standing unsupported with feet together 4. able to place feet together independently and stand 1 minute safely    8. Reaching forward with outstretched arms while standing 4. can reach forward confidently 25 cm (10 inches)    9. Pick up object from the floor from standing 4. able to pick up slipper safely and easily    10. Turning to look behind over left and right shoulders while standing 4. looks behind from both sides and weight shifts well    11. Turn 360 degrees 2. able to turn 360 degrees safely but slowly    12. Place alternate foot on step or stool while standing unsupported 4. ble to stand independently and safely and complete 8 steps in 20 seconds    13. Standing unsupported one foot in front 4. able to place foot tandem independently and hold 30 seconds    14. Standing on one leg 4. able to lift leg independently and hold > 10 seconds     Total Score 56/56 Total Score /56 Total Score /56    FUNCTIONAL GAIT ASSESSMENT  Date: 03/17/24 Score  GAIT LEVEL SURFACE Instructions: Walk at your normal speed from here to the next mark (6 m) [20 ft]. (2) Mild impairment - Walks 6 m (20 ft) in less than 7 seconds but greater than 5.5 seconds, uses assistive device, slower speed, mild gait deviations, or deviates 15.24 -25.4 cm (6 -10 in) outside of the  30.48-cm (12-in) walkway width.  2.   CHANGE IN GAIT SPEED Instructions: Begin walking at your normal pace (for 1.5 m [5 ft]). When I tell you "go," walk as fast as you can (for 1.5 m [5 ft]). When I tell you "slow," walk as slowly as you can (for 1.5 m [5 ft]. (2) Mild impairment - Is able to change speed but demonstrates mild gait deviations, deviates 15.24 -25.4 cm (6 -10 in) outside of the 30.48-cm (12-in) walkway width, or no gait deviations but unable to achieve a significant change in velocity, or uses an assistive device  3.    GAIT WITH HORIZONTAL HEAD TURNS Instructions: Walk from here to the next mark 6 m (20 ft) away. Begin walking at your normal pace. Keep walking straight; after 3 steps, turn your head to the right and keep walking straight while looking to the right. After 3 more steps, turn your head to the left and keep walking straight while looking left. Continue alternating looking right and left. (2) Mild impairment - Performs head turns smoothly with slight change in gait velocity (eg, minor disruption to smooth gait path), deviates 15.24 -25.4 cm (6 -10 in) outside 30.48-cm (12-in) walkway width, or uses an assistive device.  4.   GAIT WITH VERTICAL HEAD TURNS Instructions: Walk from here to the next mark (6 m [20 ft]). Begin walking at your normal pace. Keep walking straight; after 3 steps, tip your head up and keep walking straight while looking up. After 3 more steps, tip your head down, keep walking straight while looking down. Continue  alternating looking up and down every 3 steps until you have completed 2 repetitions in each direction. (2) Mild impairment - Performs task with slight change in gait velocity (eg, minor disruption to smooth gait path), deviates 15.24 -25.4 cm (6 -10 in) outside 30.48-cm (12-in) walkway width or uses assistive device.  5.  GAIT AND PIVOT TURN Instructions: Begin with walking at your normal pace.  When I tell you, "turn and stop," turn as quickly as  you can to face the opposite direction and stop. (3) Normal - Pivot turns safely within 3 seconds and stops quickly with no loss of balance  6.   STEP OVER OBSTACLE Instructions: Begin walking at your normal speed. When you come to the shoe box, step over it, not around it, and keep walking. (0) Severe impairment-Cannot perform without assistance. Uses walker  7.   GAIT WITH NARROW BASE OF SUPPORT Instructions: Walk on the floor with arms folded across the chest, feet aligned heel to toe in tandem for a distance of 3.6 m [12 ft]. The number of steps taken in a straight line are counted for a maximum of 10 steps. (3) Normal - Is able to ambulate for 10 steps heel to toe with no staggering.  8.   GAIT WITH EYES CLOSED Instructions: Walk at your normal speed from here to the next mark (6 m [20 ft]) with your eyes closed. (2) Mild impairment - Walks 6 m (20 ft), uses assistive device, slower speed, mild gait deviations, deviates 15.24 -25.4 cm (6 -10 in) outside 30.48-cm (12-in) walkway width. Ambulates 6 m (20 ft) in less than 9 seconds but greater than 7 seconds  9.   AMBULATING BACKWARDS Instructions: Walk backwards until I tell you to stop (2) Mild impairment - Walks 6 m (20 ft), uses assistive device, slower speed, mild gait deviations, deviates 15.24 -25.4 cm (6 -10 in) outside 30.48-cm (12-in) walkway width  10. STEPS Instructions: Walk up these stairs as you would at home (ie, using the rail if necessary). At the top turn around and walk down. (1) Moderate impairment-Two feet to a stair; must use rail.  Total 19/30   Interpretation of scores: Non-Specific Older Adults Cutoff Score: <=22/30 = risk of falls Parkinson's Disease Cutoff score <15/30= fall risk (Hoehn & Yahr 1-4)  Minimally Clinically Important Difference (MCID)  Stroke (acute, subacute, and chronic) = MDC: 4.2 points Vestibular (acute) = MDC: 6 points Community Dwelling Older Adults =  MCID: 4 points Parkinson's Disease  =  MDC:  4.3 points  (Academy of Neurologic Physical Therapy (nd). Functional Gait Assessment. Retrieved from https://www.neuropt.org/docs/default-source/cpgs/core-outcome-measures/function-gait-assessment-pocket-guide-proof9-(2).pdf?sfvrsn=b38f35043_0.)   GAIT: Distance walked: Into clinic Assistive device utilized: Walker - 2 wheeled; able to amb without a/d with good stability but with some gait impairments  Level of assistance: Modified independence Comments: Reciprocal gait pattern, L knee in flexion during stance phase/initial swing                                                                                                                                TREATMENT DATE:  04/14/24 therex Recumbent bike L4 x 10 min fwd and bwd hamstring stretch 2x 30 Supine heel prop with quad sets 5 sec X 15 Updated measurements  Theractivity Step ups no UE support 6 inch step X 10 leading with left Stairs reciprocal X  5 reps over and back Leg press 40# 2X10 Leg extension 10# 2X10 Hamstring curl machine 25# 2X10   04/02/24 Recumbent bike L4 x 8 min fwd and bwd Seated hamstring stretch 2x 30 Seated LAQ green TB 2x10 Seated SLR with strap assist to maintain terminal knee ext 2x10 Seated knee flexion self stretch x10, x5 with scooting forward Seated hamstring curl green TB 2x10 Fwd/bwd monster walk green TB 2x10 Single leg heel raise 2x10 Fwd runner's step up 4 step x10 Side step down 4 step x10   03/31/24 Recumbent bike L5 x 5 min fwd and bwd Prone knee extension hang x 2 min Prone knee ext hang with quad set 2x10 Prone quad set 2x10 with PT assist for full available range Prone quad stretch with strap x 1 min Prone hamstring curl at end range with strap assist x20 Seated knee ext machine 15# 2x10 double leg, 15# x15 single leg Seated hamstring curl machine 15# 2x10 double leg, 15# x20 single leg Leg press double leg 55# 2x10, single leg 35# x10 Side step up/down from 6 aerobic  stepper 2x10 Fwd runner's step up from 6 aerobic stepper 2x10      PATIENT EDUCATION:  Education details: Exam findings, POC, HEP update with theraband, compression sock wear time, CPM Person educated: Patient Education method: Explanation and Demonstration Education comprehension: verbalized understanding, returned demonstration, and needs further education  HOME EXERCISE PROGRAM: Access Code: [email or web address] URL: [email or web address] Date: 03/17/2024 Prepared by: Gellen April Earnie Starring  Exercises - Seated Knee Extension with Resistance  - 1 x daily - 7 x weekly - 2 sets - 10 reps - 3 sec hold - Seated Hamstring Curls with Resistance  - 1 x daily - 7 x weekly - 2 sets - 10 reps - 3 sec hold - Standing Hip Abduction with Resistance at Ankles and Counter Support  - 1 x daily - 7 x weekly - 2 sets - 10 reps - Standing Hip Extension with Resistance at Ankles and Counter Support  - 1 x daily - 7 x weekly - 2 sets - 10 reps - Sit to Stand Without Arm Support  - 1 x daily - 7 x weekly - 2 sets - 10 reps - Seated Knee Flexion Stretch  - 1 x daily - 7 x weekly - 2 sets - 30 sec hold - Seated Hamstring Stretch  - 1 x daily - 7 x weekly - 2 sets - 30 sec hold  ASSESSMENT:  CLINICAL IMPRESSION: He is overall doing well with his knee post op. His measurements are looking good and he is on track to discharge early. We will assess his readiness next visit to see if he feels ready to transition to independent program.    From eval: Patient is a 83 y.o. M who was seen today for physical therapy evaluation and treatment s/p L TKA on 03/04/24. Pt is about 2 weeks post op and doing very well. Assessment demos decreased L knee ROM but already passed 90 deg of flexion and working mostly towards extension, slightly decreased strength compared to R but overall good stability with static standing. He is currently a low fall risk based on his Berg balance score but pt's FGA score currently places  him at a fall risk only due to pt using RW during testing for safety. Despite this, pt was able to amb without a/d with good stability even with his gait abnormalities. Only time of true instability noted was during stair descent due  to decreased eccentric quad strength.  Pt will benefit from PT to improve on these deficits and return to prior level of function.  OBJECTIVE IMPAIRMENTS: Abnormal gait, decreased activity tolerance, decreased balance, decreased endurance, decreased mobility, difficulty walking, decreased ROM, decreased strength, hypomobility, increased edema, increased fascial restrictions, increased muscle spasms, impaired flexibility, improper body mechanics, and pain.   ACTIVITY LIMITATIONS: carrying, lifting, bending, sitting, standing, squatting, stairs, transfers, and locomotion level  PARTICIPATION LIMITATIONS: meal prep, cleaning, laundry, driving, shopping, community activity, occupation, and yard work  PERSONAL FACTORS: Age, Fitness, Past/current experiences, and Time since onset of injury/illness/exacerbation are also affecting patient's functional outcome.   REHAB POTENTIAL: Good  CLINICAL DECISION MAKING: Evolving/moderate complexity  EVALUATION COMPLEXITY: Moderate   GOALS: Goals reviewed with patient? Yes  SHORT TERM GOALS: Target date: 04/14/2024  Pt will be ind with initial HEP Baseline: 04/02/24: pt has been compliant. HEP continues to be updated.  Goal status: MET 04/14/24  2.  Pt will demo 5-110 deg of L knee AROM for improved gait and stair ascent/descent Baseline: L knee AROM 14-103 04/02/24: 3-110 deg AROM  Goal status: MET 04/14/24  3.  Pt will be able to amb independently with normal reciprocal pattern for at least 400' for limited community distances Baseline: Currently using RW and/or cane 04/02/24: Using no a/d in the house Goal status: MET 04/14/24   LONG TERM GOALS: Target date: 05/12/2024   Pt will be ind with management and progression  of HEP Baseline:  Goal status: INITIAL  2.  Pt will demo improved L knee ROM to at least 5-115 deg for improved gait and stair ascent/descent Baseline: L knee AROM 14-103 Goal status: INITIAL  3.  Pt will be able to amb independently with normal reciprocal pattern and walk at least 1/2 a mile Baseline:  Goal status: INITIAL  4.  Pt will be able to ascend/descend ten 6 steps in reciprocal pattern without handrail to demo increased eccentric quad strength/control Baseline:  Goal status: INITIAL  5.  Pt will have improved FGA to >/=28/30 to demo low fall risk Baseline: 19 Goal status: INITIAL     PLAN:  PT FREQUENCY: 2x/week  PT DURATION: 8 weeks  PLANNED INTERVENTIONS: 97164- PT Re-evaluation, 97750- Physical Performance Testing, 97110-Therapeutic exercises, 97530- Therapeutic activity, 97112- Neuromuscular re-education, 97535- Self Care, 02859- Manual therapy, [phone]- Gait training, [phone]- Aquatic Therapy, [phone]- Electrical stimulation (unattended), 97016- Vasopneumatic device, [id number]- Ionotophoresis 4mg /ml Dexamethasone , 79439 (1-2 muscles), 20561 (3+ muscles)- Dry Needling, Patient/Family education, Balance training, Stair training, Taping, Joint mobilization, Scar mobilization, Cryotherapy, and Moist heat  PLAN FOR NEXT SESSION:  update LTG and prepare for discharge with HEP progressions.    Redell JONELLE Moose, PT, DPT 04/14/2024, 9:30 AM

## 2024-04-16 ENCOUNTER — Ambulatory Visit: Admitting: Physical Therapy

## 2024-04-16 ENCOUNTER — Encounter: Payer: Self-pay | Admitting: Physical Therapy

## 2024-04-16 DIAGNOSIS — M6281 Muscle weakness (generalized): Secondary | ICD-10-CM

## 2024-04-16 DIAGNOSIS — M25662 Stiffness of left knee, not elsewhere classified: Secondary | ICD-10-CM | POA: Diagnosis not present

## 2024-04-16 DIAGNOSIS — R2689 Other abnormalities of gait and mobility: Secondary | ICD-10-CM | POA: Diagnosis not present

## 2024-04-16 DIAGNOSIS — M25562 Pain in left knee: Secondary | ICD-10-CM

## 2024-04-16 NOTE — Therapy (Signed)
 OUTPATIENT PHYSICAL THERAPY LOWER TREATMENT   Patient Name: [name] MRN: [id number] DOB:06/15/1941, 83 y.o., male Today's Date: 04/16/2024  END OF SESSION:  PT End of Session - 04/16/24 1205     Visit Number 7    Number of Visits 16    Date for Recertification  05/12/24    Authorization Type Medicare    Progress Note Due on Visit 10    PT Start Time 1055    PT Stop Time 1135    PT Time Calculation (min) 40 min    Activity Tolerance Patient tolerated treatment well    Behavior During Therapy [facility] for tasks assessed/performed                Past Medical History:  Diagnosis Date   Arthritis    History of colon polyps    removed with colonoscopy   History of kidney stones    Hypothyroidism    Seizures (HCC)    none in 20 yrs- controlled with meds(Dr. 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   Past Surgical History:  Procedure Laterality Date   CHOLECYSTECTOMY  07/25/2012   Procedure: LAPAROSCOPIC CHOLECYSTECTOMY WITH INTRAOPERATIVE CHOLANGIOGRAM;  Surgeon: Lynda Leos, MD;  Location: [facility];  Service: General;;   COLONOSCOPY  11/15/2011   Procedure: COLONOSCOPY;  Surgeon: Claudis RAYMOND Rivet, MD;  Location: [facility];  Service: Endoscopy;  Laterality: N/A;  730   COLONOSCOPY WITH PROPOFOL  N/A 07/11/2022   Procedure: COLONOSCOPY WITH PROPOFOL ;  Surgeon: Eartha Angelia Sieving, MD;  Location: [facility];  Service: Gastroenterology;  Laterality: N/A;  730 ASA 3   CYSTOSCOPY WITH LITHOLAPAXY  08/09/2023   Procedure: CYSTOSCOPY WITH LITHOLAPAXY;  Surgeon: Sherrilee Belvie CROME, MD;  Location: [facility];  Service: Urology;;   ELBOW SURGERY     chipped bone right elbow   HOLMIUM LASER APPLICATION  08/09/2023   Procedure: HOLMIUM LASER APPLICATION;  Surgeon: Sherrilee Belvie CROME, MD;  Location: [facility];  Service: Urology;;   POLYPECTOMY  07/11/2022   Procedure: POLYPECTOMY INTESTINAL;  Surgeon: Eartha Angelia Sieving, MD;  Location: [facility];  Service: Gastroenterology;;   TONSILLECTOMY     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   Procedure: ARTHROPLASTY, KNEE, TOTAL;  Surgeon: [name], MD;  Location: [facility];  Service: Orthopedics;  Laterality: Left;   TRANSURETHRAL RESECTION OF PROSTATE N/A 08/09/2023   Procedure: TRANSURETHRAL RESECTION OF THE PROSTATE (TURP);  Surgeon: Sherrilee Belvie CROME, MD;  Location: [facility];  Service: Urology;  Laterality: N/A;   VASECTOMY     Patient Active Problem List   Diagnosis Date Noted   Status post total left knee replacement 03/04/24 03/31/2024   Primary osteoarthritis of left knee 03/04/2024   Osteoarthritis of left knee 03/04/2024   Prostate nodule 11/13/2023   Family history of colon cancer 07/11/2022   History of colonic polyps 07/11/2022   Seizure (HCC) 06/13/2021   Nephrolithiasis 06/13/2021   Hypothyroidism 06/13/2021   Sciatica 06/13/2021   Benign prostatic hyperplasia with urinary obstruction 02/04/2021   Neuropathy, lower extremity 02/03/2019   Generalized idiopathic epilepsy and epileptic syndromes, not intractable, without status epilepticus (HCC) 08/18/2013   Rosacea 08/18/2013    PCP: [name], MD  REFERRING PROVIDER: [name], MD  REFERRING DIAG: [phone] (ICD-10-CM) - Unilateral primary osteoarthritis, left knee  THERAPY DIAG:  Stiffness of left knee, not elsewhere classified  Other abnormalities of gait and mobility  Muscle weakness (generalized)  Acute pain of left knee  Rationale for Evaluation and Treatment: Rehabilitation  ONSET DATE: 03/04/24 L TKA  SUBJECTIVE:   SUBJECTIVE STATEMENT: He reports his knee is doing good other than some soreness in the back of his leg, he wants to come to PT one more week then feels he will be ready to finish up  PERTINENT HISTORY: Seizure disorder  PAIN:  Are you having pain? Yes: NPRS scale: denies pain Pain location: sides of knee into  muscle Pain description: aching Aggravating factors: prolonged activity Relieving factors: icing  PRECAUTIONS: None  RED FLAGS: None   WEIGHT BEARING RESTRICTIONS: No  FALLS:  Has patient fallen in last 6 months? 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  DIAGNOSTIC FINDINGS: nothing recent since surgery on 03/04/24  PATIENT SURVEYS:  Lower Extremity Functional Score: 48 / 80 = 60.0 %  COGNITION: Overall cognitive status: Within functional limits for tasks assessed     SENSATION: WFL  EDEMA:  Mild edema on L  MUSCLE LENGTH: Hamstrings: Right ~60 deg; Left ~45 deg Thomas test: WNL  POSTURE: forward head  PALPATION: TTP along insertion of L adductors, proximal quad and ITB  LOWER EXTREMITY ROM:  Active ROM Right eval Left eval Left 03/26/24 Left 04/02/24 Left 9/22/5  Hip flexion       Hip extension       Hip abduction       Hip adduction       Hip internal rotation       Hip external rotation       Knee flexion 120 103 110 AROM: 110 PROM: 115 A:118 P:12-  Knee extension -5 -14 -3 AROM: -3 PROM: 0 AROM -3  Ankle dorsiflexion       Ankle plantarflexion       Ankle inversion       Ankle eversion        (Blank rows = not tested)  LOWER EXTREMITY MMT:  MMT Right eval Left eval Left 04/14/24  Hip flexion 5 4-   Hip extension  4-   Hip abduction 4- 4-   Hip adduction     Hip internal rotation     Hip external rotation     Knee flexion 4- 4- 5  Knee extension 5 4- 5  Ankle dorsiflexion     Ankle plantarflexion     Ankle  inversion     Ankle eversion      (Blank rows = not tested)  LOWER EXTREMITY SPECIAL TESTS:  Did not assess  FUNCTIONAL TESTS:  5 times sit to stand: 11.58 sec Item Test date: 03/17/24 Test date:  Test date:   Sitting to standing 4. able to stand without using hands and stabilize independently Insert OPRCBERGREEVAL SmartPhrase at re-test date Insert OPRCBERGREEVAL SmartPhrase at re-test date  2. Standing unsupported 4. able to stand safely for 2 minutes    3. Sitting with back unsupported, feet supported 4. able to sit safely and securely for 2 minutes    4. Standing to sitting 4. sits safely with minimal use  of hands    5. Pivot transfer  4. able to transfer safely with minor use of hands    6. Standing unsupported with eyes closed 4. able to stand 10 seconds safely    7. Standing unsupported with feet together 4. able to place feet together independently and stand 1 minute safely    8. Reaching forward with outstretched arms while standing 4. can reach forward confidently 25 cm (10 inches)    9. Pick up object from the floor from standing 4. able to pick up slipper safely and easily    10. Turning to look behind over left and right shoulders while standing 4. looks behind from both sides and weight shifts well    11. Turn 360 degrees 2. able to turn 360 degrees safely but slowly    12. Place alternate foot on step or stool while standing unsupported 4. ble to stand independently and safely and complete 8 steps in 20 seconds    13. Standing unsupported one foot in front 4. able to place foot tandem independently and hold 30 seconds    14. Standing on one leg 4. able to lift leg independently and hold > 10 seconds     Total Score 56/56 Total Score /56 Total Score /56    FUNCTIONAL GAIT ASSESSMENT  Date: 03/17/24 Score  GAIT LEVEL SURFACE Instructions: Walk at your normal speed from here to the next mark (6 m) [20 ft]. (2) Mild impairment - Walks 6 m (20 ft) in less than 7 seconds but  greater than 5.5 seconds, uses assistive device, slower speed, mild gait deviations, or deviates 15.24 -25.4 cm (6 -10 in) outside of the 30.48-cm (12-in) walkway width.  2.   CHANGE IN GAIT SPEED Instructions: Begin walking at your normal pace (for 1.5 m [5 ft]). When I tell you "go," walk as fast as you can (for 1.5 m [5 ft]). When I tell you "slow," walk as slowly as you can (for 1.5 m [5 ft]. (2) Mild impairment - Is able to change speed but demonstrates mild gait deviations, deviates 15.24 -25.4 cm (6 -10 in) outside of the 30.48-cm (12-in) walkway width, or no gait deviations but unable to achieve a significant change in velocity, or uses an assistive device  3.    GAIT WITH HORIZONTAL HEAD TURNS Instructions: Walk from here to the next mark 6 m (20 ft) away. Begin walking at your normal pace. Keep walking straight; after 3 steps, turn your head to the right and keep walking straight while looking to the right. After 3 more steps, turn your head to the left and keep walking straight while looking left. Continue alternating looking right and left. (2) Mild impairment - Performs head turns smoothly with slight change in gait velocity (eg, minor disruption to smooth gait path), deviates 15.24 -25.4 cm (6 -10 in) outside 30.48-cm (12-in) walkway width, or uses an assistive device.  4.   GAIT WITH VERTICAL HEAD TURNS Instructions: Walk from here to the next mark (6 m [20 ft]). Begin walking at your normal pace. Keep walking straight; after 3 steps, tip your head up and keep walking straight while looking up. After 3 more steps, tip your head down, keep walking straight while looking down. Continue  alternating looking up and down every 3 steps until you have completed 2 repetitions in each direction. (2) Mild impairment - Performs task with slight change in gait velocity (eg, minor disruption to smooth gait path), deviates 15.24 -25.4  cm (6 -10 in) outside 30.48-cm (12-in) walkway width or uses assistive  device.  5.  GAIT AND PIVOT TURN Instructions: Begin with walking at your normal pace. When I tell you, "turn and stop," turn as quickly as you can to face the opposite direction and stop. (3) Normal - Pivot turns safely within 3 seconds and stops quickly with no loss of balance  6.   STEP OVER OBSTACLE Instructions: Begin walking at your normal speed. When you come to the shoe box, step over it, not around it, and keep walking. (0) Severe impairment-Cannot perform without assistance. Uses walker  7.   GAIT WITH NARROW BASE OF SUPPORT Instructions: Walk on the floor with arms folded across the chest, feet aligned heel to toe in tandem for a distance of 3.6 m [12 ft]. The number of steps taken in a straight line are counted for a maximum of 10 steps. (3) Normal - Is able to ambulate for 10 steps heel to toe with no staggering.  8.   GAIT WITH EYES CLOSED Instructions: Walk at your normal speed from here to the next mark (6 m [20 ft]) with your eyes closed. (2) Mild impairment - Walks 6 m (20 ft), uses assistive device, slower speed, mild gait deviations, deviates 15.24 -25.4 cm (6 -10 in) outside 30.48-cm (12-in) walkway width. Ambulates 6 m (20 ft) in less than 9 seconds but greater than 7 seconds  9.   AMBULATING BACKWARDS Instructions: Walk backwards until I tell you to stop (2) Mild impairment - Walks 6 m (20 ft), uses assistive device, slower speed, mild gait deviations, deviates 15.24 -25.4 cm (6 -10 in) outside 30.48-cm (12-in) walkway width  10. STEPS Instructions: Walk up these stairs as you would at home (ie, using the rail if necessary). At the top turn around and walk down. (1) Moderate impairment-Two feet to a stair; must use rail.  Total 19/30   Interpretation of scores: Non-Specific Older Adults Cutoff Score: <=22/30 = risk of falls Parkinson's Disease Cutoff score <15/30= fall risk (Hoehn & Yahr 1-4)  Minimally Clinically Important Difference (MCID)  Stroke (acute, subacute, and  chronic) = MDC: 4.2 points Vestibular (acute) = MDC: 6 points Community Dwelling Older Adults =  MCID: 4 points Parkinson's Disease  =  MDC: 4.3 points  (Academy of Neu[employer]ic Physical Therapy (nd). Functional Gait Assessment. Retrieved from https://www.neuropt.org/docs/default-source/cpgs/core-outcome-measures/function-gait-assessment-pocket-guide-proof9-(2).pdf?sfvrsn=b46f35043_0.)   GAIT: Distance walked: Into clinic Assistive device utilized: Walker - 2 wheeled; able to amb without a/d with good stability but with some gait impairments  Level of assistance: Modified independence Comments: Reciprocal gait pattern, L knee in flexion during stance phase/initial swing                                                                                                                                TREATMENT DATE:  04/16/24 therex Recumbent bike L4 x 10 min fwd and bwd hamstring stretch 3 x 30 Seated  heel prop in 2nd chair with quad sets 5 sec X 15 Seated knee flexion stretch AAROM 5 sec X 10  Theractivity Stairs reciprocal X 5 reps over and back Sit to stands, no UE support X 10 reps Leg press 40# 2X10 Leg extension 10# 2X10 Hamstring curl machine 25# 2X10  04/14/24 therex Recumbent bike L4 x 10 min fwd and bwd hamstring stretch 2x 30 Supine heel prop with quad sets 5 sec X 15 Updated measurements  Theractivity Step ups no UE support 6 inch step X 10 leading with left Stairs reciprocal X 5 reps over and back Leg press 40# 2X10 Leg extension 10# 2X10 Hamstring curl machine 25# 2X10       PATIENT EDUCATION:  Education details: Exam findings, POC, HEP update with theraband, compression sock wear time, CPM Person educated: Patient Education method: Explanation and Demonstration Education comprehension: verbalized understanding, returned demonstration, and needs further education  HOME EXERCISE PROGRAM: Access Code: [email or web address] URL:  [email or web address] Date: 03/17/2024 Prepared by: Gellen April Earnie Starring  Exercises - Seated Knee Extension with Resistance  - 1 x daily - 7 x weekly - 2 sets - 10 reps - 3 sec hold - Seated Hamstring Curls with Resistance  - 1 x daily - 7 x weekly - 2 sets - 10 reps - 3 sec hold - Standing Hip Abduction with Resistance at Ankles and Counter Support  - 1 x daily - 7 x weekly - 2 sets - 10 reps - Standing Hip Extension with Resistance at Ankles and Counter Support  - 1 x daily - 7 x weekly - 2 sets - 10 reps - Sit to Stand Without Arm Support  - 1 x daily - 7 x weekly - 2 sets - 10 reps - Seated Knee Flexion Stretch  - 1 x daily - 7 x weekly - 2 sets - 30 sec hold - Seated Hamstring Stretch  - 1 x daily - 7 x weekly - 2 sets - 30 sec hold  ASSESSMENT:  CLINICAL IMPRESSION: He continues to do well with his knee rehab. Recommend one more week of PT at his request and he should have met all goals and be ready to discharge after that.    From eval: Patient is a 83 y.o. M who was seen today for physical therapy evaluation and treatment s/p L TKA on 03/04/24. Pt is about 2 weeks post op and doing very well. Assessment demos decreased L knee ROM but already passed 90 deg of flexion and working mostly towards extension, slightly decreased strength compared to R but overall good stability with static standing. He is currently a low fall risk based on his Berg balance score but pt's FGA score currently places him at a fall risk only due to pt using RW during testing for safety. Despite this, pt was able to amb without a/d with good stability even with his gait abnormalities. Only time of true instability noted was during stair descent due to decreased eccentric quad strength.  Pt will benefit from PT to improve on these deficits and return to prior level of function.  OBJECTIVE IMPAIRMENTS: Abnormal gait, decreased activity tolerance, decreased balance, decreased endurance, decreased  mobility, difficulty walking, decreased ROM, decreased strength, hypomobility, increased edema, increased fascial restrictions, increased muscle spasms, impaired flexibility, improper body mechanics, and pain.   ACTIVITY LIMITATIONS: carrying, lifting, bending, sitting, standing, squatting, stairs, transfers, and locomotion level  PARTICIPATION LIMITATIONS: meal prep, cleaning, laundry, driving, shopping, community activity, occupation, and yard work  PERSONAL FACTORS: Age, Fitness, Past/current experiences, and Time since onset of injury/illness/exacerbation are also affecting patient's functional outcome.   REHAB POTENTIAL: Good  CLINICAL DECISION MAKING: Evolving/moderate complexity  EVALUATION COMPLEXITY: Moderate   GOALS: Goals reviewed with patient? Yes  SHORT TERM GOALS: Target date: 04/14/2024  Pt will be ind with initial HEP Baseline: 04/02/24: pt has been compliant. HEP continues to be updated.  Goal status: MET 04/14/24  2.  Pt will demo 5-110 deg of L knee AROM for improved gait and stair ascent/descent Baseline: L knee AROM 14-103 04/02/24: 3-110 deg AROM  Goal status: MET 04/14/24  3.  Pt will be able to amb independently with normal reciprocal pattern for at least 400' for limited community distances Baseline: Currently using RW and/or cane 04/02/24: Using no a/d in the house Goal status: MET 04/14/24   LONG TERM GOALS: Target date: 05/12/2024   Pt will be ind with management and progression of HEP Baseline:  Goal status: INITIAL  2.  Pt will demo improved L knee ROM to at least 5-115 deg for improved gait and stair ascent/descent Baseline: L knee AROM 14-103 Goal status: MET 04/16/24  3.  Pt will be able to amb independently with normal reciprocal pattern and walk at least 1/2 a mile Baseline:  Goal status: MET 04/16/24  4.  Pt will be able to ascend/descend ten 6 steps in reciprocal pattern without handrail to demo increased eccentric quad  strength/control Baseline:  Goal status: MET 04/16/24  5.  Pt will have improved FGA to >/=28/30 to demo low fall risk Baseline: 19 Goal status: INITIAL     PLAN:  PT FREQUENCY: 2x/week  PT DURATION: 8 weeks  PLANNED INTERVENTIONS: 97164- PT Re-evaluation, 97750- Physical Performance Testing, 97110-Therapeutic exercises, 97530- Therapeutic activity, 97112- Neuromuscular re-education, 97535- Self Care, [id number]- Manual therapy, [id number]- Gait training, [phone]- Aquatic Therapy, [phone]- Electrical stimulation (unattended), 97016- Vasopneumatic device, [id number]- Ionotophoresis 4mg /ml Dexamethasone , 79439 (1-2 muscles), 20561 (3+ muscles)- Dry Needling, Patient/Family education, Balance training, Stair training, Taping, Joint mobilization, Scar mobilization, Cryotherapy, and Moist heat  PLAN FOR NEXT SESSION:  2 visits left to prepare for discharge with HEP progressions.    Redell JONELLE Moose, PT, DPT 04/16/2024, 12:06 PM

## 2024-04-21 ENCOUNTER — Encounter: Payer: Self-pay | Admitting: Physical Therapy

## 2024-04-21 ENCOUNTER — Ambulatory Visit: Admitting: Physical Therapy

## 2024-04-21 DIAGNOSIS — D649 Anemia, unspecified: Secondary | ICD-10-CM | POA: Diagnosis not present

## 2024-04-21 DIAGNOSIS — Z6824 Body mass index (BMI) 24.0-24.9, adult: Secondary | ICD-10-CM | POA: Diagnosis not present

## 2024-04-21 DIAGNOSIS — Z96652 Presence of left artificial knee joint: Secondary | ICD-10-CM | POA: Diagnosis not present

## 2024-04-21 DIAGNOSIS — M25562 Pain in left knee: Secondary | ICD-10-CM

## 2024-04-21 DIAGNOSIS — Z800 Family history of malignant neoplasm of digestive organs: Secondary | ICD-10-CM | POA: Diagnosis not present

## 2024-04-21 DIAGNOSIS — E039 Hypothyroidism, unspecified: Secondary | ICD-10-CM | POA: Diagnosis not present

## 2024-04-21 DIAGNOSIS — M25662 Stiffness of left knee, not elsewhere classified: Secondary | ICD-10-CM | POA: Diagnosis not present

## 2024-04-21 DIAGNOSIS — M6281 Muscle weakness (generalized): Secondary | ICD-10-CM

## 2024-04-21 DIAGNOSIS — R2689 Other abnormalities of gait and mobility: Secondary | ICD-10-CM

## 2024-04-21 DIAGNOSIS — Z0001 Encounter for general adult medical examination with abnormal findings: Secondary | ICD-10-CM | POA: Diagnosis not present

## 2024-04-21 NOTE — Therapy (Signed)
 OUTPATIENT PHYSICAL THERAPY LOWER TREATMENT   Patient Name: [name] MRN: [id number] DOB:24-Mar-1941, 83 y.o., male Today's Date: 04/21/2024  END OF SESSION:  PT End of Session - 04/21/24 1105     Visit Number 8    Number of Visits 16    Date for Recertification  05/12/24    Authorization Type Medicare    Progress Note Due on Visit 10    PT Start Time 1055    PT Stop Time 1135    PT Time Calculation (min) 40 min    Activity Tolerance Patient tolerated treatment well    Behavior During Therapy [facility] for tasks assessed/performed                Past Medical History:  Diagnosis Date   Arthritis    History of colon polyps    removed with colonoscopy   History of kidney stones    Hypothyroidism    Seizures (HCC)    none in 20 yrs- controlled with meds(Dr. 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   Past Surgical History:  Procedure Laterality Date   CHOLECYSTECTOMY  07/25/2012   Procedure: LAPAROSCOPIC CHOLECYSTECTOMY WITH INTRAOPERATIVE CHOLANGIOGRAM;  Surgeon: Lynda Leos, MD;  Location: [facility];  Service: General;;   COLONOSCOPY  11/15/2011   Procedure: COLONOSCOPY;  Surgeon: Claudis RAYMOND Rivet, MD;  Location: [facility];  Service: Endoscopy;  Laterality: N/A;  730   COLONOSCOPY WITH PROPOFOL  N/A 07/11/2022   Procedure: COLONOSCOPY WITH PROPOFOL ;  Surgeon: Eartha Angelia Sieving, MD;  Location: [facility];  Service: Gastroenterology;  Laterality: N/A;  730 ASA 3   CYSTOSCOPY WITH LITHOLAPAXY  08/09/2023   Procedure: CYSTOSCOPY WITH LITHOLAPAXY;  Surgeon: Sherrilee Belvie CROME, MD;  Location: [facility];  Service: Urology;;   ELBOW SURGERY     chipped bone right elbow   HOLMIUM LASER APPLICATION  08/09/2023   Procedure: HOLMIUM LASER APPLICATION;  Surgeon: Sherrilee Belvie CROME, MD;  Location: [facility];  Service: Urology;;   POLYPECTOMY  07/11/2022   Procedure: POLYPECTOMY INTESTINAL;  Surgeon: Eartha Angelia Sieving, MD;  Location: [facility];  Service: Gastroenterology;;   TONSILLECTOMY     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   Procedure: ARTHROPLASTY, KNEE, TOTAL;  Surgeon: [name], MD;  Location: [facility];  Service: Orthopedics;  Laterality: Left;   TRANSURETHRAL RESECTION OF PROSTATE N/A 08/09/2023   Procedure: TRANSURETHRAL RESECTION OF THE PROSTATE (TURP);  Surgeon: Sherrilee Belvie CROME, MD;  Location: [facility];  Service: Urology;  Laterality: N/A;   VASECTOMY     Patient Active Problem List   Diagnosis Date Noted   Status post total left knee replacement 03/04/24 03/31/2024   Primary osteoarthritis of left knee 03/04/2024   Osteoarthritis of left knee 03/04/2024   Prostate nodule 11/13/2023   Family history of colon cancer 07/11/2022   History of colonic polyps 07/11/2022   Seizure (HCC) 06/13/2021   Nephrolithiasis 06/13/2021   Hypothyroidism 06/13/2021   Sciatica 06/13/2021   Benign prostatic hyperplasia with urinary obstruction 02/04/2021   Neuropathy, lower extremity 02/03/2019   Generalized idiopathic epilepsy and epileptic syndromes, not intractable, without status epilepticus (HCC) 08/18/2013   Rosacea 08/18/2013    PCP: [name], MD  REFERRING PROVIDER: [name], MD  REFERRING DIAG: [phone] (ICD-10-CM) - Unilateral primary osteoarthritis, left knee  THERAPY DIAG:  Stiffness of left knee, not elsewhere classified  Other abnormalities of gait and mobility  Muscle weakness (generalized)  Acute pain of left knee  Rationale for Evaluation and Treatment: Rehabilitation  ONSET DATE: 03/04/24 L TKA  SUBJECTIVE:   SUBJECTIVE STATEMENT: He reports his knee is doing good, not much pain.   PERTINENT HISTORY: Seizure disorder  PAIN:  Are you having pain? Yes: NPRS scale: denies pain Pain location: sides of knee into muscle Pain description: aching Aggravating factors: prolonged activity Relieving factors:  icing  PRECAUTIONS: None  RED FLAGS: None   WEIGHT BEARING RESTRICTIONS: No  FALLS:  Has patient fallen in last 6 months? 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  DIAGNOSTIC FINDINGS: nothing recent since surgery on 03/04/24  PATIENT SURVEYS:  Lower Extremity Functional Score: 48 / 80 = 60.0 %  COGNITION: Overall cognitive status: Within functional limits for tasks assessed     SENSATION: WFL  EDEMA:  Mild edema on L  MUSCLE LENGTH: Hamstrings: Right ~60 deg; Left ~45 deg Thomas test: WNL  POSTURE: forward head  PALPATION: TTP along insertion of L adductors, proximal quad and ITB  LOWER EXTREMITY ROM:  Active ROM Right eval Left eval Left 03/26/24 Left 04/02/24 Left 9/22/5  Hip flexion       Hip extension       Hip abduction       Hip adduction       Hip internal rotation       Hip external rotation       Knee flexion 120 103 110 AROM: 110 PROM: 115 A:118 P:12-  Knee extension -5 -14 -3 AROM: -3 PROM: 0 AROM -3  Ankle dorsiflexion       Ankle plantarflexion       Ankle inversion       Ankle eversion        (Blank rows = not tested)  LOWER EXTREMITY MMT:  MMT Right eval Left eval Left 04/14/24  Hip flexion 5 4-   Hip extension  4-   Hip abduction 4- 4-   Hip adduction     Hip internal rotation     Hip external rotation     Knee flexion 4- 4- 5  Knee extension 5 4- 5  Ankle dorsiflexion     Ankle plantarflexion     Ankle inversion     Ankle eversion      (Blank rows = not tested)  LOWER EXTREMITY SPECIAL TESTS:  Did  not assess  FUNCTIONAL TESTS:  5 times sit to stand: 11.58 sec Item Test date: 03/17/24 Test date:  Test date:   Sitting to standing 4. able to stand without using hands and stabilize independently Insert OPRCBERGREEVAL SmartPhrase at re-test date Insert OPRCBERGREEVAL SmartPhrase at re-test date  2. Standing unsupported 4. able to stand safely for 2 minutes    3. Sitting with back unsupported, feet supported 4. able to sit safely and securely for 2 minutes    4. Standing to sitting 4. sits safely with minimal use of hands    5. Pivot transfer  4. able to transfer safely with minor use of hands    6. Standing  unsupported with eyes closed 4. able to stand 10 seconds safely    7. Standing unsupported with feet together 4. able to place feet together independently and stand 1 minute safely    8. Reaching forward with outstretched arms while standing 4. can reach forward confidently 25 cm (10 inches)    9. Pick up object from the floor from standing 4. able to pick up slipper safely and easily    10. Turning to look behind over left and right shoulders while standing 4. looks behind from both sides and weight shifts well    11. Turn 360 degrees 2. able to turn 360 degrees safely but slowly    12. Place alternate foot on step or stool while standing unsupported 4. ble to stand independently and safely and complete 8 steps in 20 seconds    13. Standing unsupported one foot in front 4. able to place foot tandem independently and hold 30 seconds    14. Standing on one leg 4. able to lift leg independently and hold > 10 seconds     Total Score 56/56 Total Score /56 Total Score /56    FUNCTIONAL GAIT ASSESSMENT   Date: 03/17/24 Score 9/29/  GAIT LEVEL SURFACE Instructions: Walk at your normal speed from here to the next mark (6 m) [20 ft]. (2) Mild impairment - Walks 6 m (20 ft) in less than 7 seconds but greater than 5.5 seconds, uses assistive device, slower speed, mild gait deviations, or deviates  15.24 -25.4 cm (6 -10 in) outside of the 30.48-cm (12-in) walkway width. 3  2.   CHANGE IN GAIT SPEED Instructions: Begin walking at your normal pace (for 1.5 m [5 ft]). When I tell you "go," walk as fast as you can (for 1.5 m [5 ft]). When I tell you "slow," walk as slowly as you can (for 1.5 m [5 ft]. (2) Mild impairment - Is able to change speed but demonstrates mild gait deviations, deviates 15.24 -25.4 cm (6 -10 in) outside of the 30.48-cm (12-in) walkway width, or no gait deviations but unable to achieve a significant change in velocity, or uses an assistive device 3  3.    GAIT WITH HORIZONTAL HEAD TURNS Instructions: Walk from here to the next mark 6 m (20 ft) away. Begin walking at your normal pace. Keep walking straight; after 3 steps, turn your head to the right and keep walking straight while looking to the right. After 3 more steps, turn your head to the left and keep walking straight while looking left. Continue alternating looking right and left. (2) Mild impairment - Performs head turns smoothly with slight change in gait velocity (eg, minor disruption to smooth gait path), deviates 15.24 -25.4 cm (6 -10 in) outside 30.48-cm (12-in) walkway width, or uses an assistive device. 3  4.   GAIT WITH VERTICAL HEAD TURNS Instructions: Walk from here to the next mark (6 m [20 ft]). Begin walking at your normal pace. Keep walking straight; after 3 steps, tip your head up and keep walking straight while looking up. After 3 more steps, tip your head down, keep walking straight while looking down. Continue  alternating looking up and down every 3 steps until you have completed 2 repetitions in each direction. (2) Mild impairment - Performs task with slight change in gait velocity (eg, minor disruption to smooth gait path), deviates 15.24 -25.4 cm (6 -10 in) outside 30.48-cm (12-in) walkway width or uses assistive device. 3  5.  GAIT AND  PIVOT TURN Instructions: Begin with walking at your normal pace. When  I tell you, "turn and stop," turn as quickly as you can to face the opposite direction and stop. (3) Normal - Pivot turns safely within 3 seconds and stops quickly with no loss of balance 3  6.   STEP OVER OBSTACLE Instructions: Begin walking at your normal speed. When you come to the shoe box, step over it, not around it, and keep walking. (0) Severe impairment-Cannot perform without assistance. Uses walker 3  7.   GAIT WITH NARROW BASE OF SUPPORT Instructions: Walk on the floor with arms folded across the chest, feet aligned heel to toe in tandem for a distance of 3.6 m [12 ft]. The number of steps taken in a straight line are counted for a maximum of 10 steps. (3) Normal - Is able to ambulate for 10 steps heel to toe with no staggering. 3  8.   GAIT WITH EYES CLOSED Instructions: Walk at your normal speed from here to the next mark (6 m [20 ft]) with your eyes closed. (2) Mild impairment - Walks 6 m (20 ft), uses assistive device, slower speed, mild gait deviations, deviates 15.24 -25.4 cm (6 -10 in) outside 30.48-cm (12-in) walkway width. Ambulates 6 m (20 ft) in less than 9 seconds but greater than 7 seconds 2  9.   AMBULATING BACKWARDS Instructions: Walk backwards until I tell you to stop (2) Mild impairment - Walks 6 m (20 ft), uses assistive device, slower speed, mild gait deviations, deviates 15.24 -25.4 cm (6 -10 in) outside 30.48-cm (12-in) walkway width 3  10. STEPS Instructions: Walk up these stairs as you would at home (ie, using the rail if necessary). At the top turn around and walk down. (1) Moderate impairment-Two feet to a stair; must use rail. 3  Total 19/30 29/30   Interpretation of scores: Non-Specific Older Adults Cutoff Score: <=22/30 = risk of falls Parkinson's Disease Cutoff score <15/30= fall risk (Hoehn & Yahr 1-4)  Minimally Clinically Important Difference (MCID)  Stroke (acute, subacute, and chronic) = MDC: 4.2 points Vestibular (acute) = MDC: 6 points Community  Dwelling Older Adults =  MCID: 4 points Parkinson's Disease  =  MDC: 4.3 points  (Academy of Neu[employer]ic Physical Therapy (nd). Functional Gait Assessment. Retrieved from https://www.neuropt.org/docs/default-source/cpgs/core-outcome-measures/function-gait-assessment-pocket-guide-proof9-(2).pdf?sfvrsn=b38f35043_0.)   GAIT: Distance walked: Into clinic Assistive device utilized: Walker - 2 wheeled; able to amb without a/d with good stability but with some gait impairments  Level of assistance: Modified independence Comments: Reciprocal gait pattern, L knee in flexion during stance phase/initial swing                                                                                                                                TREATMENT DATE:  04/21/24 therex Recumbent bike L4 x 10 min fwd and bwd hamstring stretch 3 x 30 Seated heel prop in 2nd chair with quad sets 5 sec X  15 Standing knee flexion stretch AAROM 5 sec X 10  Theractivity Stairs reciprocal X 5 reps over and back Sit to stands, no UE support X 10 reps Leg press 45# 2X10, ten 30# left leg only X 10 Leg extension 15# 2X10, then 10# left leg only X 10 Hamstring curl machine 25# 2X10, then 15# left leg only X 10 Functional gait assessment, see above for details (scored 29/30)  04/16/24 therex Recumbent bike L4 x 10 min fwd and bwd hamstring stretch 3 x 30 Seated heel prop in 2nd chair with quad sets 5 sec X 15 Seated knee flexion stretch AAROM 5 sec X 10  Theractivity Stairs reciprocal X 5 reps over and back Sit to stands, no UE support X 10 reps Leg press 40# 2X10 Leg extension 10# 2X10 Hamstring curl machine 25# 2X10      PATIENT EDUCATION:  Education details: Exam findings, POC, HEP update with theraband, compression sock wear time, CPM Person educated: Patient Education method: Explanation and Demonstration Education comprehension: verbalized understanding, returned demonstration, and needs further  education  HOME EXERCISE PROGRAM: Access Code: [email or web address] URL: [email or web address] Date: 03/17/2024 Prepared by: Gellen April Earnie Starring  Exercises - Seated Knee Extension with Resistance  - 1 x daily - 7 x weekly - 2 sets - 10 reps - 3 sec hold - Seated Hamstring Curls with Resistance  - 1 x daily - 7 x weekly - 2 sets - 10 reps - 3 sec hold - Standing Hip Abduction with Resistance at Ankles and Counter Support  - 1 x daily - 7 x weekly - 2 sets - 10 reps - Standing Hip Extension with Resistance at Ankles and Counter Support  - 1 x daily - 7 x weekly - 2 sets - 10 reps - Sit to Stand Without Arm Support  - 1 x daily - 7 x weekly - 2 sets - 10 reps - Seated Knee Flexion Stretch  - 1 x daily - 7 x weekly - 2 sets - 30 sec hold - Seated Hamstring Stretch  - 1 x daily - 7 x weekly - 2 sets - 30 sec hold  ASSESSMENT:  CLINICAL IMPRESSION: He has done great and has one more visit left and should be ready to discharge early as long as there are no setbacks.   From eval: Patient is a 83 y.o. M who was seen today for physical therapy evaluation and treatment s/p L TKA on 03/04/24. Pt is about 2 weeks post op and doing very well. Assessment demos decreased L knee ROM but already passed 90 deg of flexion and working mostly towards extension, slightly decreased strength compared to R but overall good stability with static standing. He is currently a low fall risk based on his Berg balance score but pt's FGA score currently places him at a fall risk only due to pt using RW during testing for safety. Despite this, pt was able to amb without a/d with good stability even with his gait abnormalities. Only time of true instability noted was during stair descent due to decreased eccentric quad strength.  Pt will benefit from PT to improve on these deficits and return to prior level of function.  OBJECTIVE IMPAIRMENTS: Abnormal gait, decreased activity tolerance, decreased balance, decreased  endurance, decreased mobility, difficulty walking, decreased ROM, decreased strength, hypomobility, increased edema, increased fascial restrictions, increased muscle spasms, impaired flexibility, improper body mechanics, and pain.   ACTIVITY LIMITATIONS: carrying, lifting, bending, sitting, standing, squatting, stairs, transfers, and locomotion  level  PARTICIPATION LIMITATIONS: meal prep, cleaning, laundry, driving, shopping, community activity, occupation, and yard work  PERSONAL FACTORS: Age, Fitness, Past/current experiences, and Time since onset of injury/illness/exacerbation are also affecting patient's functional outcome.   REHAB POTENTIAL: Good  CLINICAL DECISION MAKING: Evolving/moderate complexity  EVALUATION COMPLEXITY: Moderate   GOALS: Goals reviewed with patient? Yes  SHORT TERM GOALS: Target date: 04/14/2024  Pt will be ind with initial HEP Baseline: 04/02/24: pt has been compliant. HEP continues to be updated.  Goal status: MET 04/14/24  2.  Pt will demo 5-110 deg of L knee AROM for improved gait and stair ascent/descent Baseline: L knee AROM 14-103 04/02/24: 3-110 deg AROM  Goal status: MET 04/14/24  3.  Pt will be able to amb independently with normal reciprocal pattern for at least 400' for limited community distances Baseline: Currently using RW and/or cane 04/02/24: Using no a/d in the house Goal status: MET 04/14/24   LONG TERM GOALS: Target date: 05/12/2024   Pt will be ind with management and progression of HEP Baseline:  Goal status: MET 04/21/24  2.  Pt will demo improved L knee ROM to at least 5-115 deg for improved gait and stair ascent/descent Baseline: L knee AROM 14-103 Goal status: MET 04/16/24  3.  Pt will be able to amb independently with normal reciprocal pattern and walk at least 1/2 a mile Baseline:  Goal status: MET 04/16/24  4.  Pt will be able to ascend/descend ten 6 steps in reciprocal pattern without handrail to demo increased  eccentric quad strength/control Baseline:  Goal status: MET 04/16/24  5.  Pt will have improved FGA to >/=28/30 to demo low fall risk Baseline: 19 Goal status: MET 04/21/24, improved to 29     PLAN:  PT FREQUENCY: 2x/week  PT DURATION: 8 weeks  PLANNED INTERVENTIONS: 97164- PT Re-evaluation, 97750- Physical Performance Testing, 97110-Therapeutic exercises, 97530- Therapeutic activity, 97112- Neuromuscular re-education, 97535- Self Care, [id number]- Manual therapy, [id number]- Gait training, [phone]- Aquatic Therapy, [phone]- Electrical stimulation (unattended), 97016- Vasopneumatic device, [id number]- Ionotophoresis 4mg /ml Dexamethasone , 79439 (1-2 muscles), 20561 (3+ muscles)- Dry Needling, Patient/Family education, Balance training, Stair training, Taping, Joint mobilization, Scar mobilization, Cryotherapy, and Moist heat  PLAN FOR NEXT SESSION:  DC next visit due to progress.   Redell JONELLE Moose, PT, DPT 04/21/2024, 11:06 AM

## 2024-04-23 ENCOUNTER — Encounter: Payer: Self-pay | Admitting: Physical Therapy

## 2024-04-23 ENCOUNTER — Ambulatory Visit: Attending: Orthopedic Surgery | Admitting: Physical Therapy

## 2024-04-23 DIAGNOSIS — M25662 Stiffness of left knee, not elsewhere classified: Secondary | ICD-10-CM | POA: Diagnosis not present

## 2024-04-23 DIAGNOSIS — M6281 Muscle weakness (generalized): Secondary | ICD-10-CM | POA: Diagnosis not present

## 2024-04-23 DIAGNOSIS — M25562 Pain in left knee: Secondary | ICD-10-CM | POA: Diagnosis not present

## 2024-04-23 DIAGNOSIS — R2689 Other abnormalities of gait and mobility: Secondary | ICD-10-CM | POA: Insufficient documentation

## 2024-04-23 NOTE — Therapy (Signed)
 OUTPATIENT PHYSICAL THERAPY LOWER TREATMENT   Patient Name: [name] MRN: [id number] DOB:[birth year], 83 y.o., male Today's Date: 04/23/2024  END OF SESSION:  PT End of Session - 04/23/24 0940     Visit Number 9    Number of Visits 16    Date for Recertification  05/12/24    Authorization Type Medicare    Progress Note Due on Visit 10    PT Start Time 0926    PT Stop Time 1006    PT Time Calculation (min) 40 min    Activity Tolerance Patient tolerated treatment well    Behavior During Therapy [facility] for tasks assessed/performed                Past Medical History:  Diagnosis Date   Arthritis    History of colon polyps    removed with colonoscopy   History of kidney stones    Hypothyroidism    Seizures (HCC)    none in 20 yrs- controlled with meds(Dr. 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   Past Surgical History:  Procedure Laterality Date   CHOLECYSTECTOMY  07/25/2012   Procedure: LAPAROSCOPIC CHOLECYSTECTOMY WITH INTRAOPERATIVE CHOLANGIOGRAM;  Surgeon: Lynda Leos, MD;  Location: [facility];  Service: General;;   COLONOSCOPY  11/15/2011   Procedure: COLONOSCOPY;  Surgeon: Claudis RAYMOND Rivet, MD;  Location: [facility];  Service: Endoscopy;  Laterality: N/A;  730   COLONOSCOPY WITH PROPOFOL  N/A 07/11/2022   Procedure: COLONOSCOPY WITH PROPOFOL ;  Surgeon: Eartha Angelia Sieving, MD;  Location: [facility];  Service: Gastroenterology;  Laterality: N/A;  730 ASA 3   CYSTOSCOPY WITH LITHOLAPAXY  08/09/2023   Procedure: CYSTOSCOPY WITH LITHOLAPAXY;  Surgeon: Sherrilee Belvie CROME, MD;  Location: [facility];  Service: Urology;;   ELBOW SURGERY     chipped bone right elbow   HOLMIUM LASER APPLICATION  08/09/2023   Procedure: HOLMIUM LASER APPLICATION;  Surgeon: Sherrilee Belvie CROME, MD;  Location: [facility];  Service: Urology;;   POLYPECTOMY  07/11/2022   Procedure: POLYPECTOMY INTESTINAL;  Surgeon: Eartha Angelia Sieving, MD;  [facility]: [facility];  Service: Gastroenterology;;   TONSILLECTOMY     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   Procedure: ARTHROPLASTY, KNEE, TOTAL;  Surgeon: [name], MD;  Location: [facility];  Service: Orthopedics;  Laterality: Left;   TRANSURETHRAL RESECTION OF PROSTATE N/A 08/09/2023   Procedure: TRANSURETHRAL RESECTION OF THE PROSTATE (TURP);  Surgeon: Sherrilee Belvie CROME, MD;  Location: [facility];  Service: Urology;  Laterality: N/A;   VASECTOMY     Patient Active Problem List   Diagnosis Date Noted   Status post total left knee replacement 03/04/24 03/31/2024   Primary osteoarthritis of left knee 03/04/2024   Osteoarthritis of left knee 03/04/2024   Prostate nodule 11/13/2023   Family history of colon cancer 07/11/2022   History of colonic polyps 07/11/2022   Seizure (HCC) 06/13/2021   Nephrolithiasis 06/13/2021   Hypothyroidism 06/13/2021   Sciatica 06/13/2021   Benign prostatic hyperplasia with urinary obstruction 02/04/2021   Neuropathy, lower extremity 02/03/2019   Generalized idiopathic epilepsy and epileptic syndromes, not intractable, without status epilepticus (HCC) 08/18/2013   Rosacea 08/18/2013    PCP: [name], MD  REFERRING PROVIDER: [name], MD  REFERRING DIAG: [phone] (ICD-10-CM) - Unilateral primary osteoarthritis, left knee  THERAPY DIAG:  Stiffness of left knee, not elsewhere classified  Other abnormalities of gait and mobility  Muscle weakness (generalized)  Acute pain of left knee  Rationale for Evaluation and Treatment: Rehabilitation  ONSET DATE: 03/04/24 L TKA  SUBJECTIVE:   SUBJECTIVE STATEMENT: He reports his knee is doing well and he feels ready to finish up with PT today.   PERTINENT HISTORY: Seizure disorder  PAIN:  Are you having pain? Yes: NPRS scale: denies pain Pain location: sides of knee into muscle Pain description: aching Aggravating factors: prolonged  activity Relieving factors: icing  PRECAUTIONS: None  RED FLAGS: None   WEIGHT BEARING RESTRICTIONS: No  FALLS:  Has patient fallen in last 6 months? 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  DIAGNOSTIC FINDINGS: nothing recent since surgery on 03/04/24  PATIENT SURVEYS:  Lower Extremity Functional Score: 48 / 80 = 60.0 %  COGNITION: Overall cognitive status: Within functional limits for tasks assessed     SENSATION: WFL  EDEMA:  Mild edema on L  MUSCLE LENGTH: Hamstrings: Right ~60 deg; Left ~45 deg Thomas test: WNL  POSTURE: forward head  PALPATION: TTP along insertion of L adductors, proximal quad and ITB  LOWER EXTREMITY ROM:  Active ROM Right eval Left eval Left 03/26/24 Left 04/02/24 Left 9/22/5  Hip flexion       Hip extension       Hip abduction       Hip adduction       Hip internal rotation       Hip external rotation       Knee flexion 120 103 110 AROM: 110 PROM: 115 A:118 P:12-  Knee extension -5 -14 -3 AROM: -3 PROM: 0 AROM -3  Ankle dorsiflexion       Ankle plantarflexion       Ankle inversion       Ankle eversion        (Blank rows = not tested)  LOWER EXTREMITY MMT:  MMT Right eval Left eval Left 04/14/24  Hip flexion 5 4-   Hip extension  4-   Hip abduction 4- 4-   Hip adduction     Hip internal rotation     Hip external rotation     Knee flexion 4- 4- 5  Knee extension 5 4- 5  Ankle dorsiflexion     Ankle plantarflexion     Ankle inversion     Ankle eversion      (Blank rows = not tested)  LOWER  EXTREMITY SPECIAL TESTS:  Did not assess  FUNCTIONAL TESTS:  5 times sit to stand: 11.58 sec Item Test date: 03/17/24 Test date:  Test date:   Sitting to standing 4. able to stand without using hands and stabilize independently Insert OPRCBERGREEVAL SmartPhrase at re-test date Insert OPRCBERGREEVAL SmartPhrase at re-test date  2. Standing unsupported 4. able to stand safely for 2 minutes    3. Sitting with back unsupported, feet supported 4. able to sit safely and securely for 2 minutes    4. Standing to sitting 4. sits safely with minimal use of hands    5. Pivot transfer  4. able to transfer safely with minor use  of hands    6. Standing unsupported with eyes closed 4. able to stand 10 seconds safely    7. Standing unsupported with feet together 4. able to place feet together independently and stand 1 minute safely    8. Reaching forward with outstretched arms while standing 4. can reach forward confidently 25 cm (10 inches)    9. Pick up object from the floor from standing 4. able to pick up slipper safely and easily    10. Turning to look behind over left and right shoulders while standing 4. looks behind from both sides and weight shifts well    11. Turn 360 degrees 2. able to turn 360 degrees safely but slowly    12. Place alternate foot on step or stool while standing unsupported 4. ble to stand independently and safely and complete 8 steps in 20 seconds    13. Standing unsupported one foot in front 4. able to place foot tandem independently and hold 30 seconds    14. Standing on one leg 4. able to lift leg independently and hold > 10 seconds     Total Score 56/56 Total Score /56 Total Score /56    FUNCTIONAL GAIT ASSESSMENT   Date: 03/17/24 Score 9/29/  GAIT LEVEL SURFACE Instructions: Walk at your normal speed from here to the next mark (6 m) [20 ft]. (2) Mild impairment - Walks 6 m (20 ft) in less than 7 seconds but greater than 5.5 seconds, uses assistive device, slower speed, mild  gait deviations, or deviates 15.24 -25.4 cm (6 -10 in) outside of the 30.48-cm (12-in) walkway width. 3  2.   CHANGE IN GAIT SPEED Instructions: Begin walking at your normal pace (for 1.5 m [5 ft]). When I tell you "go," walk as fast as you can (for 1.5 m [5 ft]). When I tell you "slow," walk as slowly as you can (for 1.5 m [5 ft]. (2) Mild impairment - Is able to change speed but demonstrates mild gait deviations, deviates 15.24 -25.4 cm (6 -10 in) outside of the 30.48-cm (12-in) walkway width, or no gait deviations but unable to achieve a significant change in velocity, or uses an assistive device 3  3.    GAIT WITH HORIZONTAL HEAD TURNS Instructions: Walk from here to the next mark 6 m (20 ft) away. Begin walking at your normal pace. Keep walking straight; after 3 steps, turn your head to the right and keep walking straight while looking to the right. After 3 more steps, turn your head to the left and keep walking straight while looking left. Continue alternating looking right and left. (2) Mild impairment - Performs head turns smoothly with slight change in gait velocity (eg, minor disruption to smooth gait path), deviates 15.24 -25.4 cm (6 -10 in) outside 30.48-cm (12-in) walkway width, or uses an assistive device. 3  4.   GAIT WITH VERTICAL HEAD TURNS Instructions: Walk from here to the next mark (6 m [20 ft]). Begin walking at your normal pace. Keep walking straight; after 3 steps, tip your head up and keep walking straight while looking up. After 3 more steps, tip your head down, keep walking straight while looking down. Continue  alternating looking up and down every 3 steps until you have completed 2 repetitions in each direction. (2) Mild impairment - Performs task with slight change in gait velocity (eg, minor disruption to smooth gait path), deviates 15.24 -25.4 cm (6 -10 in) outside 30.48-cm (12-in) walkway width or uses assistive  device. 3  5.  GAIT AND PIVOT TURN Instructions: Begin with  walking at your normal pace. When I tell you, "turn and stop," turn as quickly as you can to face the opposite direction and stop. (3) Normal - Pivot turns safely within 3 seconds and stops quickly with no loss of balance 3  6.   STEP OVER OBSTACLE Instructions: Begin walking at your normal speed. When you come to the shoe box, step over it, not around it, and keep walking. (0) Severe impairment-Cannot perform without assistance. Uses walker 3  7.   GAIT WITH NARROW BASE OF SUPPORT Instructions: Walk on the floor with arms folded across the chest, feet aligned heel to toe in tandem for a distance of 3.6 m [12 ft]. The number of steps taken in a straight line are counted for a maximum of 10 steps. (3) Normal - Is able to ambulate for 10 steps heel to toe with no staggering. 3  8.   GAIT WITH EYES CLOSED Instructions: Walk at your normal speed from here to the next mark (6 m [20 ft]) with your eyes closed. (2) Mild impairment - Walks 6 m (20 ft), uses assistive device, slower speed, mild gait deviations, deviates 15.24 -25.4 cm (6 -10 in) outside 30.48-cm (12-in) walkway width. Ambulates 6 m (20 ft) in less than 9 seconds but greater than 7 seconds 2  9.   AMBULATING BACKWARDS Instructions: Walk backwards until I tell you to stop (2) Mild impairment - Walks 6 m (20 ft), uses assistive device, slower speed, mild gait deviations, deviates 15.24 -25.4 cm (6 -10 in) outside 30.48-cm (12-in) walkway width 3  10. STEPS Instructions: Walk up these stairs as you would at home (ie, using the rail if necessary). At the top turn around and walk down. (1) Moderate impairment-Two feet to a stair; must use rail. 3  Total 19/30 29/30   Interpretation of scores: Non-Specific Older Adults Cutoff Score: <=22/30 = risk of falls Parkinson's Disease Cutoff score <15/30= fall risk (Hoehn & Yahr 1-4)  Minimally Clinically Important Difference (MCID)  Stroke (acute, subacute, and chronic) = MDC: 4.2 points Vestibular  (acute) = MDC: 6 points Community Dwelling Older Adults =  MCID: 4 points Parkinson's Disease  =  MDC: 4.3 points  (Academy of Neu[employer]ic Physical Therapy (nd). Functional Gait Assessment. Retrieved from https://www.neuropt.org/docs/default-source/cpgs/core-outcome-measures/function-gait-assessment-pocket-guide-proof9-(2).pdf?sfvrsn=b85f35043_0.)   GAIT: Distance walked: Into clinic Assistive device utilized: Walker - 2 wheeled; able to amb without a/d with good stability but with some gait impairments  Level of assistance: Modified independence Comments: Reciprocal gait pattern, L knee in flexion during stance phase/initial swing                                                                                                                                TREATMENT DATE:  04/23/24 therex Recumbent bike L4 x 10 min fwd and bwd hamstring stretch 3 x 30 Seated heel prop in 2nd  chair with quad sets 5 sec X 15 Standing knee flexion stretch AAROM 5 sec X 10 HEP review  Theractivity Stairs reciprocal X 5 reps over and back Squats, no UE support X 10 reps Leg press 45# 2X10, then 30# left leg only X 10 Leg extension 15# 2X10, then 10# left leg only X 10 Hamstring curl machine 25# 2X10, then 15# left leg only X 10   PATIENT EDUCATION:  Education details: Exam findings, POC, HEP update with theraband, compression sock wear time, CPM Person educated: Patient Education method: Explanation and Demonstration Education comprehension: verbalized understanding, returned demonstration, and needs further education  HOME EXERCISE PROGRAM: Access Code: [email or web address] URL: [email or web address] Date: 03/17/2024 Prepared by: Gellen April Earnie Starring  Exercises - Seated Knee Extension with Resistance  - 1 x daily - 7 x weekly - 2 sets - 10 reps - 3 sec hold - Seated Hamstring Curls with Resistance  - 1 x daily - 7 x weekly - 2 sets - 10 reps - 3 sec hold - Standing Hip Abduction with  Resistance at Ankles and Counter Support  - 1 x daily - 7 x weekly - 2 sets - 10 reps - Standing Hip Extension with Resistance at Ankles and Counter Support  - 1 x daily - 7 x weekly - 2 sets - 10 reps - Sit to Stand Without Arm Support  - 1 x daily - 7 x weekly - 2 sets - 10 reps - Seated Knee Flexion Stretch  - 1 x daily - 7 x weekly - 2 sets - 30 sec hold - Seated Hamstring Stretch  - 1 x daily - 7 x weekly - 2 sets - 30 sec hold  ASSESSMENT:  CLINICAL IMPRESSION: He has met his PT goals. He is not having pain and feels ready to transition to independent program so he will be discharged today.    From eval: Patient is a 83 y.o. M who was seen today for physical therapy evaluation and treatment s/p L TKA on 03/04/24. Pt is about 2 weeks post op and doing very well. Assessment demos decreased L knee ROM but already passed 90 deg of flexion and working mostly towards extension, slightly decreased strength compared to R but overall good stability with static standing. He is currently a low fall risk based on his Berg balance score but pt's FGA score currently places him at a fall risk only due to pt using RW during testing for safety. Despite this, pt was able to amb without a/d with good stability even with his gait abnormalities. Only time of true instability noted was during stair descent due to decreased eccentric quad strength.  Pt will benefit from PT to improve on these deficits and return to prior level of function.  OBJECTIVE IMPAIRMENTS: Abnormal gait, decreased activity tolerance, decreased balance, decreased endurance, decreased mobility, difficulty walking, decreased ROM, decreased strength, hypomobility, increased edema, increased fascial restrictions, increased muscle spasms, impaired flexibility, improper body mechanics, and pain.   ACTIVITY LIMITATIONS: carrying, lifting, bending, sitting, standing, squatting, stairs, transfers, and locomotion level  PARTICIPATION LIMITATIONS: meal  prep, cleaning, laundry, driving, shopping, community activity, occupation, and yard work  PERSONAL FACTORS: Age, Fitness, Past/current experiences, and Time since onset of injury/illness/exacerbation are also affecting patient's functional outcome.   REHAB POTENTIAL: Good  CLINICAL DECISION MAKING: Evolving/moderate complexity  EVALUATION COMPLEXITY: Moderate   GOALS: Goals reviewed with patient? Yes  SHORT TERM GOALS: Target date: 04/14/2024  Pt will be ind with initial HEP Baseline:  04/02/24: pt has been compliant. HEP continues to be updated.  Goal status: MET 04/14/24  2.  Pt will demo 5-110 deg of L knee AROM for improved gait and stair ascent/descent Baseline: L knee AROM 14-103 04/02/24: 3-110 deg AROM  Goal status: MET 04/14/24  3.  Pt will be able to amb independently with normal reciprocal pattern for at least 400' for limited community distances Baseline: Currently using RW and/or cane 04/02/24: Using no a/d in the house Goal status: MET 04/14/24   LONG TERM GOALS: Target date: 05/12/2024   Pt will be ind with management and progression of HEP Baseline:  Goal status: MET 04/21/24  2.  Pt will demo improved L knee ROM to at least 5-115 deg for improved gait and stair ascent/descent Baseline: L knee AROM 14-103 Goal status: MET 04/16/24  3.  Pt will be able to amb independently with normal reciprocal pattern and walk at least 1/2 a mile Baseline:  Goal status: MET 04/16/24  4.  Pt will be able to ascend/descend ten 6 steps in reciprocal pattern without handrail to demo increased eccentric quad strength/control Baseline:  Goal status: MET 04/16/24  5.  Pt will have improved FGA to >/=28/30 to demo low fall risk Baseline: 19 Goal status: MET 04/21/24, improved to 29     PLAN:  PT FREQUENCY: 2x/week  PT DURATION: 8 weeks  PLANNED INTERVENTIONS: 97164- PT Re-evaluation, 97750- Physical Performance Testing, 97110-Therapeutic exercises, 97530- Therapeutic  activity, 97112- Neuromuscular re-education, 97535- Self Care, 02859- Manual therapy, [phone]- Gait training, [phone]- Aquatic Therapy, [phone]- Electrical stimulation (unattended), 97016- Vasopneumatic device, [id number]- Ionotophoresis 4mg /ml Dexamethasone , 79439 (1-2 muscles), 20561 (3+ muscles)- Dry Needling, Patient/Family education, Balance training, Stair training, Taping, Joint mobilization, Scar mobilization, Cryotherapy, and Moist heat  PLAN FOR NEXT SESSION:  DC today   Redell JONELLE Moose, PT, DPT 04/23/2024, 10:11 AM

## 2024-04-28 ENCOUNTER — Encounter: Admitting: Physical Therapy

## 2024-04-30 ENCOUNTER — Encounter: Admitting: Physical Therapy

## 2024-05-02 ENCOUNTER — Encounter: Admitting: Orthopedic Surgery

## 2024-05-05 ENCOUNTER — Encounter: Payer: Self-pay | Admitting: Orthopedic Surgery

## 2024-05-05 ENCOUNTER — Ambulatory Visit: Admitting: Orthopedic Surgery

## 2024-05-05 DIAGNOSIS — Z96652 Presence of left artificial knee joint: Secondary | ICD-10-CM

## 2024-05-05 NOTE — Progress Notes (Signed)
    05/05/2024   Chief Complaint  Patient presents with   Post-op Follow-up    Left knee     Encounter Diagnosis  Name Primary?   Status post total left knee replacement 03/04/24 Yes    What pharmacy do you use ? ____[facility]_______________________  DOI/DOS/ Date:    Improved

## 2024-05-05 NOTE — Progress Notes (Signed)
    05/05/2024   Chief Complaint  Patient presents with   Post-op Follow-up    Left knee     Encounter Diagnosis  Name Primary?   Status post total left knee replacement 03/04/24 Yes    What pharmacy do you use ? ____[facility]_______________________  DOI/DOS/ Date:    Osteoarthritis left knee 8-week follow-up  [name] is doing well.  He has a 0 to 3 degree flexion contracture in the left knee his range of motion is from 3 to 125 degrees With stability test normal Wound check normal No signs of DVT he does have bilateral peripheral edema  Patient will follow-up in a year for x-ray

## 2024-05-07 ENCOUNTER — Encounter (INDEPENDENT_AMBULATORY_CARE_PROVIDER_SITE_OTHER): Payer: Self-pay | Admitting: Gastroenterology

## 2024-05-20 ENCOUNTER — Ambulatory Visit (HOSPITAL_COMMUNITY)
Admission: RE | Admit: 2024-05-20 | Discharge: 2024-05-20 | Disposition: A | Discharge status: Home / Self Care | Source: Ambulatory Visit | Attending: Urology | Admitting: Urology

## 2024-05-20 DIAGNOSIS — N200 Calculus of kidney: Secondary | ICD-10-CM | POA: Insufficient documentation

## 2024-05-20 DIAGNOSIS — N281 Cyst of kidney, acquired: Secondary | ICD-10-CM | POA: Diagnosis not present

## 2024-05-21 ENCOUNTER — Encounter: Payer: Self-pay | Admitting: Urology

## 2024-05-21 ENCOUNTER — Ambulatory Visit (INDEPENDENT_AMBULATORY_CARE_PROVIDER_SITE_OTHER): Admitting: Urology

## 2024-05-21 VITALS — BP 170/86 | HR 65

## 2024-05-21 DIAGNOSIS — R351 Nocturia: Secondary | ICD-10-CM | POA: Diagnosis not present

## 2024-05-21 DIAGNOSIS — R339 Retention of urine, unspecified: Secondary | ICD-10-CM

## 2024-05-21 DIAGNOSIS — N200 Calculus of kidney: Secondary | ICD-10-CM | POA: Diagnosis not present

## 2024-05-21 DIAGNOSIS — N401 Enlarged prostate with lower urinary tract symptoms: Secondary | ICD-10-CM

## 2024-05-21 DIAGNOSIS — R338 Other retention of urine: Secondary | ICD-10-CM | POA: Diagnosis not present

## 2024-05-21 MED ORDER — FINASTERIDE 5 MG PO TABS: 5.0000 mg | ORAL_TABLET | Freq: Every day | ORAL | 3 refills | Status: AC

## 2024-05-21 NOTE — Progress Notes (Signed)
 05/21/2024 3:29 PM   [name] Jul 24, 1941 [id number]  Referring provider: [name], MD [address] [place],  [place] [address]  Followup BPH.    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 He stopped uroxatral  10mg  since last visit. He underwent renal US  yesterday which showed a 1.5cm bladder calculus. He denies any worsening LUTS. No hematuria.    PMH: Past Medical History:  Diagnosis Date   Arthritis    History of colon polyps    removed with colonoscopy   History of kidney stones    Hypothyroidism    Seizures (HCC)    none in 20 yrs- controlled with meds(Dr. 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    Surgical History: Past Surgical History:  Procedure Laterality Date   CHOLECYSTECTOMY  07/25/2012   Procedure: LAPAROSCOPIC CHOLECYSTECTOMY WITH INTRAOPERATIVE CHOLANGIOGRAM;  Surgeon: Lynda Leos, MD;  Location: [facility];  Service: General;;   COLONOSCOPY  11/15/2011   Procedure: COLONOSCOPY;  Surgeon: Claudis RAYMOND Rivet, MD;  Location: [facility];  Service: Endoscopy;  Laterality: N/A;  730   COLONOSCOPY WITH PROPOFOL  N/A 07/11/2022   Procedure: COLONOSCOPY WITH PROPOFOL ;  Surgeon: Eartha Angelia Sieving, MD;  Location: [facility];  Service: Gastroenterology;  Laterality: N/A;  730 ASA 3   CYSTOSCOPY WITH LITHOLAPAXY  08/09/2023   Procedure: CYSTOSCOPY WITH LITHOLAPAXY;  Surgeon: Sherrilee Belvie CROME, MD;  Location: [facility];  Service: Urology;;   ELBOW SURGERY     chipped bone right elbow   HOLMIUM LASER APPLICATION  08/09/2023   Procedure: HOLMIUM LASER APPLICATION;  Surgeon: Sherrilee Belvie CROME, MD;  Location: [facility];  Service: Urology;;   POLYPECTOMY  07/11/2022   Procedure: POLYPECTOMY INTESTINAL;  Surgeon: Eartha Angelia Sieving, MD;  Location: [facility];  Service: Gastroenterology;;   TONSILLECTOMY     TOTAL KNEE ARTHROPLASTY Left  03/04/2024   Procedure: ARTHROPLASTY, KNEE, TOTAL;  Surgeon: [name], MD;  Location: [facility];  Service: Orthopedics;  Laterality: Left;   TRANSURETHRAL RESECTION OF PROSTATE N/A 08/09/2023   Procedure: TRANSURETHRAL RESECTION OF THE PROSTATE (TURP);  Surgeon: Sherrilee Belvie CROME, MD;  Location: [facility];  Service: Urology;  Laterality: N/A;   VASECTOMY      Home Medications:  Allergies as of 05/21/2024       Reactions   Sulfa  Antibiotics Hives, Rash        Medication List        Accurate as of May 21, 2024  3:29 PM. If you have any questions, ask your nurse or doctor.          acetaminophen  500 MG tablet Commonly known as: TYLENOL  Take 1 tablet (500 mg total) by mouth every 6 (six) hours.   alfuzosin  10 MG 24 hr tablet Commonly known as: UROXATRAL  Take 1 tablet (10 mg total) by mouth daily with breakfast.   aspirin  EC 325 MG tablet Take 1 tablet (325 mg total) by mouth daily.   bisacodyl  5 MG EC tablet Commonly known as: DULCOLAX Take 1 tablet (5 mg total) by mouth daily as needed for moderate constipation.   finasteride  5 MG tablet Commonly known as: PROSCAR  Take 1 tablet (5 mg total) by mouth daily.   levothyroxine  75 MCG tablet Commonly known as: SYNTHROID  Take 75 mcg by mouth daily before breakfast.   methocarbamol  500 MG tablet Commonly known as:  ROBAXIN  Take 1 tablet (500 mg total) by mouth every 6 (six) hours as needed for muscle spasms.   metroNIDAZOLE  1 % gel Commonly known as: METROGEL  Apply 1 Application topically at bedtime.   multivitamin with minerals Tabs tablet Take 1 tablet by mouth daily.   naproxen  sodium 220 MG tablet Commonly known as: ALEVE  Take 1 tablet (220 mg total) by mouth 2 (two) times daily with a meal.   Oxcarbazepine  300 MG tablet Commonly known as: TRILEPTAL  Take 300 mg by mouth 2 (two) times daily.   polyethylene glycol 17 g packet Commonly known as: MIRALAX  / GLYCOLAX  Take 17 g by mouth daily as needed  for mild constipation.   sodium chloride  0.65 % nasal spray Commonly known as: OCEAN Place 1 spray into the nose daily as needed for congestion.   zonisamide  100 MG capsule Commonly known as: ZONEGRAN  Take 400 mg by mouth in the morning.        Allergies:  Allergies  Allergen Reactions   Sulfa  Antibiotics Hives and Rash    Family History: Family History  Family history unknown: Yes    Social History:  reports that he quit smoking about 41 years ago. His smoking use included cigarettes. He has never used smokeless tobacco. He reports current alcohol  use of about 1.0 standard drink of alcohol  per week. He reports that he does not use drugs.  ROS: All other review of systems were reviewed and are negative except what is noted above in HPI  Physical Exam: BP (!) 170/86   Pulse 65   Constitutional:  Alert and oriented, No acute distress. HEENT: [place] AT, moist mucus membranes.  Trachea midline, no masses. Cardiovascular: No clubbing, cyanosis, or edema. Respiratory: Normal respiratory effort, no increased work of breathing. GI: Abdomen is soft, nontender, nondistended, no abdominal masses GU: No CVA tenderness.  Lymph: No cervical or inguinal lymphadenopathy. Skin: No rashes, bruises or suspicious lesions. Neurologic: Grossly intact, no focal deficits, moving all 4 extremities. Psychiatric: Normal mood and affect.  Laboratory Data: Lab Results  Component Value Date   WBC 8.3 03/05/2024   HGB 10.4 (L) 03/05/2024   HCT 30.2 (L) 03/05/2024   MCV 91.2 03/05/2024   PLT 204 03/05/2024    Lab Results  Component Value Date   CREATININE 1.05 03/05/2024    No results found for: PSA  No results found for: TESTOSTERONE  No results found for: HGBA1C  Urinalysis    Component Value Date/Time   COLORURINE RED (A) 03/28/2023 0134   APPEARANCEUR Clear 02/21/2024 1503   LABSPEC 1.006 03/28/2023 0134   PHURINE 7.0 03/28/2023 0134   GLUCOSEU Negative 02/21/2024 1503    HGBUR MODERATE (A) 03/28/2023 0134   BILIRUBINUR Negative 02/21/2024 1503   KETONESUR NEGATIVE 03/28/2023 0134   PROTEINUR Negative 02/21/2024 1503   PROTEINUR 100 (A) 03/28/2023 0134   NITRITE Negative 02/21/2024 1503   NITRITE NEGATIVE 03/28/2023 0134   LEUKOCYTESUR 1+ (A) 02/21/2024 1503   LEUKOCYTESUR NEGATIVE 03/28/2023 0134    Lab Results  Component Value Date   LABMICR See below: 02/21/2024   WBCUA 6-10 (A) 02/21/2024   LABEPIT 0-10 02/21/2024   MUCUS Present 12/12/2021   BACTERIA None seen 02/21/2024    Pertinent Imaging: Renal US  05/20/24: Images reviewed and discussed with the patient  Results for orders placed during the hospital encounter of 11/22/23  DG Abd 1 View  Narrative CLINICAL DATA:  History of kidney stones.  EXAM: ABDOMEN - 1 VIEW  COMPARISON:  CT 04/18/2023  FINDINGS: Left renal calculi, largest measuring 6 mm in the region of the mid kidney. Some of the renal stones on prior CT are not demonstrated by radiograph. No right renal stones. Normal bowel gas pattern, moderate colonic stool burden. Cholecystectomy clips in the right upper quadrant.  IMPRESSION: Left renal calculi, largest measuring 6 mm.   Electronically Signed By: Andrea Gasman M.D. On: 12/02/2023 23:59  No results found for this or any previous visit.  No results found for this or any previous visit.  No results found for this or any previous visit.  Results for orders placed during the hospital encounter of 11/22/23  US  RENAL  Narrative CLINICAL DATA:  Nephrolithiasis.  EXAM: RENAL / URINARY TRACT ULTRASOUND COMPLETE  COMPARISON:  CT abdomen and pelvis April 18, 2023  FINDINGS: Right Kidney:  Renal measurements: 10.7 x 5.1 x 5.3 cm = volume: 150.5 mL. Mild increased echotexture. No hydronephrosis visualized. Right kidney cysts are identified, largest measures 2.3 x 2.1 x 2.1 cm. No follow-up is recommended.  Left Kidney:  Renal measurements: 12  x 6.5 x 5.4 cm = volume: 220.8 mL. Normal echotexture. Mild hydronephrosis. 11 mm nonobstructing stone in the left kidney. There is a 1.4 x 1.3 x 1.7 cm simple cyst in the pole left kidney, no follow-up is recommended.  Bladder:  Appears normal for degree of bladder distention.  Other:  None.  IMPRESSION: 1. Mild left hydronephrosis. 2. 11 mm nonobstructing stone in the left kidney. 3. Mild increased echotexture of the right kidney, nonspecific but can be seen in medical renal disease.   Electronically Signed By: Craig Farr M.D. On: 11/22/2023 16:50  No results found for this or any previous visit.  No results found for this or any previous visit.  Results for orders placed during the hospital encounter of 04/18/23  CT RENAL STONE STUDY  Narrative CLINICAL DATA:  Flank pain, hematuria.  EXAM: CT ABDOMEN AND PELVIS WITHOUT CONTRAST  TECHNIQUE: Multidetector CT imaging of the abdomen and pelvis was performed following the standard protocol without IV contrast.  RADIATION DOSE REDUCTION: This exam was performed according to the departmental dose-optimization program which includes automated exposure control, adjustment of the mA and/or kV according to patient size and/or use of iterative reconstruction technique.  COMPARISON:  06/13/2021.  FINDINGS: Lower chest: Lung bases clear.  No pleural or pericardial effusion.  Hepatobiliary: No focal liver abnormality is seen. Status post cholecystectomy. No biliary dilatation.  Pancreas: Unremarkable. No pancreatic ductal dilatation or surrounding inflammatory changes.  Spleen: Normal in size without focal abnormality.  Adrenals/Urinary Tract: Numerous bilateral renal lesions consistent with cysts the largest 1.9 cm on the right. Intrarenal stones left kidney measures 8 mm. No hydronephrosis. 5 mm calcification consistent with stone noted in the bladder.  Stomach/Bowel: Stomach is within normal limits. 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 Appendix appears normal. No evidence of bowel wall thickening, distention, or inflammatory changes. Diverticulosis noted of the descending and sigmoid colon.  Vascular/Lymphatic: Aortic atherosclerosis. Right inguinal adenopathy identified with a 2.2 cm asymmetrically prominent node on the right. This is a stable finding.  Reproductive: Marked prostatic calcifications are noted. Prostate is prominent.  Other: Small bilateral inguinal hernias containing omental fat. Anterior abdominal wall periumbilical defect containing a loop of bowel without suggestion of incarceration or obstruction.  Musculoskeletal: Thoracolumbosacral degenerative changes.  IMPRESSION: 1. Duodenal diverticulum. 2. Bilateral renal cysts. 3. Left-sided nephrolithiasis without hydronephrosis. 4. Abdominal wall defect containing loop of bowel  without incarceration or obstruction. Small bilateral inguinal hernias containing fat. 5. Diverticulosis. 6. Prominent prostate calcifications. 7. Bladder stone without hydronephrosis. 8. Right inguinal adenopathy, a stable finding. 9. Thoracolumbosacral degenerative changes. 10. Atheromatous disease.   Electronically Signed By: Fonda Field M.D. On: 05/07/2023 21:42   Assessment & Plan:    1. Nephrolithiasis (Primary) -followup 6 months with a renal US  - Urinalysis, Routine w reflex microscopic - Urine Culture; Future  2. Benign prostatic hyperplasia with nocturia Continue finasteride  5mg  daily  3. Incomplete emptying of bladder Continue finasteride  5mg  daily  4. Bladder calculus -patient defers therapy at this time   No follow-ups on file.  Belvie Clara, MD  [facility] [place]

## 2024-05-21 NOTE — Patient Instructions (Signed)
Bladder Stone  A bladder stone is a buildup of crystals made from the proteins and minerals found in urine. These substances build up when urine becomes too concentrated. Urine is concentrated when there is less water and more proteins and minerals in it. Bladder stones usually develop when a person has another medical condition that prevents the bladder from emptying completely. Crystals can form in the small amount of urine that is left in the bladder. Bladder stones that grow large can become painful and may block the flow of urine. What are the causes? This condition may be caused by: An enlarged prostate, which prevents the bladder from emptying well. An infection of a part of your urinary system (urinary tract infection, or UTI). This includes the: Kidneys. Bladder. Ureters. These are the tubes that carry urine to your bladder. Urethra. This is the tube that drains urine from your bladder. A weak spot in the bladder that creates a small pouch (bladder diverticulum). Nerve damage that may interfere with the signals from your brain to your bladder muscles (neurogenic bladder). This can result from conditions such as Parkinson's disease or spinal cord injuries. What increases the risk? This condition is more likely to develop in people who: Get frequent UTIs. Have another medical condition that affects the bladder. Have a history of bladder surgery. Have a spinal cord injury. Have an abnormal shape of the bladder (deformity). What are the signs or symptoms? Common symptoms of this condition include: Pain in the abdomen. A need to urinate more often. Difficulty or pain when urinating. Blood in the urine. Cloudy urine or urine that is dark in color. Pain in the penis or testicles in men. Small bladder stones do not always cause symptoms. How is this diagnosed? This condition may be diagnosed based on your symptoms, medical history, and physical exam. The physical exam will check for  tenderness in your abdomen. For men, an exam in the rectum may be done to check the prostate gland. You may have tests, such as: A urine test (urinalysis). A urine sample test to check for other infections (culture). Blood tests, including tests to look for a certain substance (creatinine). A creatinine level that is higher than normal could indicate a blockage. A procedure to check your bladder using a scope with a camera (cystoscopy). You may also have imaging studies, such as: CT scan or ultrasound of your abdomen and the area between your hip bones (pelvis or pelvic area). An X-ray of your urinary system. How is this treated? This condition may be treated with: Cystolitholapaxy. This procedure uses a laser, ultrasound, or other device to break the stone into smaller pieces. Fluids are used to flush the small pieces from the area. Surgery to remove the stone. A stent. This is a small mesh tube that is threaded into your ureter to make urine flow. Medicines to treat pain. Follow these instructions at home: Medicines Take over-the-counter and prescription medicines only as told by your health care provider. Ask your health care provider if the medicine prescribed to you: Requires you to avoid driving or using heavy machinery. Can cause constipation. You may need to take these actions to prevent or treat constipation: Take over-the-counter or prescription medicines. Eat foods that are high in fiber, such as beans, whole grains, and fresh fruits and vegetables. Limit foods that are high in fat and processed sugars, such as fried or sweet foods. Alcohol use Do not drink alcohol if: Your health care provider tells you not to  drink. You are pregnant, may be pregnant, or are planning to become pregnant. If you drink alcohol: Limit how much you drink to: 0-1 drink a day for women. 0-2 drinks a day for men. Be aware of how much alcohol is in your drink. In the [place] one drink equals one 12  oz bottle of beer (355 mL), one 5 oz glass of wine (148 mL), or one 1 oz glass of hard liquor (44 mL). Activity Rest as told by your health care provider. Return to your normal activities as told by your health care provider. Ask your health care provider what activities are safe for you. General instructions  Drink enough fluid to keep your urine pale yellow. Tell your health care provider about any unusual symptoms related to urinating. Early diagnosis of an enlarged prostate and other bladder conditions may reduce your risk of getting bladder stones. Do not use any products that contain nicotine or tobacco, such as cigarettes, e-cigarettes, or chewing tobacco. If you need help quitting, ask your health care provider. Do not use drugs. Where to find more information [facility]): www.[facility].org Contact a health care provider if you: Have a fever. Feel nauseous or vomit. Are unable to urinate. Have a large amount of blood in your urine. Get help right away if you: Have severe back pain or pain in the lower part of your abdomen. Cannot eat or drink without vomiting. Vomit after taking your medicine. Summary A bladder stone is a buildup of crystals made from the proteins and minerals found in urine. These substances build up when urine becomes too concentrated. Bladder stones that grow large can become painful and may block the flow of urine. Bladder stones may be treated with a laser, a stent, surgery, or pain medicines. This information is not intended to replace advice given to you by your health care provider. Make sure you discuss any questions you have with your health care provider. Document Revised: 06/12/2022 Document Reviewed: 06/12/2022 Elsevier Patient Education  2024 [employer].

## 2024-05-22 LAB — URINALYSIS, ROUTINE W REFLEX MICROSCOPIC
Bilirubin, UA: NEGATIVE
Glucose, UA: NEGATIVE
Ketones, UA: NEGATIVE
Nitrite, UA: POSITIVE — AB
Specific Gravity, UA: 1.01 (ref 1.005–1.030)
Urobilinogen, Ur: 1 mg/dL (ref 0.2–1.0)
pH, UA: 7 (ref 5.0–7.5)

## 2024-05-22 LAB — MICROSCOPIC EXAMINATION

## 2024-05-23 ENCOUNTER — Ambulatory Visit: Payer: Self-pay

## 2024-05-23 LAB — URINE CULTURE

## 2024-06-16 ENCOUNTER — Ambulatory Visit: Admitting: Urology

## 2024-06-17 DIAGNOSIS — Z130 Encounter for screening for diseases of the blood and blood-forming organs and certain disorders involving the immune mechanism: Secondary | ICD-10-CM | POA: Diagnosis not present

## 2024-06-17 DIAGNOSIS — R739 Hyperglycemia, unspecified: Secondary | ICD-10-CM | POA: Diagnosis not present

## 2024-08-12 ENCOUNTER — Other Ambulatory Visit: Payer: Self-pay | Admitting: Orthopedic Surgery

## 2024-08-12 MED ORDER — CEPHALEXIN 500 MG PO CAPS: 2000.0000 mg | ORAL_CAPSULE | Freq: Once | ORAL | 2 refills | Status: AC

## 2024-11-06 ENCOUNTER — Other Ambulatory Visit (HOSPITAL_COMMUNITY)

## 2024-11-21 ENCOUNTER — Ambulatory Visit: Admitting: Urology

## 2025-03-05 ENCOUNTER — Ambulatory Visit: Admitting: Orthopedic Surgery
